# Patient Record
Sex: Female | Born: 1945
Health system: Southern US, Community
[De-identification: ages and names within clinical notes are randomized; demographics above are authoritative.]

## PROBLEM LIST (undated history)

## (undated) DIAGNOSIS — K219 Gastro-esophageal reflux disease without esophagitis: Secondary | ICD-10-CM

## (undated) DIAGNOSIS — E785 Hyperlipidemia, unspecified: Secondary | ICD-10-CM

## (undated) DIAGNOSIS — R0609 Other forms of dyspnea: Secondary | ICD-10-CM

## (undated) DIAGNOSIS — M810 Age-related osteoporosis without current pathological fracture: Secondary | ICD-10-CM

## (undated) DIAGNOSIS — K754 Autoimmune hepatitis: Secondary | ICD-10-CM

## (undated) DIAGNOSIS — K759 Inflammatory liver disease, unspecified: Secondary | ICD-10-CM

## (undated) DIAGNOSIS — I10 Essential (primary) hypertension: Secondary | ICD-10-CM

## (undated) DIAGNOSIS — R945 Abnormal results of liver function studies: Secondary | ICD-10-CM

## (undated) DIAGNOSIS — R7989 Other specified abnormal findings of blood chemistry: Secondary | ICD-10-CM

## (undated) DIAGNOSIS — R0600 Dyspnea, unspecified: Secondary | ICD-10-CM

## (undated) HISTORY — PX: LIVER BIOPSY: SHX301

## (undated) HISTORY — PX: ABDOMINAL HYSTERECTOMY: SHX81

---
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
## 2005-05-10 ENCOUNTER — Ambulatory Visit: Payer: Self-pay | Admitting: Obstetrics and Gynecology

## 2005-08-08 ENCOUNTER — Ambulatory Visit: Payer: Self-pay | Admitting: Unknown Physician Specialty

## 2006-06-22 ENCOUNTER — Ambulatory Visit: Payer: Self-pay | Admitting: Obstetrics and Gynecology

## 2007-07-03 ENCOUNTER — Ambulatory Visit: Payer: Self-pay | Admitting: Obstetrics and Gynecology

## 2008-09-23 ENCOUNTER — Ambulatory Visit: Payer: Self-pay | Admitting: Obstetrics and Gynecology

## 2009-06-22 ENCOUNTER — Ambulatory Visit: Payer: Self-pay | Admitting: Internal Medicine

## 2009-10-26 ENCOUNTER — Ambulatory Visit: Payer: Self-pay | Admitting: Obstetrics and Gynecology

## 2010-12-02 ENCOUNTER — Ambulatory Visit: Payer: Self-pay | Admitting: Obstetrics and Gynecology

## 2011-12-13 ENCOUNTER — Ambulatory Visit: Payer: Self-pay | Admitting: Obstetrics and Gynecology

## 2013-02-20 ENCOUNTER — Ambulatory Visit: Payer: Self-pay | Admitting: Obstetrics and Gynecology

## 2015-10-06 HISTORY — PX: COLONOSCOPY: SHX174

## 2016-07-18 DIAGNOSIS — K754 Autoimmune hepatitis: Secondary | ICD-10-CM

## 2016-07-18 HISTORY — DX: Autoimmune hepatitis: K75.4

## 2016-10-20 ENCOUNTER — Other Ambulatory Visit: Payer: Self-pay | Admitting: Internal Medicine

## 2016-10-20 DIAGNOSIS — Z1231 Encounter for screening mammogram for malignant neoplasm of breast: Secondary | ICD-10-CM

## 2016-11-15 ENCOUNTER — Encounter: Payer: Self-pay | Admitting: Radiology

## 2016-11-15 ENCOUNTER — Ambulatory Visit
Admission: RE | Admit: 2016-11-15 | Discharge: 2016-11-15 | Disposition: A | Discharge status: Home / Self Care | Payer: Medicare Other | Source: Ambulatory Visit | Attending: Internal Medicine | Admitting: Internal Medicine

## 2016-11-15 DIAGNOSIS — Z1231 Encounter for screening mammogram for malignant neoplasm of breast: Secondary | ICD-10-CM | POA: Diagnosis not present

## 2017-02-13 ENCOUNTER — Other Ambulatory Visit: Payer: Self-pay | Admitting: Nurse Practitioner

## 2017-02-13 DIAGNOSIS — K7689 Other specified diseases of liver: Secondary | ICD-10-CM

## 2017-02-13 DIAGNOSIS — R748 Abnormal levels of other serum enzymes: Secondary | ICD-10-CM

## 2017-02-13 DIAGNOSIS — M810 Age-related osteoporosis without current pathological fracture: Secondary | ICD-10-CM | POA: Insufficient documentation

## 2017-02-14 ENCOUNTER — Ambulatory Visit
Admission: RE | Admit: 2017-02-14 | Discharge: 2017-02-14 | Disposition: A | Discharge status: Home / Self Care | Payer: Medicare Other | Source: Ambulatory Visit | Attending: Nurse Practitioner | Admitting: Nurse Practitioner

## 2017-02-14 ENCOUNTER — Other Ambulatory Visit: Payer: Self-pay | Admitting: Nurse Practitioner

## 2017-02-14 DIAGNOSIS — R748 Abnormal levels of other serum enzymes: Secondary | ICD-10-CM

## 2017-02-14 DIAGNOSIS — R8290 Unspecified abnormal findings in urine: Secondary | ICD-10-CM | POA: Diagnosis present

## 2017-02-14 DIAGNOSIS — K7689 Other specified diseases of liver: Secondary | ICD-10-CM | POA: Insufficient documentation

## 2017-02-14 DIAGNOSIS — K824 Cholesterolosis of gallbladder: Secondary | ICD-10-CM | POA: Insufficient documentation

## 2017-02-14 DIAGNOSIS — N281 Cyst of kidney, acquired: Secondary | ICD-10-CM | POA: Diagnosis not present

## 2017-02-17 ENCOUNTER — Other Ambulatory Visit: Payer: Self-pay | Admitting: Nurse Practitioner

## 2017-02-17 ENCOUNTER — Ambulatory Visit
Admission: RE | Admit: 2017-02-17 | Discharge: 2017-02-17 | Disposition: A | Discharge status: Home / Self Care | Payer: Medicare Other | Source: Ambulatory Visit | Attending: Nurse Practitioner | Admitting: Nurse Practitioner

## 2017-02-17 DIAGNOSIS — K7689 Other specified diseases of liver: Secondary | ICD-10-CM

## 2017-02-17 DIAGNOSIS — R748 Abnormal levels of other serum enzymes: Secondary | ICD-10-CM | POA: Insufficient documentation

## 2017-02-17 DIAGNOSIS — K862 Cyst of pancreas: Secondary | ICD-10-CM | POA: Insufficient documentation

## 2017-02-17 DIAGNOSIS — K449 Diaphragmatic hernia without obstruction or gangrene: Secondary | ICD-10-CM

## 2017-02-17 MED ORDER — GADOBENATE DIMEGLUMINE 529 MG/ML IV SOLN
10.0000 mL | Freq: Once | INTRAVENOUS | Status: AC | PRN
  Administered 2017-02-17: 7 mL via INTRAVENOUS

## 2017-02-20 ENCOUNTER — Inpatient Hospital Stay
Admission: AD | Admit: 2017-02-20 | Discharge: 2017-02-22 | DRG: 442 | Disposition: A | Discharge status: Home / Self Care | Payer: Medicare Other | Source: Ambulatory Visit | Attending: Internal Medicine | Admitting: Internal Medicine

## 2017-02-20 ENCOUNTER — Ambulatory Visit: Payer: Medicare Other

## 2017-02-20 ENCOUNTER — Other Ambulatory Visit: Payer: Self-pay | Admitting: Nurse Practitioner

## 2017-02-20 DIAGNOSIS — R748 Abnormal levels of other serum enzymes: Secondary | ICD-10-CM

## 2017-02-20 DIAGNOSIS — K862 Cyst of pancreas: Secondary | ICD-10-CM | POA: Diagnosis present

## 2017-02-20 DIAGNOSIS — M810 Age-related osteoporosis without current pathological fracture: Secondary | ICD-10-CM | POA: Diagnosis present

## 2017-02-20 DIAGNOSIS — R319 Hematuria, unspecified: Secondary | ICD-10-CM | POA: Diagnosis present

## 2017-02-20 DIAGNOSIS — Z7982 Long term (current) use of aspirin: Secondary | ICD-10-CM

## 2017-02-20 DIAGNOSIS — R945 Abnormal results of liver function studies: Secondary | ICD-10-CM | POA: Diagnosis present

## 2017-02-20 DIAGNOSIS — Z87440 Personal history of urinary (tract) infections: Secondary | ICD-10-CM | POA: Diagnosis not present

## 2017-02-20 DIAGNOSIS — E876 Hypokalemia: Secondary | ICD-10-CM | POA: Diagnosis present

## 2017-02-20 DIAGNOSIS — D696 Thrombocytopenia, unspecified: Secondary | ICD-10-CM | POA: Diagnosis present

## 2017-02-20 DIAGNOSIS — K7689 Other specified diseases of liver: Secondary | ICD-10-CM | POA: Diagnosis present

## 2017-02-20 DIAGNOSIS — K754 Autoimmune hepatitis: Principal | ICD-10-CM | POA: Diagnosis present

## 2017-02-20 DIAGNOSIS — I10 Essential (primary) hypertension: Secondary | ICD-10-CM | POA: Diagnosis present

## 2017-02-20 DIAGNOSIS — Z882 Allergy status to sulfonamides status: Secondary | ICD-10-CM | POA: Diagnosis not present

## 2017-02-20 DIAGNOSIS — R7989 Other specified abnormal findings of blood chemistry: Secondary | ICD-10-CM | POA: Diagnosis present

## 2017-02-20 HISTORY — DX: Essential (primary) hypertension: I10

## 2017-02-20 LAB — COMPREHENSIVE METABOLIC PANEL
ALT: 365 U/L — AB (ref 14–54)
AST: 862 U/L — AB (ref 15–41)
Albumin: 3.1 g/dL — ABNORMAL LOW (ref 3.5–5.0)
Alkaline Phosphatase: 88 U/L (ref 38–126)
Anion gap: 8 (ref 5–15)
BILIRUBIN TOTAL: 7.9 mg/dL — AB (ref 0.3–1.2)
BUN: 22 mg/dL — AB (ref 6–20)
CHLORIDE: 108 mmol/L (ref 101–111)
CO2: 25 mmol/L (ref 22–32)
CREATININE: 1.13 mg/dL — AB (ref 0.44–1.00)
Calcium: 9.5 mg/dL (ref 8.9–10.3)
GFR, EST AFRICAN AMERICAN: 55 mL/min — AB (ref >60)
GFR, EST NON AFRICAN AMERICAN: 48 mL/min — AB (ref >60)
Glucose, Bld: 119 mg/dL — ABNORMAL HIGH (ref 65–99)
POTASSIUM: 3.5 mmol/L (ref 3.5–5.1)
Sodium: 141 mmol/L (ref 135–145)
TOTAL PROTEIN: 8 g/dL (ref 6.5–8.1)

## 2017-02-20 MED ORDER — ACETAMINOPHEN 650 MG RE SUPP: 650.0000 mg | Freq: Four times a day (QID) | RECTAL | Status: DC | PRN

## 2017-02-20 MED ORDER — ONDANSETRON HCL 4 MG PO TABS: 4.0000 mg | ORAL_TABLET | Freq: Four times a day (QID) | ORAL | Status: DC | PRN

## 2017-02-20 MED ORDER — TRAZODONE HCL 50 MG PO TABS
25.0000 mg | ORAL_TABLET | Freq: Every evening | ORAL | Status: DC | PRN
  Administered 2017-02-20: 25 mg via ORAL
  Filled 2017-02-20: qty 1

## 2017-02-20 MED ORDER — HYDROCODONE-ACETAMINOPHEN 5-325 MG PO TABS
1.0000 | ORAL_TABLET | ORAL | Status: DC | PRN
  Administered 2017-02-21: 1 via ORAL
  Filled 2017-02-20: qty 1

## 2017-02-20 MED ORDER — DOCUSATE SODIUM 100 MG PO CAPS
100.0000 mg | ORAL_CAPSULE | Freq: Two times a day (BID) | ORAL | Status: DC
  Administered 2017-02-20 – 2017-02-22 (×3): 100 mg via ORAL
  Filled 2017-02-20 (×3): qty 1

## 2017-02-20 MED ORDER — ACETAMINOPHEN 325 MG PO TABS: 650.0000 mg | ORAL_TABLET | Freq: Four times a day (QID) | ORAL | Status: DC | PRN

## 2017-02-20 MED ORDER — ONDANSETRON HCL 4 MG/2ML IJ SOLN
4.0000 mg | Freq: Four times a day (QID) | INTRAMUSCULAR | Status: DC | PRN
  Administered 2017-02-21: 4 mg via INTRAVENOUS
  Filled 2017-02-20: qty 2

## 2017-02-20 NOTE — Consult Note (Signed)
GASTROENTEROLOGY ESTABLISHED PATIENT VISIT   Patient Profile: [name] [name] is a 71 y.o. female who presents for a return visit to the GI clinic for follow up of elevated liver enzymes, abnormal serologic evaluation, and abnormal MRI of liver/pancreas.  PCP:  Margaretann Loveless, MD  Brief History   Outside records: - 04/11/16: AST 17, ALT 14, ALP 57, T bili 0.7, albumin 4.2. - 10/12/16:TSH 1.53  - 01/19/17: AST 571, ALT 475, ALP 100, T bili 1.5, albumin 3.9, BUN 22, Cr 0.99, NA 142, K3.8, Ca9.3; hepatitis a antibody IgM negative, hepatitis B surface antigen screen negative, hepatitis B core antibody IgM negative, hepatitis C virus AB neg, total cholesterol 124 was writes 124 HDL 31 LDL 68. - 01/26/17: AST 619, ALT 420, ALP 109, GGT 441, T bili 3.2, LDH 339. - 02/10/17: AST 524,ALT 268, ALP 104, GGT 490, T bili 5.2 (total 4.9, direct 2.98, indirect 1.92), LDH 302.  [facility]: - 02/13/17: CC: abnormal LFTs. Mild nausea w/o vomiting and red urine noted.  Last CSY in 07/2005 - unremarkable.              - Pertinent labs : AST 505, ALT 249, T bili 6.7, conjugated bili 4.2, albumin 3.4, INR 1.3, ASMA 78, AMA 126.9, total IgG 2320, serum IgA 694, total IgE 264, AFP 56.2, Cr 1.3, GFR 40; UA + protein (100), glucose (100), ketones (15), moderate blood, + nitrite, moderate leuk esterase, + WBCs (>50), + RBCs (4-10), + bacteria (moderate), culture + E. Coli.              - Normal labs: HBV panel. - 02/14/17: [place] abd complete w/ doppler - several hepatic cysts (some simple, at least 1 with solid element), multiple gallbladder polyps (largest 8mm, indeterminant), borderline splenomegaly, ectatitc abdominal aorta (max diameter 2.6 cm); recommend MRI and repeat US in 5 years for aorta. - 02/15/17: Pertinent labs: platelets 135, ammonia 132, CA 19-9 220; ANA              - Normal labs: BMP, ANA, ceruloplasmin, alpha-1-antitripysin phenotype, lipase. - 02/17/17: MRI abdomen w/ MRCP - numerous benign-appearing  hepatic cysts, 1.7cm simple cyst in posterior pancreatic head (ddx: indolent cystic neoplasm like IPMN/pseudocyst), moderate hiatal hernia, probably tiny gallstone; repeat MRI in 6 months recommended to f/up pancreatic cyst.  Interval History   [name] returns for follow-up of elevated LFTs.  STAT labs were obtained prior to today's visit:  - AST 719, ALT 344, T bili 7.4, conjugated bili 4.4, albumin 3.2. - K 3.3, GFR 55. - INR 1.3. - Platelets 145.  Patient denies a known personal or family history of liver disease.  FHx is notable for gastric ulcer in father, s/p gastrectomy, due to EtOH abuse.  No significant EtOH use or tobacco use.  Patient previously took acetaminophen as needed for headaches on a very rare basis.  She also discontinued vitamin B, C, and D supplementation when LFTs initially became elevated.  One BP medication (unsure of name) was discontinued after she lost 5 lbs intentionally.  Otherwise, no new medications, including antibiotics, or OTC supplements.  No recent illnesses within the last 2-3 months.   Also no history of IV drug use, intranasal cocaine use, tattoos, incarceration, or prior blood transfusions prior to 1992.  No liver-related symptoms including abdominal swelling, LE swelling, jaundice, itching, confusion, rectal bleeding, and dark stools.  Patient notes mild fatigue, "cloudiness", and unsteadiness on her feet.  She also denies additional GI concerns like  unexplained weight loss, appetite changes, dysphagia, heartburn/reflux, vomiting, or change in bowel habits.   Problem List:         Problem List  Date Reviewed: 02/14/2017         Noted   Abnormal results of liver function studies 02/13/2017   Allergic rhinitis due to allergen 02/13/2017   Senile osteoporosis 02/13/2017   Mixed hyperlipidemia 02/13/2017   Essential hypertension 02/13/2017   History of hypertension 02/13/2017   Benign neoplasm of skin of trunk 02/13/2017       Medications:  CurrentMedications        Current Outpatient Prescriptions  Medication Sig Dispense Refill  . aspirin 81 MG EC tablet Take 81 mg by mouth once daily.    . benazepril (LOTENSIN) 40 MG tablet Take 40 mg by mouth once daily.    . metoprolol succinate (TOPROL-XL) 50 MG XL tablet Take 50 mg by mouth once daily.     No current facility-administered medications for this visit.        Allergies:  Sulfa (sulfonamide antibiotics)  Past Medical History:      Past Medical History:  Diagnosis Date  . Abnormal results of liver function studies   . Allergic rhinitis due to allergen   . Benign neoplasm of skin of trunk   . Bronchitis   . Essential hypertension   . History of hypertension   . Mixed hyperlipidemia   . Senile osteoporosis     Social History:  Social History        Social History  . Marital status: Married    Spouse name: N/A  . Number of children: N/A  . Years of education: N/A       Social History Main Topics  . Smoking status: Never Smoker  . Smokeless tobacco: Never Used  . Alcohol use No  . Drug use: No  . Sexual activity: Not Asked       Other Topics Concern  . None      Social History Narrative  . None    Family History:       Family History  Problem Relation Age of Onset  . Lung cancer Mother   . No Known Problems Daughter      Review of Systems   General: negative for - fever, chills, weight gain, weight loss, fatigue, weakness, depression, anxiety Head: no injury, migraines, headaches Eyes: no jaundice, itching, dryness, tearing, redness, vision changes Nose: no injury, bleeding Mouth/Throat: no oral ulcers, swollen neck, dry mouth, sore throat, hoarseness Endocrine: no heat/cold intolerance Respiratory: no cough, wheezing, SOB Cardiovascular: no chest pain, palpitations GI: see HPI Musculoskeletal: no joint swelling, muscle/joint pain  Neurological: no seizures,  syncope, dizziness, numbness/tingling Skin: no rashes, itching Hematological and Lymphatic: no easy bruising, easy bleeding   Physical Examination      Vitals:   02/20/17 1550  BP: (!) 173/98  Pulse: 73  Temp: 36.8 C (98.3 F)   Body mass index is 29.22 kg/m. Weight: 67.9 kg (149 lb 9.6 oz)      Wt Readings from Last 3 Encounters:  02/20/17 67.9 kg (149 lb 9.6 oz)  02/13/17 68.3 kg (150 lb 9.6 oz)    General Appearance:    Friendly, WDWN female no distress, sitting comfortably on exam room bench; husband at bedside.  Head:     Atraumatic, normocephalic  Eyes:   Anciteric  Neck:   No lymphadenopathy noted, symmetrical, no JVD  Mouth:   Lips, mucosa, and tongue normal;  Lungs:     Clear to auscultation bilaterally, no wheezes/crackles/rales   Heart:    Regular rate and rhythm, S1 and S2 normal, no murmur, rub   or gallop  Abdomen:  Rectal:     Soft, non-tender, bowel sounds active all four quadrants,         no rebound/guarding, no masses, no organomegaly   Deferred  Extremities:   Extremities normal, atraumatic, no cyanosis or edema  Skin:   + mild jaundice, + palmar erythema on ulner aspect of hands bilaterally, mild angioma formation along chest No abdominal rashes or lesions noted   Neurologic:   Moves all extremities well. Alert and oriented x 3.   Review of Data   I have reviewed the following data today:  Provider notes: Dr. Welton Flakes (PCP) Fransico Setters, NP (GI)  Labs:      Lab Results  Component Value Date   WBC 5.4 02/20/2017   HGB 13.9 02/20/2017   HCT 42.2 02/20/2017   MCV 87.6 02/20/2017   PLT 145 (L) 02/20/2017   @LABRCNTIP (hgb:3)@      Lab Results  Component Value Date   NA 139 02/20/2017   K 3.3 (L) 02/20/2017   CL 107 02/20/2017   CO2 25.5 02/20/2017   BUN 18 02/20/2017   CREATININE 1.0 02/20/2017        Lab Results  Component Value Date   ALT 344 (H) 02/20/2017   AST 719 (H) 02/20/2017   ALKPHOS 94  02/20/2017   @LABRCNTIP (aptt,inr,ptt)@  Imaging: MRI abdomen + MRCP w/ and w/o contrast - 02/17/17: FINDINGS: Lower chest: Tiny right pleural effusion.Moderate hiatal hernia.  Hepatobiliary: Numerous benign-appearing cysts are seen throughout the right and left lobes. No solid liver mass identified. No evidence of biliary ductal dilatation, with common bile duct measuring 4 mm. No evidence of choledocholithiasis or biliary stricture. Tiny sub-cm filling defect in the gallbladder is suspicious for a tiny gallstone.  Pancreas: No evidence of pancreatic ductal dilatation or pancreas divisum. A simple appearing cyst measuring 1.7 cm is seen in the posterior pancreatic head on image 16/5. No definite communication with main pancreatic duct visualized.  Spleen:Within normal limits in size and appearance.  Adrenals/Urinary Tract: No masses identified. Tiny sub-cm renal cysts noted. No evidence of hydronephrosis.  Stomach/Bowel: Visualized portions within the abdomen are unremarkable.  Vascular/Lymphatic: No pathologically enlarged lymph nodes identified. No abdominal aortic aneurysm.  Other:None.  Musculoskeletal:No suspicious bone lesions identified.  IMPRESSION: Numerous benign-appearing hepatic cysts. No evidence of hepatic mass or biliary ductal dilatation.  1.7 cm simple appearing cyst in posterior pancreatic head, with differential diagnosis including indolent cystic neoplasm, such as side-branch IPMN, and pseudocyst. Recommend continued followup by MRI in 6 months. This recommendation follows ACR consensus guidelines: Management of Incidental Pancreatic Cysts: A White Paper of the ACR Incidental Findings Committee. J Am Coll Radiol 2017;14:911-923.  Moderate hiatal hernia.  Probable tiny gallstone. No radiographic evidence of cholecystitis or biliary dilatation.  Procedures: See brief history.  Also see HPI.  Assessment/Plan   Ms. Wanninger is a 71  y.o. female with a PMHx of hypertension was seen today for follow up of:  1. Elevated LFTs Evaluation thus far is notable for persistently elevated AST (517 > 719) and ALT (473 > 344) since 01/19/17.  Previous LFTs in 09/2016 were WNL.  Pertinent findings in her serologic evaluation include elevated ASMA (78), AMA (126.9), total IgG (2,320), serum IgA (694), total IgE (264), and AFP (56.2).  US abdomen w/ doppler on  02/14/17 demonstrated several hepatic cysts with normal bile ducts and flow.  Subsequent MRI w/ MRCP noted a 1.7cm pancreatic cyst (see below), several hepatic cysts, and unremarkable bile ducts.  At this time, DDx includes autoimmune hepatitis vs bilary etiology like cholangiocarcinoma.  US-guided liver biopsy was recommended to obtain tissue diagnosis.  However, the first available outpatient appointment is Wednesday 8/15.  Therefore, Dr. Marva Panda recommends direct admission for more immediate evaluation given patient continued rise in LFTs, bilirubin, and ammonia.  Page has been placed on the on-call Internal Medicine physician for direct admission.  Will closely follow as outpatient once biopsy obtained and patient stable for discharge.  2. Elevated bilirubin See evaluation as noted above.  Total bilirubin has risen from 1.5 > 7.4, with conjugated bilirubin 2.98 > 4.40).  Aside from posterior pancreatic head cyst without side branch invasion, biliary system appeared normal on [place] w/ doppler and MRI w/ MRCP.  DDx as above.  3. Thrombocytopenia, unspecified (CMS-HCC) Platelets 135-145.  No overt bleeding.  Will continue to monitor.  4. Pancreatic cyst MRI w/ MRCP noted a 1.7cm pancreatic cyst without definite communication with main pancreatic duct.  This is most consistent with indolent cystic neoplasm like IPMN or pseudocyst.  CA 19-9 also elevated at 220.  Will continue to evaluate as above, and monitor pancreatic cyst in 6 months per  ACR consensus guidelines: Management of Incidental  Pancreatic Cysts: A White Paper of the ACR Incidental Findings Committee. J Am Coll Radiol 2017;14:911-923.  5. Hepatic encephalopathy Ammonia 132 on 02/15/17.  "Cloudiness", unsteadiness, and fatigue noted.  No asterixis.  - Outpatient management pending admission/liver biopsy findings.  6. UTI with hematuria UA obtained on 7/30 consistent with UTI.  Culture + E coli.  Patient was treated with a course of ampicillin x 3 days, but continues to report hematuria first thing in the morning.  Will discontinue ampicillin and defer to inpatient management.

## 2017-02-20 NOTE — Consult Note (Addendum)
Patient is a 71 yo female admitted from outpatient clinic with increasing liver enzyme elevations. Please see full GI consult by Ms Laural BenesJohnson.     Patient initially seen in the o/p clinic one week ago referred for evaluation  of abnormal lfts.  Extensive evaluation including multiple labs and MRI/MRCP done in the interim.  No apparent drug related reaction.  No previous liver history of hepatitis, family history of liver disease or cirrhosis, no travel.  Labs indicate elevated ASMA and AMA and IgG is elevated, with IgM normal as is ANA.  Abnormal CA19-9 although MRI shows no mass but possible side branch IPMT.  No evidence of obstruction.    Concern for autoimmune hepatitis.  Unable to get liver biopsy within the next 10-14 days as outpatient, admission for further evaluation and consideration of percutaneous liver biopsy.     [facility].  Case discussed with Dr Amado CoeGouru, IM hospitalist.

## 2017-02-20 NOTE — H&P (Addendum)
[facility] - [name] [name] Guerra at [facility]   PATIENT NAME: [name] [name]    MR#:  [id number]  DATE OF BIRTH:  [birth year]  DATE OF ADMISSION:  02/20/2017  PRIMARY CARE PHYSICIAN: [name] Loveless, MD   REQUESTING/REFERRING PHYSICIAN: Dr. Marva [name]  CHIEF COMPLAINT:  No chief complaint on file. Abnormal LFTs  HISTORY OF PRESENT ILLNESS:  [name] [name]  is a 71 y.o. female with a known history of Hypertension hypertension sent in from Dr. Marva [name] s office for liver biopsy. Patient referred to GI office for abnormal liver function testing. And patient was evaluated by endocrinology including hepatitis workup, MRCP which are essentially unremarkabl. And patient may possibly have autoimmune hepatitis for that she needs liver biopsy. We're admitting her for need for liver biopsy, intervention radiology to do a liver biopsy tomorrow morning. And gastroenterology will follow the patient. Patient denies any complaints except slight itching.  PAST MEDICAL HISTORY:  No past medical history on file.  PAST SURGICAL HISTOIRY:  No past surgical history on file.  SOCIAL HISTORY:   Social History  Substance Use Topics  . Smoking status: Not on file  . Smokeless tobacco: Not on file  . Alcohol use Not on file    FAMILY HISTORY:  No family history on file.  DRUG ALLERGIES:  Allergies not on file  REVIEW OF SYSTEMS:  CONSTITUTIONAL: No fever, fatigue or weakness.  EYES: No blurred or double vision.  EARS, NOSE, AND THROAT: No tinnitus or ear pain.  RESPIRATORY: No cough, shortness of breath, wheezing or hemoptysis.  CARDIOVASCULAR: No chest pain, orthopnea, edema.  GASTROINTESTINAL: No nausea, vomiting, diarrhea or abdominal pain.  GENITOURINARY: No dysuria, hematuria.  ENDOCRINE: No polyuria, nocturia,  HEMATOLOGY: No anemia, easy bruising or bleeding SKIN: No rash or lesion. MUSCULOSKELETAL: No joint pain or arthritis.   NEUROLOGIC: No tingling, numbness, weakness.   PSYCHIATRY: No anxiety or depression.   MEDICATIONS AT HOME:   Prior to Admission medications   Not on File      VITAL SIGNS:  There were no vitals taken for this visit.  PHYSICAL EXAMINATION:  GENERAL:  71 y.o.-year-old patient lying in the bed with no acute distress.  EYES: Pupils equal, round, reactive to light and accommodation. No scleral icterus. Extraocular muscles intact.  HEENT: Head atraumatic, normocephalic. Oropharynx and nasopharynx clear.  NECK:  Supple, no jugular venous distention. No thyroid enlargement, no tenderness.  LUNGS: Normal breath sounds bilaterally, no wheezing, rales,rhonchi or crepitation. No use of accessory muscles of respiration.  CARDIOVASCULAR: S1, S2 normal. No murmurs, rubs, or gallops.  ABDOMEN: Soft, nontender, nondistended. Bowel sounds present. No organomegaly or mass.  EXTREMITIES: No pedal edema, cyanosis, or clubbing.  NEUROLOGIC: Cranial nerves II through XII are intact. Muscle strength 5/5 in all extremities. Sensation intact. Gait not checked.  PSYCHIATRIC: The patient is alert and oriented x 3.  SKIN: No obvious rash, lesion, or ulcer.   LABORATORY PANEL:   CBC No results for input(s): WBC, HGB, HCT, PLT in the last 168 hours. ------------------------------------------------------------------------------------------------------------------  Chemistries  No results for input(s): NA, K, CL, CO2, GLUCOSE, BUN, CREATININE, CALCIUM, MG, AST, ALT, ALKPHOS, BILITOT in the last 168 hours.  Invalid input(s): GFRCGP ------------------------------------------------------------------------------------------------------------------  Cardiac Enzymes No results for input(s): TROPONINI in the last 168 hours. ------------------------------------------------------------------------------------------------------------------  RADIOLOGY:  No results found.  EKG:  No orders found for this or any previous visit.  IMPRESSION AND PLAN:   #1.  Abnormal liver function tests with elevated AST, ALT progressive increase  levels since July 5. Patient's serological workup including a MBA, total IgE, serum IgA, AFP A as They are elevated. Ultrasound abdomen with Doppler showed hepatic cyst with normal bile ducts. MRCP showed pancreatic cyst but common bile ducts are normal. Differential diagnoses includes autoimmune hepatitis versus possible cholangiocarcinoma. Patient needs liver biopsy for tissue diagnosis and further treatment options. And patient could not get liver biopsy till August 15 as an outpatient. So Dr.Skulskie wanted to admit patient  for liver biopsy sooner  #2 elevated  Bilirubin;: Total bilirubin elevated from 1.5-7.4. #3. thrombocytopenia without any overt bleeding: Continue to monitor. UTI with hematuria UA obtained on 7/30 consistent with UTI. Culture + E coli. Patient was treated with a course of ampicillin x 3 days, but continues to report hematuria first thing in the morning. check UA again.  All the records are reviewed and case discussed with ED provider. Management plans discussed with the patient, family and they are in agreement.  CODE STATUS: full  TOTAL TIME TAKING CARE OF THIS PATIENT: 55 minutes.    Katha HammingKONIDENA,[name] M.D on 02/20/2017 at 6:34 PM  Between 7am to 6pm - Pager - [phone]  After 6pm go to [email or web address] - password [email or web address] [facility]  [facility]  Office  [phone]  CC: Primary care physician; [name] [name], [name] S, MD  Note: This dictation was prepared with Dragon dictation along with smaller phrase technology. Any transcriptional errors that result from this process are unintentional.

## 2017-02-21 ENCOUNTER — Inpatient Hospital Stay: Payer: Medicare Other

## 2017-02-21 LAB — BASIC METABOLIC PANEL
Anion gap: 4 — ABNORMAL LOW (ref 5–15)
BUN: 21 mg/dL — AB (ref 6–20)
CALCIUM: 8.8 mg/dL — AB (ref 8.9–10.3)
CO2: 24 mmol/L (ref 22–32)
CREATININE: 1.01 mg/dL — AB (ref 0.44–1.00)
Chloride: 110 mmol/L (ref 101–111)
GFR calc Af Amer: >60 mL/min (ref >60)
GFR, EST NON AFRICAN AMERICAN: 55 mL/min — AB (ref >60)
GLUCOSE: 107 mg/dL — AB (ref 65–99)
Potassium: 3.3 mmol/L — ABNORMAL LOW (ref 3.5–5.1)
SODIUM: 138 mmol/L (ref 135–145)

## 2017-02-21 LAB — URINALYSIS, ROUTINE W REFLEX MICROSCOPIC
BACTERIA UA: NONE SEEN
GLUCOSE, UA: NEGATIVE mg/dL
Hgb urine dipstick: NEGATIVE
Ketones, ur: NEGATIVE mg/dL
NITRITE: NEGATIVE
PROTEIN: NEGATIVE mg/dL
SPECIFIC GRAVITY, URINE: 1.024 (ref 1.005–1.030)
pH: 5 (ref 5.0–8.0)

## 2017-02-21 LAB — CBC
HEMATOCRIT: 35.2 % (ref 35.0–47.0)
Hemoglobin: 12 g/dL (ref 12.0–16.0)
MCH: 28.8 pg (ref 26.0–34.0)
MCHC: 34.2 g/dL (ref 32.0–36.0)
MCV: 84.2 fL (ref 80.0–100.0)
PLATELETS: 105 10*3/uL — AB (ref 150–440)
RBC: 4.18 MIL/uL (ref 3.80–5.20)
RDW: 22.6 % — AB (ref 11.5–14.5)
WBC: 4.9 10*3/uL (ref 3.6–11.0)

## 2017-02-21 LAB — GLUCOSE, CAPILLARY: Glucose-Capillary: 84 mg/dL (ref 65–99)

## 2017-02-21 LAB — PROTIME-INR
INR: 1.48
Prothrombin Time: 18.1 seconds — ABNORMAL HIGH (ref 11.4–15.2)

## 2017-02-21 LAB — APTT: APTT: 37 s — AB (ref 24–36)

## 2017-02-21 MED ORDER — POTASSIUM CHLORIDE CRYS ER 20 MEQ PO TBCR
30.0000 meq | EXTENDED_RELEASE_TABLET | Freq: Once | ORAL | Status: AC
  Administered 2017-02-21: 30 meq via ORAL
  Filled 2017-02-21: qty 1

## 2017-02-21 MED ORDER — METOPROLOL TARTRATE 25 MG PO TABS: 12.5000 mg | ORAL_TABLET | Freq: Two times a day (BID) | ORAL | Status: DC

## 2017-02-21 MED ORDER — FENTANYL CITRATE (PF) 100 MCG/2ML IJ SOLN
INTRAMUSCULAR | Status: AC
  Filled 2017-02-21: qty 2

## 2017-02-21 MED ORDER — SODIUM CHLORIDE 0.9 % IV SOLN
INTRAVENOUS | Status: AC | PRN
  Administered 2017-02-21: 10 mL/h via INTRAVENOUS

## 2017-02-21 MED ORDER — DEXTROSE 5 % IV SOLN
1.0000 g | INTRAVENOUS | Status: DC
  Administered 2017-02-21 – 2017-02-22 (×2): 1 g via INTRAVENOUS
  Filled 2017-02-21 (×2): qty 10

## 2017-02-21 MED ORDER — BENAZEPRIL HCL 20 MG PO TABS
40.0000 mg | ORAL_TABLET | Freq: Every day | ORAL | Status: DC
  Administered 2017-02-22: 40 mg via ORAL
  Filled 2017-02-21: qty 2
  Filled 2017-02-21: qty 1

## 2017-02-21 MED ORDER — METOPROLOL TARTRATE 25 MG PO TABS
25.0000 mg | ORAL_TABLET | Freq: Two times a day (BID) | ORAL | Status: DC
  Administered 2017-02-21 – 2017-02-22 (×2): 25 mg via ORAL
  Filled 2017-02-21 (×2): qty 1

## 2017-02-21 MED ORDER — MIDAZOLAM HCL 2 MG/2ML IJ SOLN
INTRAMUSCULAR | Status: AC
  Filled 2017-02-21: qty 4

## 2017-02-21 NOTE — Procedures (Signed)
US liver biopsy without difficulty  Complications:  None  Blood Loss: none  See dictation in canopy pacs  

## 2017-02-21 NOTE — Consult Note (Signed)
Subjective: Patient seen for abnormal liver testing. Patient was admitted yesterday to help arrange for a timely percutaneous liver biopsy. This was concluded this earlier this afternoon.. I have requested that this pathology be done stat basis. Patient tolerated the procedure quite well. She denies any problems with vomiting although there is some intermittent nausea. However her appetite is good. There is no abdominal pain. She's had no bowel movement since she is coming to the hospital but usually has a bowel movement every one to 2 days. There is no pain on deep inspiration.  Objective: Vital signs in last 24 hours: Temp:  [97.8 F (36.6 C)-98.6 F (37 C)] 98.3 F (36.8 C) (08/07 0448) Pulse Rate:  [70-75] 70 (08/07 1400) Resp:  [17-23] 23 (08/07 1400) BP: (130-157)/(58-91) 133/65 (08/07 1400) SpO2:  [91 %-99 %] 93 % (08/07 1400) Weight:  [66.7 kg (147 lb 0.8 oz)-70.3 kg (154 lb 15.7 oz)] 70.3 kg (154 lb 15.7 oz) (08/07 0448) Blood pressure 133/65, pulse 70, temperature 98.3 F (36.8 C), temperature source Oral, resp. rate (!) 23, height 5\' 3"  (1.6 m), weight 70.3 kg (154 lb 15.7 oz), SpO2 93 %.   Intake/Output from previous day: 08/06 0701 - 08/07 0700 In: 240 [P.O.:240] Out: 400 [Urine:400]  Intake/Output this shift: Total I/O In: -  Out: 200 [Urine:200]   General appearance:  A 71 year old female no acute distress, minimal jaundice/scleral icterus Resp:  Bilaterally clear to auscultation Cardio:  Regular rate and rhythm without rub or gallop GI:  Bilaterally clear to auscultation Extremities:  Soft nontender nondistended bowel sounds positive normoactive.   Lab Results: Results for orders placed or performed during the hospital encounter of 02/20/17 (from the past 24 hour(s))  Urinalysis, Routine w reflex microscopic     Status: Abnormal   Collection Time: 02/20/17  6:42 PM  Result Value Ref Range   Color, Urine AMBER (A) YELLOW   APPearance HAZY (A) CLEAR   Specific  Gravity, Urine 1.024 1.005 - 1.030   pH 5.0 5.0 - 8.0   Glucose, UA NEGATIVE NEGATIVE mg/dL   Hgb urine dipstick NEGATIVE NEGATIVE   Bilirubin Urine SMALL (A) NEGATIVE   Ketones, ur NEGATIVE NEGATIVE mg/dL   Protein, ur NEGATIVE NEGATIVE mg/dL   Nitrite NEGATIVE NEGATIVE   Leukocytes, UA MODERATE (A) NEGATIVE   RBC / HPF 6-30 0 - 5 RBC/hpf   WBC, UA TOO NUMEROUS TO COUNT 0 - 5 WBC/hpf   Bacteria, UA NONE SEEN NONE SEEN   Squamous Epithelial / LPF 0-5 (A) NONE SEEN   Mucous PRESENT    Hyaline Casts, UA PRESENT   Comprehensive metabolic panel     Status: Abnormal   Collection Time: 02/20/17  7:39 PM  Result Value Ref Range   Sodium 141 135 - 145 mmol/L   Potassium 3.5 3.5 - 5.1 mmol/L   Chloride 108 101 - 111 mmol/L   CO2 25 22 - 32 mmol/L   Glucose, Bld 119 (H) 65 - 99 mg/dL   BUN 22 (H) 6 - 20 mg/dL   Creatinine, Ser 1.61 (H) 0.44 - 1.00 mg/dL   Calcium 9.5 8.9 - 09.6 mg/dL   Total Protein 8.0 6.5 - 8.1 g/dL   Albumin 3.1 (L) 3.5 - 5.0 g/dL   AST 045 (H) 15 - 41 U/L   ALT 365 (H) 14 - 54 U/L   Alkaline Phosphatase 88 38 - 126 U/L   Total Bilirubin 7.9 (H) 0.3 - 1.2 mg/dL   GFR calc non Af  Amer 48 (L) >60 mL/min   GFR calc Af Amer 55 (L) >60 mL/min   Anion gap 8 5 - 15  Basic metabolic panel     Status: Abnormal   Collection Time: 02/21/17  5:10 AM  Result Value Ref Range   Sodium 138 135 - 145 mmol/L   Potassium 3.3 (L) 3.5 - 5.1 mmol/L   Chloride 110 101 - 111 mmol/L   CO2 24 22 - 32 mmol/L   Glucose, Bld 107 (H) 65 - 99 mg/dL   BUN 21 (H) 6 - 20 mg/dL   Creatinine, Ser 1.471.01 (H) 0.44 - 1.00 mg/dL   Calcium 8.8 (L) 8.9 - 10.3 mg/dL   GFR calc non Af Amer 55 (L) >60 mL/min   GFR calc Af Amer >60 >60 mL/min   Anion gap 4 (L) 5 - 15  CBC     Status: Abnormal   Collection Time: 02/21/17  5:10 AM  Result Value Ref Range   WBC 4.9 3.6 - 11.0 K/uL   RBC 4.18 3.80 - 5.20 MIL/uL   Hemoglobin 12.0 12.0 - 16.0 g/dL   HCT 82.935.2 56.235.0 - 13.047.0 %   MCV 84.2 80.0 - 100.0 fL    MCH 28.8 26.0 - 34.0 pg   MCHC 34.2 32.0 - 36.0 g/dL   RDW 86.522.6 (H) 78.411.5 - 69.614.5 %   Platelets 105 (L) 150 - 440 K/uL  Glucose, capillary     Status: None   Collection Time: 02/21/17  8:22 AM  Result Value Ref Range   Glucose-Capillary 84 65 - 99 mg/dL  Protime-INR     Status: Abnormal   Collection Time: 02/21/17  9:16 AM  Result Value Ref Range   Prothrombin Time 18.1 (H) 11.4 - 15.2 seconds   INR 1.48   APTT     Status: Abnormal   Collection Time: 02/21/17  9:16 AM  Result Value Ref Range   aPTT 37 (H) 24 - 36 seconds      Recent Labs  02/21/17 0510  WBC 4.9  HGB 12.0  HCT 35.2  PLT 105*   BMET  Recent Labs  02/20/17 1939 02/21/17 0510  NA 141 138  K 3.5 3.3*  CL 108 110  CO2 25 24  GLUCOSE 119* 107*  BUN 22* 21*  CREATININE 1.13* 1.01*  CALCIUM 9.5 8.8*   LFT  Recent Labs  02/20/17 1939  PROT 8.0  ALBUMIN 3.1*  AST 862*  ALT 365*  ALKPHOS 88  BILITOT 7.9*   PT/INR  Recent Labs  02/21/17 0916  LABPROT 18.1*  INR 1.48   Hepatitis Panel No results for input(s): HEPBSAG, HCVAB, HEPAIGM, HEPBIGM in the last 72 hours. C-Diff No results for input(s): CDIFFTOX in the last 72 hours. No results for input(s): CDIFFPCR in the last 72 hours.   Studies/Results: [place] Biopsy  Result Date: 02/21/2017 INDICATION: Elevated LFTs EXAM: ULTRASOUND GUIDED LIVER BIOPSY MEDICATIONS: None. ANESTHESIA/SEDATION: None FLUOROSCOPY TIME:  Not applicable COMPLICATIONS: None immediate. PROCEDURE: Informed written consent was obtained from the patient after a thorough discussion of the procedural risks, benefits and alternatives. All questions were addressed. Maximal Sterile Barrier Technique was utilized including caps, mask, sterile gowns, sterile gloves, sterile drape, hand hygiene and skin antiseptic. A timeout was performed prior to the initiation of the procedure. Utilizing 1% xylocaine as local anesthetic and real-time ultrasound guidance a 17 gauge guiding needle was  placed percutaneously into the left lobe of the liver. Hard copy imaging was obtained to document  needle placement. Subsequently multiple 18 gauge core biopsies were obtained without difficulty under real-time ultrasound guidance. Imaging of the needle passes was obtained as well. The guiding needle was then removed and Gel-Foam slurry placed to aid in hemostasis. The patient tolerated the procedure well was returned her room in satisfactory condition. IMPRESSION: Successful ultrasound guided liver biopsy as described. Electronically Signed   By: Alcide Clever M.D.   On: 02/21/2017 13:27    Scheduled Inpatient Medications:   . benazepril  40 mg Oral Daily  . docusate sodium  100 mg Oral BID  . metoprolol tartrate  25 mg Oral BID    Continuous Inpatient Infusions:   . cefTRIAXone (ROCEPHIN)  IV Stopped (02/21/17 0919)    PRN Inpatient Medications:  acetaminophen **OR** acetaminophen, HYDROcodone-acetaminophen, ondansetron **OR** ondansetron (ZOFRAN) IV, traZODone  Miscellaneous:   Assessment:  1. Abnormal liver associated enzymes. Her liver enzymes/hepatocellular continue June 2 elevate and her pro time was a little bit higher today than it was yesterday. Her bilirubin is slightly higher today than yesterday as well. There is no abdominal pain or other symptoms. She does have a elevated IgG as well as elevated anti-smooth muscle antibody please see original GI consult. There is also evidence of a possible IPMT in the pancreas and there is a mild elevation of CA-19-9 without evidence of tumor otherwise. There was no evidence of obstruction or dilating bile ducts.  Plan:  1. Awaiting results of liver biopsy done earlier today. Hopefully this will be back tomorrow. If consistent with autoimmune hepatitis will need to start a steroid dose. Other recommendations to follow. I appreciate internal medicine assistance with admission. Will recheck LFTs pro-time and CBC tomorrow morning.  Christena Deem MD 02/21/2017, 4:06 PM

## 2017-02-21 NOTE — Progress Notes (Signed)
Nutrition Brief Note  Patient identified on the Malnutrition Screening Tool (MST) Report  Wt Readings from Last 15 Encounters:  02/21/17 154 lb 15.7 oz (70.3 kg)    Body mass index is 27.45 kg/m. Patient meets criteria for overweight based on current BMI.   Current diet order is 2 gram Na, patient is consuming approximately 100% of meals at this time. Labs and medications reviewed.   No nutrition interventions warranted at this time. If nutrition issues arise, please consult RD.   Dionne AnoWilliam M. Kinser Fellman, MS, RD LDN Inpatient Clinical Dietitian Pager [phone]

## 2017-02-21 NOTE — Progress Notes (Signed)
[facility] - [place] [facility]   PATIENT NAME: [name] [name]    MR#:  [id number]  DATE OF BIRTH:  [birth year]  SUBJECTIVE:   No acute issues Overnight. Patient reports no abdominal pain.  REVIEW OF SYSTEMS:    Review of Systems  Constitutional: Negative for fever, chills weight loss HENT: Negative for ear pain, nosebleeds, congestion, facial swelling, rhinorrhea, neck pain, neck stiffness and ear discharge.   Respiratory: Negative for cough, shortness of breath, wheezing  Cardiovascular: Negative for chest pain, palpitations and leg swelling.  Gastrointestinal: Negative for heartburn, abdominal pain, vomiting, diarrhea or consitpation Genitourinary: Negative for dysuria, urgency, frequency, hematuria Musculoskeletal: Negative for back pain or joint pain Neurological: Negative for dizziness, seizures, syncope, focal weakness,  numbness and headaches.  Hematological: Does not bruise/bleed easily.  Psychiatric/Behavioral: Negative for hallucinations, confusion, dysphoric mood    Tolerating Diet: yes      DRUG ALLERGIES:   Allergies  Allergen Reactions  . Sulfa Antibiotics Nausea And Vomiting    Pt has not had a sulfa drug in years and is not certain of reaction but suspects nausea and vomiting.    VITALS:  Blood pressure (!) 144/86, pulse 74, temperature 98.3 F (36.8 C), temperature source Oral, resp. rate 20, height 5\' 3"  (1.6 m), weight 70.3 kg (154 lb 15.7 oz), SpO2 92 %.  PHYSICAL EXAMINATION:  Constitutional: Appears well-developed and well-nourished. No distress. HENT: Normocephalic. Marland Kitchen. Oropharynx is clear and moist.  Eyes: Conjunctivae and EOM are normal. PERRLA, no scleral icterus.  Neck: Normal ROM. Neck supple. No JVD. No tracheal deviation. CVS: RRR, S1/S2 +, no murmurs, no gallops, no carotid bruit.  Pulmonary: Effort and breath sounds normal, no stridor, rhonchi, wheezes, rales.  Abdominal: Soft. BS +,  no distension, tenderness,  rebound or guarding.  Musculoskeletal: Normal range of motion. No edema and no tenderness.  Neuro: Alert. CN 2-12 grossly intact. No focal deficits. Skin: Skin is warm and dry. No rash noted. Psychiatric: Normal mood and affect.      LABORATORY PANEL:   CBC  Recent Labs Lab 02/21/17 0510  WBC 4.9  HGB 12.0  HCT 35.2  PLT 105*   ------------------------------------------------------------------------------------------------------------------  Chemistries   Recent Labs Lab 02/20/17 1939 02/21/17 0510  NA 141 138  K 3.5 3.3*  CL 108 110  CO2 25 24  GLUCOSE 119* 107*  BUN 22* 21*  CREATININE 1.13* 1.01*  CALCIUM 9.5 8.8*  AST 862*  --   ALT 365*  --   ALKPHOS 88  --   BILITOT 7.9*  --    ------------------------------------------------------------------------------------------------------------------  Cardiac Enzymes No results for input(s): TROPONINI in the last 168 hours. ------------------------------------------------------------------------------------------------------------------  RADIOLOGY:  No results found.   ASSESSMENT AND PLAN:   71 year old female who was admitted from outpatient clinic due to increasing enzyme elevations with plan for liver biopsy.  1. Increasing LFTs/bilirubin: Patient cannot have outpatient liver biopsy until 10-14 days and therefore required admission. No etiology for elevated LFTs. Patient has had outpatient workup including elevated ASMA and AMA as well as IgG S per GI note. Concern is for autoimmune hepatitis Plan for liver biopsy today  2. Thrombocytopenia: Continue to monitor  3. Pancreatic cyst seen on previous MRCP This is most consistent with indolent cystic neoplasm like IPMN or pseudocyst. CA 19-9 also elevated at 220. Will continue to evaluate as above, and monitor pancreatic cyst in 6 months per ACR consensus guidelines: Management of Incidental Pancreatic Cysts: A White Paper of the ACR  Incidental Findings  Committee. J Am Coll Radiol 2017;14:911-923.  4. UTI: Recently treated on ampicillin for Escherichia coli. Start Rocephin and follow up urine culture 5. Hypokalemia: Replete   Management plans discussed with the patient and she is in agreement.  CODE STATUS: full  TOTAL TIME TAKING CARE OF THIS PATIENT: 30 minutes.     POSSIBLE D/C 1-2 days, DEPENDING ON CLINICAL CONDITION.   Lacharles Altschuler M.D on 02/21/2017 at 8:16 AM  Between 7am to 6pm - Pager - [phone] After 6pm go to [email or web address] - password [email or web address]  [facility]  Office  [phone]  CC: Primary care physician; Margaretann Loveless, MD  Note: This dictation was prepared with Dragon dictation along with smaller phrase technology. Any transcriptional errors that result from this process are unintentional.

## 2017-02-21 NOTE — Progress Notes (Signed)
Pharmacy Antibiotic Note  [name] [name] is a 71 y.o. female admitted on 02/20/2017 with Abnormal LFTs and hematuria. Patient recently treated outpatient wil Ampicillin x 5 days starting on 7/30 for UTI. Outpatient urine culture from 7/30 grew pansensitive E.Coli.     Patient still reporting hematuria. Pharmacy has been consulted for ceftriaxone dosing.    Plan: Will start patient on ceftriaxone 1g IV every 24 hours.  Recommend sending urine culture.   Height: 5\' 3"  (160 cm) Weight: 154 lb 15.7 oz (70.3 kg) IBW/kg (Calculated) : 52.4  Temp (24hrs), Avg:98.2 F (36.8 C), Min:97.8 F (36.6 C), Max:98.6 F (37 C)   Recent Labs Lab 02/20/17 1939 02/21/17 0510  WBC  --  4.9  CREATININE 1.13* 1.01*    Estimated Creatinine Clearance: 48.1 mL/min (A) (by C-G formula based on SCr of 1.01 mg/dL (H)).    Allergies  Allergen Reactions  . Sulfa Antibiotics     Antimicrobials this admission: 7/30 Ampicillin >> 8/5 8/7 ceftriaxone >>   Dose adjustments this admission:   Microbiology results: 7/30 Ucx: Pansensitive E.Coli   Thank you for allowing pharmacy to be a part of this patient's care.  Gardner CandleSheema M Carlon Davidson, PharmD, BCPS Clinical Pharmacist 02/21/2017 7:36 AM

## 2017-02-22 LAB — BILIRUBIN, DIRECT: BILIRUBIN DIRECT: 3.8 mg/dL — AB (ref 0.1–0.5)

## 2017-02-22 LAB — URINE CULTURE: CULTURE: NO GROWTH

## 2017-02-22 LAB — CBC WITH DIFFERENTIAL/PLATELET
BASOS PCT: 1 %
Basophils Absolute: 0 10*3/uL (ref 0–0.1)
EOS ABS: 0.1 10*3/uL (ref 0–0.7)
EOS PCT: 1 %
HCT: 38.5 % (ref 35.0–47.0)
HEMOGLOBIN: 13.1 g/dL (ref 12.0–16.0)
Lymphocytes Relative: 11 %
Lymphs Abs: 0.7 10*3/uL — ABNORMAL LOW (ref 1.0–3.6)
MCH: 29.3 pg (ref 26.0–34.0)
MCHC: 34.1 g/dL (ref 32.0–36.0)
MCV: 85.8 fL (ref 80.0–100.0)
MONO ABS: 0.7 10*3/uL (ref 0.2–0.9)
MONOS PCT: 10 %
NEUTROS PCT: 77 %
Neutro Abs: 5.3 10*3/uL (ref 1.4–6.5)
PLATELETS: 100 10*3/uL — AB (ref 150–440)
RBC: 4.49 MIL/uL (ref 3.80–5.20)
RDW: 22.3 % — AB (ref 11.5–14.5)
WBC: 6.7 10*3/uL (ref 3.6–11.0)

## 2017-02-22 LAB — COMPREHENSIVE METABOLIC PANEL
ALT: 343 U/L — ABNORMAL HIGH (ref 14–54)
ANION GAP: 7 (ref 5–15)
AST: 729 U/L — ABNORMAL HIGH (ref 15–41)
Albumin: 2.6 g/dL — ABNORMAL LOW (ref 3.5–5.0)
Alkaline Phosphatase: 77 U/L (ref 38–126)
BUN: 21 mg/dL — AB (ref 6–20)
CALCIUM: 9 mg/dL (ref 8.9–10.3)
CHLORIDE: 108 mmol/L (ref 101–111)
CO2: 24 mmol/L (ref 22–32)
CREATININE: 0.88 mg/dL (ref 0.44–1.00)
Glucose, Bld: 120 mg/dL — ABNORMAL HIGH (ref 65–99)
POTASSIUM: 4 mmol/L (ref 3.5–5.1)
Sodium: 139 mmol/L (ref 135–145)
Total Bilirubin: 7.9 mg/dL — ABNORMAL HIGH (ref 0.3–1.2)
Total Protein: 7.2 g/dL (ref 6.5–8.1)

## 2017-02-22 LAB — PROTIME-INR
INR: 1.4
Prothrombin Time: 17.3 seconds — ABNORMAL HIGH (ref 11.4–15.2)

## 2017-02-22 LAB — SURGICAL PATHOLOGY

## 2017-02-22 LAB — GLUCOSE, CAPILLARY: GLUCOSE-CAPILLARY: 105 mg/dL — AB (ref 65–99)

## 2017-02-22 NOTE — Consult Note (Signed)
Subjective: Patient seen for abnormal liver enzymes. Patient admitted to the hospital for inpatient liver biopsy. She tolerated this well yesterday. She is doing well overall. She had some nausea with some emesis last night however that has not recurred today. She has no abdominal pain. Has had a bowel movement since yesterday. This was normal for her. She is tolerating by mouth.  Objective: Vital signs in last 24 hours: Temp:  [97.5 F (36.4 C)-98.8 F (37.1 C)] 97.5 F (36.4 C) (08/08 1251) Pulse Rate:  [74-83] 76 (08/08 1251) Resp:  [20] 20 (08/08 1251) BP: (138-161)/(68-96) 138/68 (08/08 1251) SpO2:  [94 %-95 %] 95 % (08/08 1251) Weight:  [67.1 kg (147 lb 14.9 oz)] 67.1 kg (147 lb 14.9 oz) (08/08 0502) Blood pressure 138/68, pulse 76, temperature (!) 97.5 F (36.4 C), temperature source Oral, resp. rate 20, height 5\' 3"  (1.6 m), weight 67.1 kg (147 lb 14.9 oz), SpO2 95 %.   Intake/Output from previous day: 08/07 0701 - 08/08 0700 In: 50 [IV Piggyback:50] Out: 750 [Urine:750]  Intake/Output this shift: Total I/O In: 240 [P.O.:240] Out: 350 [Urine:350]   General appearance:  71 year old female no acute distress mild jaundice Resp:  Bilaterally clear to auscultation Cardio:  Regular rate and rhythm without rub or gallop GI:  Soft nontender nondistended bowel sounds positive normoactive Extremities:  No clubbing cyanosis or edema   Lab Results: Results for orders placed or performed during the hospital encounter of 02/20/17 (from the past 24 hour(s))  Comprehensive metabolic panel     Status: Abnormal   Collection Time: 02/22/17  4:24 AM  Result Value Ref Range   Sodium 139 135 - 145 mmol/L   Potassium 4.0 3.5 - 5.1 mmol/L   Chloride 108 101 - 111 mmol/L   CO2 24 22 - 32 mmol/L   Glucose, Bld 120 (H) 65 - 99 mg/dL   BUN 21 (H) 6 - 20 mg/dL   Creatinine, Ser 1.61 0.44 - 1.00 mg/dL   Calcium 9.0 8.9 - 09.6 mg/dL   Total Protein 7.2 6.5 - 8.1 g/dL   Albumin 2.6 (L) 3.5 -  5.0 g/dL   AST 045 (H) 15 - 41 U/L   ALT 343 (H) 14 - 54 U/L   Alkaline Phosphatase 77 38 - 126 U/L   Total Bilirubin 7.9 (H) 0.3 - 1.2 mg/dL   GFR calc non Af Amer >60 >60 mL/min   GFR calc Af Amer >60 >60 mL/min   Anion gap 7 5 - 15  CBC with Differential/Platelet     Status: Abnormal   Collection Time: 02/22/17  4:24 AM  Result Value Ref Range   WBC 6.7 3.6 - 11.0 K/uL   RBC 4.49 3.80 - 5.20 MIL/uL   Hemoglobin 13.1 12.0 - 16.0 g/dL   HCT 40.9 81.1 - 91.4 %   MCV 85.8 80.0 - 100.0 fL   MCH 29.3 26.0 - 34.0 pg   MCHC 34.1 32.0 - 36.0 g/dL   RDW 78.2 (H) 95.6 - 21.3 %   Platelets 100 (L) 150 - 440 K/uL   Neutrophils Relative % 77 %   Neutro Abs 5.3 1.4 - 6.5 K/uL   Lymphocytes Relative 11 %   Lymphs Abs 0.7 (L) 1.0 - 3.6 K/uL   Monocytes Relative 10 %   Monocytes Absolute 0.7 0.2 - 0.9 K/uL   Eosinophils Relative 1 %   Eosinophils Absolute 0.1 0 - 0.7 K/uL   Basophils Relative 1 %   Basophils Absolute 0.0  0 - 0.1 K/uL  Bilirubin, direct     Status: Abnormal   Collection Time: 02/22/17  4:24 AM  Result Value Ref Range   Bilirubin, Direct 3.8 (H) 0.1 - 0.5 mg/dL  Glucose, capillary     Status: Abnormal   Collection Time: 02/22/17  8:15 AM  Result Value Ref Range   Glucose-Capillary 105 (H) 65 - 99 mg/dL  Protime-INR     Status: Abnormal   Collection Time: 02/22/17  9:28 AM  Result Value Ref Range   Prothrombin Time 17.3 (H) 11.4 - 15.2 seconds   INR 1.40       Recent Labs  02/21/17 0510 02/22/17 0424  WBC 4.9 6.7  HGB 12.0 13.1  HCT 35.2 38.5  PLT 105* 100*   BMET  Recent Labs  02/20/17 1939 02/21/17 0510 02/22/17 0424  NA 141 138 139  K 3.5 3.3* 4.0  CL 108 110 108  CO2 25 24 24   GLUCOSE 119* 107* 120*  BUN 22* 21* 21*  CREATININE 1.13* 1.01* 0.88  CALCIUM 9.5 8.8* 9.0   LFT  Recent Labs  02/22/17 0424  PROT 7.2  ALBUMIN 2.6*  AST 729*  ALT 343*  ALKPHOS 77  BILITOT 7.9*  BILIDIR 3.8*   PT/INR  Recent Labs  02/21/17 0916  02/22/17 0928  LABPROT 18.1* 17.3*  INR 1.48 1.40   Hepatitis Panel No results for input(s): HEPBSAG, HCVAB, HEPAIGM, HEPBIGM in the last 72 hours. C-Diff No results for input(s): CDIFFTOX in the last 72 hours. No results for input(s): CDIFFPCR in the last 72 hours.   Studies/Results: US Biopsy  Result Date: 02/21/2017 INDICATION: Elevated LFTs EXAM: ULTRASOUND GUIDED LIVER BIOPSY MEDICATIONS: None. ANESTHESIA/SEDATION: None FLUOROSCOPY TIME:  Not applicable COMPLICATIONS: None immediate. PROCEDURE: Informed written consent was obtained from the patient after a thorough discussion of the procedural risks, benefits and alternatives. All questions were addressed. Maximal Sterile Barrier Technique was utilized including caps, mask, sterile gowns, sterile gloves, sterile drape, hand hygiene and skin antiseptic. A timeout was performed prior to the initiation of the procedure. Utilizing 1% xylocaine as local anesthetic and real-time ultrasound guidance a 17 gauge guiding needle was placed percutaneously into the left lobe of the liver. Hard copy imaging was obtained to document needle placement. Subsequently multiple 18 gauge core biopsies were obtained without difficulty under real-time ultrasound guidance. Imaging of the needle passes was obtained as well. The guiding needle was then removed and Gel-Foam slurry placed to aid in hemostasis. The patient tolerated the procedure well was returned her room in satisfactory condition. IMPRESSION: Successful ultrasound guided liver biopsy as described. Electronically Signed   By: Alcide Clever M.D.   On: 02/21/2017 13:27    Scheduled Inpatient Medications:   . benazepril  40 mg Oral Daily  . docusate sodium  100 mg Oral BID  . metoprolol tartrate  25 mg Oral BID    Continuous Inpatient Infusions:    PRN Inpatient Medications:  acetaminophen **OR** acetaminophen, HYDROcodone-acetaminophen, ondansetron **OR** ondansetron (ZOFRAN) IV,  traZODone  Miscellaneous:   Assessment:  1. Abnormal liver associated enzymes stable at present. Liver biopsy consistent with serologic evidence of autoimmune hepatitis.  Plan:  1. Will begin patient on steroids by mouth as an outpatient. She may be discharged this afternoon. We will call in the prescriptions to her pharmacy and will arrange for outpatient follow-up and labs. She is to be seen in the outpatient clinic this week on Friday. And then again next week for further consideration.  I appreciate internal medicine assistance on this admission.  Christena DeemMartin U Malajah Oceguera MD 02/22/2017, 2:31 PM

## 2017-02-22 NOTE — Discharge Summary (Signed)
[employer] [facility] - [place] at [facility]   PATIENT NAME: Jeanette Villasenor    MR#:  [id number]  DATE OF BIRTH:  1945/10/02  DATE OF ADMISSION:  02/20/2017 ADMITTING PHYSICIAN: Ramonita Lab, MD  DATE OF DISCHARGE: 02/22/2017  4:10 PM  PRIMARY CARE PHYSICIAN: Margaretann Loveless, MD    ADMISSION DIAGNOSIS:  abnormal liver tests  DISCHARGE DIAGNOSIS:  Active Problems:   Abnormal LFTs   SECONDARY DIAGNOSIS:   Past Medical History:  Diagnosis Date  . Hypertension     HOSPITAL COURSE:  71 year old female who was admitted from outpatient clinic due to increasing enzyme elevations with plan for liver biopsy.  1. Increasing LFTs/bilirubin: Patient underwent liver biopsy. Results of the Liver biopsy are consistent with serologic evidence of autoimmune hepatitis. She will begin PO steroids as an outpatient once she follows up with Dr Preston Fleeting as per his recommendations.  2. Thrombocytopenia: This was stable.  3. Pancreatic cyst seen on previous MRCP This is most consistent with indolent cystic neoplasm like IPMN or pseudocyst. CA 19-9 also elevated at 220. Will continue to evaluate as above, and monitor pancreatic cyst in 6 months per ACR consensus guidelines: Management of Incidental Pancreatic Cysts: A White Paper of the ACR Incidental Findings Committee. J Am Coll Radiol 2017;14:911-923.  4. UTI: Recently treated on ampicillin for Escherichia coli. Her urine culture was negative so antibiotics were discontinued.  5. Hypokalemia: Repleted    DISCHARGE CONDITIONS AND DIET:  stable Regular diet  CONSULTS OBTAINED:  Treatment Team:  Wyline Mood, MD  DRUG ALLERGIES:   Allergies  Allergen Reactions  . Sulfa Antibiotics Nausea And Vomiting    Pt has not had a sulfa drug in years and is not certain of reaction but suspects nausea and vomiting.    DISCHARGE MEDICATIONS:   Discharge Medication List as of 02/22/2017  6:27 PM    CONTINUE these medications which have  NOT CHANGED   Details  ampicillin (PRINCIPEN) 500 MG capsule Take 500 mg by mouth 4 (four) times daily., Historical Med    aspirin EC 81 MG tablet Take by mouth., Historical Med    benazepril (LOTENSIN) 40 MG tablet Take by mouth., Historical Med    metoprolol tartrate (LOPRESSOR) 50 MG tablet Take 50 mg by mouth daily., Historical Med          Today   CHIEF COMPLAINT:  Has some nausea yesterday because she ate late fine this am   VITAL SIGNS:  Blood pressure 138/68, pulse 76, temperature (!) 97.5 F (36.4 C), temperature source Oral, resp. rate 20, height 5\' 3"  (1.6 m), weight 67.1 kg (147 lb 14.9 oz), SpO2 95 %.   REVIEW OF SYSTEMS:  Review of Systems  Constitutional: Negative.  Negative for chills, fever and malaise/fatigue.  HENT: Negative.  Negative for ear discharge, ear pain, hearing loss, nosebleeds and sore throat.   Eyes: Negative.  Negative for blurred vision and pain.  Respiratory: Negative.  Negative for cough, hemoptysis, shortness of breath and wheezing.   Cardiovascular: Negative.  Negative for chest pain, palpitations and leg swelling.  Gastrointestinal: Negative.  Negative for abdominal pain, blood in stool, diarrhea, nausea and vomiting.  Genitourinary: Negative.  Negative for dysuria.  Musculoskeletal: Negative.  Negative for back pain.  Skin: Negative.   Neurological: Negative for dizziness, tremors, speech change, focal weakness, seizures and headaches.  Endo/Heme/Allergies: Negative.  Does not bruise/bleed easily.  Psychiatric/Behavioral: Negative.  Negative for depression, hallucinations and suicidal ideas.     PHYSICAL EXAMINATION:  GENERAL:  71 y.o.-year-old patient lying in the bed with no acute distress.  NECK:  Supple, no jugular venous distention. No thyroid enlargement, no tenderness.  LUNGS: Normal breath sounds bilaterally, no wheezing, rales,rhonchi  No use of accessory muscles of respiration.  CARDIOVASCULAR: S1, S2 normal. No  murmurs, rubs, or gallops.  ABDOMEN: Soft, non-tender, non-distended. Bowel sounds present. No organomegaly or mass.  EXTREMITIES: No pedal edema, cyanosis, or clubbing.  PSYCHIATRIC: The patient is alert and oriented x 3.  SKIN: No obvious rash, lesion, or ulcer.   DATA REVIEW:   CBC  Recent Labs Lab 02/22/17 0424  WBC 6.7  HGB 13.1  HCT 38.5  PLT 100*    Chemistries   Recent Labs Lab 02/22/17 0424  NA 139  K 4.0  CL 108  CO2 24  GLUCOSE 120*  BUN 21*  CREATININE 0.88  CALCIUM 9.0  AST 729*  ALT 343*  ALKPHOS 77  BILITOT 7.9*    Cardiac Enzymes No results for input(s): TROPONINI in the last 168 hours.  Microbiology Results  @MICRORSLT48 @  RADIOLOGY:  US Biopsy  Result Date: 02/21/2017 INDICATION: Elevated LFTs EXAM: ULTRASOUND GUIDED LIVER BIOPSY MEDICATIONS: None. ANESTHESIA/SEDATION: None FLUOROSCOPY TIME:  Not applicable COMPLICATIONS: None immediate. PROCEDURE: Informed written consent was obtained from the patient after a thorough discussion of the procedural risks, benefits and alternatives. All questions were addressed. Maximal Sterile Barrier Technique was utilized including caps, mask, sterile gowns, sterile gloves, sterile drape, hand hygiene and skin antiseptic. A timeout was performed prior to the initiation of the procedure. Utilizing 1% xylocaine as local anesthetic and real-time ultrasound guidance a 17 gauge guiding needle was placed percutaneously into the left lobe of the liver. Hard copy imaging was obtained to document needle placement. Subsequently multiple 18 gauge core biopsies were obtained without difficulty under real-time ultrasound guidance. Imaging of the needle passes was obtained as well. The guiding needle was then removed and Gel-Foam slurry placed to aid in hemostasis. The patient tolerated the procedure well was returned her room in satisfactory condition. IMPRESSION: Successful ultrasound guided liver biopsy as described.  Electronically Signed   By: Alcide Clever M.D.   On: 02/21/2017 13:27      Discharge Medication List as of 02/22/2017  6:27 PM    CONTINUE these medications which have NOT CHANGED   Details  ampicillin (PRINCIPEN) 500 MG capsule Take 500 mg by mouth 4 (four) times daily., Historical Med    aspirin EC 81 MG tablet Take by mouth., Historical Med    benazepril (LOTENSIN) 40 MG tablet Take by mouth., Historical Med    metoprolol tartrate (LOPRESSOR) 50 MG tablet Take 50 mg by mouth daily., Historical Med           Management plans discussed with the patient and she is in agreement. Stable for discharge home  Patient should follow up with  Dr Marva Panda  CODE STATUS:     Code Status Orders        Start     Ordered   02/20/17 1830  Full code  Continuous     02/20/17 1831    Code Status History    Date Active Date Inactive Code Status Order ID Comments User Context   This patient has a current code status but no historical code status.    Advance Directive Documentation     Most Recent Value  Type of Advance Directive  Living will  Pre-existing out of facility DNR order (yellow form or pink  MOST form)  -  "MOST" Form in Place?  -      TOTAL TIME TAKING CARE OF THIS PATIENT: 37 minutes.    Note: This dictation was prepared with Dragon dictation along with smaller phrase technology. Any transcriptional errors that result from this process are unintentional.  Shawntae Lowy M.D on 02/22/2017 at 7:59 PM  Between 7am to 6pm - Pager - [phone] After 6pm go to [email or web address] - password [email or web address] [email or web address]  [facility] [place] [facility]  Office  [phone]  CC: Primary care physician; Margaretann LovelessKhan, Neelam S, MD

## 2017-02-22 NOTE — Progress Notes (Signed)
Received verbal order from Dr. Juliene PinaMody to discharge pt. Pt to follow-up with Dr. Marva PandaSkulskie on Friday. IV removed per order. Pt discharged using downtown forms. Signed copy placed in chart. Questions answered to pt satisfaction.

## 2017-02-22 NOTE — Progress Notes (Signed)
[employer] - [place] at [facility]   PATIENT NAME: [name] [name]    MR#:  [id number]  DATE OF BIRTH:  [birth year]  SUBJECTIVE:   No acute issues  REVIEW OF SYSTEMS:    Review of Systems  Constitutional: Negative for fever, chills weight loss HENT: Negative for ear pain, nosebleeds, congestion, facial swelling, rhinorrhea, neck pain, neck stiffness and ear discharge.   Respiratory: Negative for cough, shortness of breath, wheezing  Cardiovascular: Negative for chest pain, palpitations and leg swelling.  Gastrointestinal: Negative for heartburn, abdominal pain, vomiting, diarrhea or consitpation Genitourinary: Negative for dysuria, urgency, frequency, hematuria Musculoskeletal: Negative for back pain or joint pain Neurological: Negative for dizziness, seizures, syncope, focal weakness,  numbness and headaches.  Hematological: Does not bruise/bleed easily.  Psychiatric/Behavioral: Negative for hallucinations, confusion, dysphoric mood    Tolerating Diet: yes      DRUG ALLERGIES:   Allergies  Allergen Reactions  . Sulfa Antibiotics Nausea And Vomiting    Pt has not had a sulfa drug in years and is not certain of reaction but suspects nausea and vomiting.    VITALS:  Blood pressure (!) 161/93, pulse 74, temperature 98.6 F (37 C), temperature source Oral, resp. rate 20, height 5\' 3"  (1.6 m), weight 67.1 kg (147 lb 14.9 oz), SpO2 94 %.  PHYSICAL EXAMINATION:  Constitutional: Appears well-developed and well-nourished. No distress. HENT: Normocephalic. Marland Kitchen Oropharynx is clear and moist.  Eyes: Conjunctivae and EOM are normal. PERRLA, no scleral icterus.  Neck: Normal ROM. Neck supple. No JVD. No tracheal deviation. CVS: RRR, S1/S2 +, no murmurs, no gallops, no carotid bruit.  Pulmonary: Effort and breath sounds normal, no stridor, rhonchi, wheezes, rales.  Abdominal: Soft. BS +,  no distension, tenderness, rebound or guarding.  Musculoskeletal: Normal range  of motion. No edema and no tenderness.  Neuro: Alert. CN 2-12 grossly intact. No focal deficits. Skin: Skin is warm and dry. No rash noted. Psychiatric: Normal mood and affect.      LABORATORY PANEL:   CBC  Recent Labs Lab 02/22/17 0424  WBC 6.7  HGB 13.1  HCT 38.5  PLT 100*   ------------------------------------------------------------------------------------------------------------------  Chemistries   Recent Labs Lab 02/22/17 0424  NA 139  K 4.0  CL 108  CO2 24  GLUCOSE 120*  BUN 21*  CREATININE 0.88  CALCIUM 9.0  AST 729*  ALT 343*  ALKPHOS 77  BILITOT 7.9*   ------------------------------------------------------------------------------------------------------------------  Cardiac Enzymes No results for input(s): TROPONINI in the last 168 hours. ------------------------------------------------------------------------------------------------------------------  RADIOLOGY:  US Biopsy  Result Date: 02/21/2017 INDICATION: Elevated LFTs EXAM: ULTRASOUND GUIDED LIVER BIOPSY MEDICATIONS: None. ANESTHESIA/SEDATION: None FLUOROSCOPY TIME:  Not applicable COMPLICATIONS: None immediate. PROCEDURE: Informed written consent was obtained from the patient after a thorough discussion of the procedural risks, benefits and alternatives. All questions were addressed. Maximal Sterile Barrier Technique was utilized including caps, mask, sterile gowns, sterile gloves, sterile drape, hand hygiene and skin antiseptic. A timeout was performed prior to the initiation of the procedure. Utilizing 1% xylocaine as local anesthetic and real-time ultrasound guidance a 17 gauge guiding needle was placed percutaneously into the left lobe of the liver. Hard copy imaging was obtained to document needle placement. Subsequently multiple 18 gauge core biopsies were obtained without difficulty under real-time ultrasound guidance. Imaging of the needle passes was obtained as well. The guiding needle was  then removed and Gel-Foam slurry placed to aid in hemostasis. The patient tolerated the procedure well was returned her room in satisfactory condition.  IMPRESSION: Successful ultrasound guided liver biopsy as described. Electronically Signed   By: Alcide CleverMark  Lukens M.D.   On: 02/21/2017 13:27     ASSESSMENT AND PLAN:   71 year old female who was admitted from outpatient clinic due to increasing enzyme elevations with plan for liver biopsy.  1. Increasing LFTs/bilirubin: Patient underwent liver biopsy yesterday. Results are pending. No clear etiology for elevated LFTs. Patient has had outpatient workup including elevated ASMA and AMA as well as IgG S per GI note. Follow-up pathology results as this will dictate discharge planning. LFTs today very mild improvement Order PT/INR  2. Thrombocytopenia: Continue to monitor  3. Pancreatic cyst seen on previous MRCP This is most consistent with indolent cystic neoplasm like IPMN or pseudocyst. CA 19-9 also elevated at 220. Will continue to evaluate as above, and monitor pancreatic cyst in 6 months per ACR consensus guidelines: Management of Incidental Pancreatic Cysts: A White Paper of the ACR Incidental Findings Committee. J Am Coll Radiol 2017;14:911-923.  4. UTI: Recently treated on ampicillin for Escherichia coli. Continue Rocephin and follow up urine culture 5. Hypokalemia: Repleted   Management plans discussed with the patient and she is in agreement.  CODE STATUS: full  TOTAL TIME TAKING CARE OF THIS PATIENT: 21 minutes.   D/w dr Marva Pandaskulskie  POSSIBLE D/C ?? DEPENDING ON results of bx. Dorris CarnesN.   Hamid Brookens M.D on 71/02/2017 at 9:00 AM  Between 7am to 6pm - Pager - [phone] After 6pm go to [email or web address] - password [email or web address]  [facility] [place] [facility]  Office  [phone]  CC: Primary care physician; Margaretann LovelessKhan, Neelam S, MD  Note: This dictation was prepared with Dragon dictation along with smaller phrase technology. Any  transcriptional errors that result from this process are unintentional.

## 2017-03-01 ENCOUNTER — Ambulatory Visit: Payer: Medicare Other

## 2017-03-02 ENCOUNTER — Ambulatory Visit: Payer: Medicare Other

## 2017-03-23 ENCOUNTER — Other Ambulatory Visit
Admission: RE | Admit: 2017-03-23 | Discharge: 2017-03-23 | Disposition: A | Discharge status: Home / Self Care | Payer: Medicare Other | Source: Ambulatory Visit | Attending: Nurse Practitioner | Admitting: Nurse Practitioner

## 2017-03-23 DIAGNOSIS — R7989 Other specified abnormal findings of blood chemistry: Secondary | ICD-10-CM | POA: Diagnosis present

## 2017-04-05 LAB — MISCELLANEOUS TEST

## 2017-10-05 ENCOUNTER — Encounter: Payer: Self-pay | Admitting: Emergency Medicine

## 2017-10-05 ENCOUNTER — Emergency Department: Payer: Medicare Other

## 2017-10-05 ENCOUNTER — Emergency Department
Admission: EM | Admit: 2017-10-05 | Discharge: 2017-10-05 | Disposition: A | Discharge status: Home / Self Care | Payer: Medicare Other | Attending: Emergency Medicine | Admitting: Emergency Medicine

## 2017-10-05 DIAGNOSIS — Y998 Other external cause status: Secondary | ICD-10-CM | POA: Diagnosis not present

## 2017-10-05 DIAGNOSIS — Z7982 Long term (current) use of aspirin: Secondary | ICD-10-CM | POA: Insufficient documentation

## 2017-10-05 DIAGNOSIS — W01190A Fall on same level from slipping, tripping and stumbling with subsequent striking against furniture, initial encounter: Secondary | ICD-10-CM | POA: Insufficient documentation

## 2017-10-05 DIAGNOSIS — S7001XA Contusion of right hip, initial encounter: Secondary | ICD-10-CM | POA: Diagnosis not present

## 2017-10-05 DIAGNOSIS — S20211A Contusion of right front wall of thorax, initial encounter: Secondary | ICD-10-CM | POA: Diagnosis not present

## 2017-10-05 DIAGNOSIS — Z79899 Other long term (current) drug therapy: Secondary | ICD-10-CM | POA: Diagnosis not present

## 2017-10-05 DIAGNOSIS — I10 Essential (primary) hypertension: Secondary | ICD-10-CM | POA: Diagnosis not present

## 2017-10-05 DIAGNOSIS — Y9389 Activity, other specified: Secondary | ICD-10-CM | POA: Insufficient documentation

## 2017-10-05 DIAGNOSIS — R42 Dizziness and giddiness: Secondary | ICD-10-CM | POA: Insufficient documentation

## 2017-10-05 DIAGNOSIS — S299XXA Unspecified injury of thorax, initial encounter: Secondary | ICD-10-CM | POA: Diagnosis present

## 2017-10-05 DIAGNOSIS — Y92009 Unspecified place in unspecified non-institutional (private) residence as the place of occurrence of the external cause: Secondary | ICD-10-CM | POA: Diagnosis not present

## 2017-10-05 HISTORY — DX: Inflammatory liver disease, unspecified: K75.9

## 2017-10-05 LAB — CBC
HEMATOCRIT: 39.8 % (ref 35.0–47.0)
Hemoglobin: 13.2 g/dL (ref 12.0–16.0)
MCH: 27.3 pg (ref 26.0–34.0)
MCHC: 33.3 g/dL (ref 32.0–36.0)
MCV: 82.1 fL (ref 80.0–100.0)
Platelets: 177 10*3/uL (ref 150–440)
RBC: 4.85 MIL/uL (ref 3.80–5.20)
RDW: 19.1 % — ABNORMAL HIGH (ref 11.5–14.5)
WBC: 7.8 10*3/uL (ref 3.6–11.0)

## 2017-10-05 LAB — BASIC METABOLIC PANEL
Anion gap: 11 (ref 5–15)
BUN: 56 mg/dL — AB (ref 6–20)
CALCIUM: 9.3 mg/dL (ref 8.9–10.3)
CO2: 19 mmol/L — ABNORMAL LOW (ref 22–32)
Chloride: 112 mmol/L — ABNORMAL HIGH (ref 101–111)
Creatinine, Ser: 1.49 mg/dL — ABNORMAL HIGH (ref 0.44–1.00)
GFR calc Af Amer: 40 mL/min — ABNORMAL LOW (ref >60)
GFR, EST NON AFRICAN AMERICAN: 34 mL/min — AB (ref >60)
GLUCOSE: 110 mg/dL — AB (ref 65–99)
Potassium: 4 mmol/L (ref 3.5–5.1)
Sodium: 142 mmol/L (ref 135–145)

## 2017-10-05 MED ORDER — TRAMADOL HCL 50 MG PO TABS: 50.0000 mg | ORAL_TABLET | Freq: Four times a day (QID) | ORAL | 0 refills | Status: AC | PRN

## 2017-10-05 MED ORDER — WALKER MISC: 1.0000 | 0 refills | Status: DC

## 2017-10-05 NOTE — Discharge Instructions (Signed)
Return to the ER for new, worsening, persistent severe pain, weakness or numbness, inability to walk or bear weight, worsening shortness of breath, or any other new or worsening symptoms that concern you.

## 2017-10-05 NOTE — ED Notes (Signed)
Pt presents today post fall from Tuesday. Pt has pain in the lower back that has been there since fall. Pt states EMS came to house and helped off the floor and took BP. BP was normal at that time. Pt is A/O with family at bedside.

## 2017-10-05 NOTE — ED Notes (Signed)
Pt had a fall on Tuesday night. Pt states that she became dizzy and fell. Denies LOC. Family at bedside. Pt states that she hit her back.

## 2017-10-05 NOTE — ED Provider Notes (Addendum)
[facility] Emergency Department Provider Note ____________________________________________   None    (approximate)  I have reviewed the triage vital signs and the nursing notes.   HISTORY  Chief Complaint Dizziness and Fall    HPI [name] [name] is a 72 y.o. female with past medical history as noted below who presents with right-sided back pain, acute onset when the patient fell 2 days ago, persistent course, and associated with some pain in the right leg and difficulty ambulating.  The patient states that 2 days ago she got up during the night to use the bathroom, and states that since she has been on medication for her autoimmune hepatitis she has occasional weakness and falls.  She states she had a fall similar to prior falls with no dizziness or prodrome.  She hit her right mid back on a nightstand.  She denies LOC, severe headache, vomiting, or confusion.  She states she came today because her family members made her.  Past Medical History:  Diagnosis Date  . Hepatitis   . Hypertension     Patient Active Problem List   Diagnosis Date Noted  . Abnormal LFTs 02/20/2017    Past Surgical History:  Procedure Laterality Date  . ABDOMINAL HYSTERECTOMY      Prior to Admission medications   Medication Sig Start Date End Date Taking? Authorizing Provider  amLODipine (NORVASC) 5 MG tablet Take 5 mg by mouth daily. 10/03/17  Yes [provider]  aspirin EC 81 MG tablet Take 81 mg by mouth daily.    Yes [provider]  azaTHIOprine (IMURAN) 50 MG tablet Take 50 mg by mouth daily. 09/22/17  Yes [provider]  benazepril (LOTENSIN) 40 MG tablet Take 40 mg by mouth daily.    Yes [provider]  metoprolol tartrate (LOPRESSOR) 50 MG tablet Take 50 mg by mouth daily.   Yes [provider]  predniSONE (DELTASONE) 5 MG tablet Take 7.5 mg by mouth daily. 09/17/17  Yes [provider]  Misc. Devices  (WALKER) MISC 1 Device by Does not apply route as directed. 10/05/17   Dionne Bucy, MD  traMADol (ULTRAM) 50 MG tablet Take 1 tablet (50 mg total) by mouth every 6 (six) hours as needed for up to 5 days for moderate pain. 10/05/17 10/10/17  Dionne Bucy, MD    Allergies Sulfa antibiotics  No family history on file.  Social History Social History   Tobacco Use  . Smoking status: Never Smoker  . Smokeless tobacco: Never Used  Substance Use Topics  . Alcohol use: No  . Drug use: No    Review of Systems  Constitutional: No fever. Eyes: No visual changes. ENT: No neck pain. Cardiovascular: Denies chest pain. Respiratory: Denies shortness of breath. Gastrointestinal: No abdominal pain.  Genitourinary: Negative for flank pain.  Musculoskeletal: Positive for back pain. Skin: Positive for abrasion. Neurological: Negative for headaches, focal weakness or numbness.   ____________________________________________   PHYSICAL EXAM:  VITAL SIGNS: ED Triage Vitals [10/05/17 1244]  Enc Vitals Group     BP 118/90     Pulse Rate (!) 101     Resp 18     Temp 98.6 F (37 C)     Temp Source Oral     SpO2 94 %     Weight 155 lb (70.3 kg)     Height 5\' 4"  (1.626 m)     Head Circumference      Peak Flow  Pain Score 8     Pain Loc      Pain Edu?      Excl. in GC?     Constitutional: Alert and oriented. Well appearing and in no acute distress. Eyes: Conjunctivae are normal.  EOMI. Head: Atraumatic. Nose: No congestion/rhinnorhea. Mouth/Throat: Mucous membranes are moist.   Neck: Normal range of motion.  Cardiovascular: Good peripheral circulation. Respiratory: Normal respiratory effort.  No retractions. Lungs CTAB. Gastrointestinal: Soft and nontender. No distention.  Genitourinary: No CVA tenderness. Musculoskeletal: No lower extremity edema.  No midline spinal tenderness.  Approximately 10 cm superficial abrasion to right mid back over inferior ribs with  associated tenderness but no step-off or crepitus.  Mild pain on range of motion of right hip with no deformity or bony tenderness. Neurologic:  Normal speech and language.  Motor and sensory intact in all extremities.  Normal coordination.  No gross focal neurologic deficits are appreciated.  Skin:  Skin is warm and dry. No rash noted. Psychiatric: Mood and affect are normal. Speech and behavior are normal.  ____________________________________________   LABS (all labs ordered are listed, but only abnormal results are displayed)  Labs Reviewed  BASIC METABOLIC PANEL - Abnormal; Notable for the following components:      Result Value   Chloride 112 (*)    CO2 19 (*)    Glucose, Bld 110 (*)    BUN 56 (*)    Creatinine, Ser 1.49 (*)    GFR calc non Af Amer 34 (*)    GFR calc Af Amer 40 (*)    All other components within normal limits  CBC - Abnormal; Notable for the following components:   RDW 19.1 (*)    All other components within normal limits  URINALYSIS, COMPLETE (UACMP) WITH MICROSCOPIC   ____________________________________________  EKG  ED ECG REPORT I, Dionne BucySebastian Abbee Cremeens, the attending physician, personally viewed and interpreted this ECG.  Date: 10/05/2017 EKG Time: 1247 Rate: 88 Rhythm: Atrial fibrillation QRS Axis: Left axis deviation Intervals: normal ST/T Wave abnormalities: LVH Narrative Interpretation: no evidence of acute ischemia  ____________________________________________  RADIOLOGY  R rib series/CXR: No acute fracture R hip/pelvis: No acute fracture  ____________________________________________   PROCEDURES  Procedure(s) performed: No  Procedures  Critical Care performed: No ____________________________________________   INITIAL IMPRESSION / ASSESSMENT AND PLAN / ED COURSE  Pertinent labs & imaging results that were available during my care of the patient were reviewed by me and considered in my medical decision making (see chart for  details).  72 year old female with past medical history as noted above presents with right-sided back pain and right hip and leg pain for the last 2 days after an apparent mechanical fall from standing height.  Patient denies head injury, LOC, or any shortness of breath.  She is able to ambulate although it is painful.  Past medical records reviewed in epic and are noncontributory.  On exam, the patient has pain to the right posterior rib area as noted above and some pain on range of motion of right hip with no deformity.  Neuro/vascular intact.    Overall suspect most likely contusions, however I will obtain a right rib series and chest x-ray, as well as right hip and pelvis x-rays and reassess.     ----------------------------------------- 8:52 PM on 10/05/2017 -----------------------------------------   X-rays showed no acute fractures.  The patient feels comfortable with going home.  She is able to ambulate.  The family requested a prescription for a walker.  Return  precautions given, and she expresses understanding.  ____________________________________________   FINAL CLINICAL IMPRESSION(S) / ED DIAGNOSES  Final diagnoses:  Contusion of rib on right side, initial encounter  Contusion of right hip, initial encounter      NEW MEDICATIONS STARTED DURING THIS VISIT:  Discharge Medication List as of 10/05/2017  7:29 PM    START taking these medications   Details  traMADol (ULTRAM) 50 MG tablet Take 1 tablet (50 mg total) by mouth every 6 (six) hours as needed for up to 5 days for moderate pain., Starting Thu 10/05/2017, Until Tue 10/10/2017, Print         Note:  This document was prepared using Dragon voice recognition software and may include unintentional dictation errors.    Dionne Bucy, MD 10/05/17 1610    Dionne Bucy, MD 10/23/17 2248

## 2017-10-05 NOTE — ED Triage Notes (Signed)
Pt comes into the ED via POV c/o dizziness and fall that occurred on Tuesday.  Patient is still having right side pain from the back down through the legs. Patients ambulation has been unsteady since the fall but no shortning or rotation is noted to the leg.  Patient had an abrasion on her back from the fall as well. Patient denies any LOC and states that she feels better today then she has.

## 2017-11-01 ENCOUNTER — Encounter: Payer: Self-pay | Admitting: Emergency Medicine

## 2017-11-01 ENCOUNTER — Other Ambulatory Visit: Payer: Self-pay

## 2017-11-01 ENCOUNTER — Emergency Department
Admission: EM | Admit: 2017-11-01 | Discharge: 2017-11-01 | Disposition: A | Discharge status: Home / Self Care | Payer: Medicare Other | Attending: Emergency Medicine | Admitting: Emergency Medicine

## 2017-11-01 DIAGNOSIS — R040 Epistaxis: Secondary | ICD-10-CM | POA: Diagnosis not present

## 2017-11-01 DIAGNOSIS — Z7982 Long term (current) use of aspirin: Secondary | ICD-10-CM | POA: Diagnosis not present

## 2017-11-01 DIAGNOSIS — I10 Essential (primary) hypertension: Secondary | ICD-10-CM | POA: Diagnosis not present

## 2017-11-01 DIAGNOSIS — Z79899 Other long term (current) drug therapy: Secondary | ICD-10-CM | POA: Diagnosis not present

## 2017-11-01 LAB — PROTIME-INR
INR: 1.06
PROTHROMBIN TIME: 13.7 s (ref 11.4–15.2)

## 2017-11-01 LAB — CBC WITH DIFFERENTIAL/PLATELET
Basophils Absolute: 0.1 10*3/uL (ref 0–0.1)
Basophils Relative: 1 %
EOS PCT: 1 %
Eosinophils Absolute: 0.1 10*3/uL (ref 0–0.7)
HCT: 38.7 % (ref 35.0–47.0)
HEMOGLOBIN: 13.2 g/dL (ref 12.0–16.0)
LYMPHS ABS: 1.2 10*3/uL (ref 1.0–3.6)
Lymphocytes Relative: 13 %
MCH: 29.5 pg (ref 26.0–34.0)
MCHC: 34 g/dL (ref 32.0–36.0)
MCV: 86.7 fL (ref 80.0–100.0)
Monocytes Absolute: 0.7 10*3/uL (ref 0.2–0.9)
Monocytes Relative: 8 %
NEUTROS PCT: 77 %
Neutro Abs: 7.1 10*3/uL — ABNORMAL HIGH (ref 1.4–6.5)
Platelets: 242 10*3/uL (ref 150–440)
RBC: 4.47 MIL/uL (ref 3.80–5.20)
RDW: 23.2 % — ABNORMAL HIGH (ref 11.5–14.5)
WBC: 9.2 10*3/uL (ref 3.6–11.0)

## 2017-11-01 LAB — COMPREHENSIVE METABOLIC PANEL
ALK PHOS: 91 U/L (ref 38–126)
ALT: 34 U/L (ref 14–54)
AST: 46 U/L — ABNORMAL HIGH (ref 15–41)
Albumin: 4 g/dL (ref 3.5–5.0)
Anion gap: 9 (ref 5–15)
BUN: 29 mg/dL — ABNORMAL HIGH (ref 6–20)
CALCIUM: 9.6 mg/dL (ref 8.9–10.3)
CO2: 25 mmol/L (ref 22–32)
CREATININE: 1.43 mg/dL — AB (ref 0.44–1.00)
Chloride: 107 mmol/L (ref 101–111)
GFR, EST AFRICAN AMERICAN: 41 mL/min — AB (ref >60)
GFR, EST NON AFRICAN AMERICAN: 36 mL/min — AB (ref >60)
Glucose, Bld: 105 mg/dL — ABNORMAL HIGH (ref 65–99)
Potassium: 3.2 mmol/L — ABNORMAL LOW (ref 3.5–5.1)
Sodium: 141 mmol/L (ref 135–145)
Total Bilirubin: 1.4 mg/dL — ABNORMAL HIGH (ref 0.3–1.2)
Total Protein: 7.5 g/dL (ref 6.5–8.1)

## 2017-11-01 MED ORDER — OXYMETAZOLINE HCL 0.05 % NA SOLN
1.0000 | Freq: Once | NASAL | Status: AC
  Administered 2017-11-01: 1 via NASAL
  Filled 2017-11-01: qty 15

## 2017-11-01 NOTE — ED Triage Notes (Signed)
Pt here with c/o nosebleed that began this morning and has not stopped per pt. She went to see her dr who put packing in her nose, but sent her here to be seen by ED.  Pt denies pain, NAD.

## 2017-11-01 NOTE — ED Triage Notes (Signed)
Denies blood thinners, is on asa, packing in nose intact, no active bleed noted at this time.

## 2017-11-01 NOTE — ED Triage Notes (Signed)
Pt sent by Dr. Everlene Ballsejan-si's office for nose bleed times 3 hours.

## 2017-11-01 NOTE — ED Provider Notes (Signed)
[facility] Emergency Department Provider Note  Time seen: 3:39 PM  I have reviewed the triage vital signs and the nursing notes.   HISTORY  Chief Complaint Epistaxis    HPI [name] [name] is a 72 y.o. female with a past medical history of hepatitis, hypertension, presents to the emergency department for nosebleed.  According to the patient she had a nosebleed lasting proximal me 3 or 4 hours this morning.  Went to her doctor who placed a nostril sponge in the right nostril and sent her to the emergency department.  Unfortunately patient waited proximal me 3 hours in the emergency department before being seen.  By the time I saw the patient the nosebleed had stopped.  Patient denies any dizziness or lightheadedness, still has packing in place in the right nostril.  States she has never had a nosebleed before takes a baby aspirin as her only anticoagulation.  Largely negative review of systems otherwise.  Does state seasonal allergies but denies any increased sneezing or congestion.    Past Medical History:  Diagnosis Date  . Hepatitis   . Hypertension     Patient Active Problem List   Diagnosis Date Noted  . Abnormal LFTs 02/20/2017    Past Surgical History:  Procedure Laterality Date  . ABDOMINAL HYSTERECTOMY      Prior to Admission medications   Medication Sig Start Date End Date Taking? Authorizing Provider  amLODipine (NORVASC) 5 MG tablet Take 5 mg by mouth daily. 10/03/17   [provider]  aspirin EC 81 MG tablet Take 81 mg by mouth daily.     [provider]  azaTHIOprine (IMURAN) 50 MG tablet Take 50 mg by mouth daily. 09/22/17   [provider]  benazepril (LOTENSIN) 40 MG tablet Take 40 mg by mouth daily.     [provider]  metoprolol tartrate (LOPRESSOR) 50 MG tablet Take 50 mg by mouth daily.    [provider]  Misc. Devices (WALKER) MISC 1 Device by Does not apply route as directed. 10/05/17    Dionne BucySiadecki, Sebastian, MD  predniSONE (DELTASONE) 5 MG tablet Take 7.5 mg by mouth daily. 09/17/17   [provider]    Allergies  Allergen Reactions  . Sulfa Antibiotics Nausea And Vomiting    Pt has not had a sulfa drug in years and is not certain of reaction but suspects nausea and vomiting.    No family history on file.  Social History Social History   Tobacco Use  . Smoking status: Never Smoker  . Smokeless tobacco: Never Used  Substance Use Topics  . Alcohol use: No  . Drug use: No    Review of Systems Constitutional: Negative for fever. Eyes: Negative for visual complaints ENT: Negative for recent illness/congestion positive for nosebleed today. Cardiovascular: Negative for chest pain. Respiratory: Negative for shortness of breath. Gastrointestinal: Negative for abdominal pain.  Negative for vomiting. Musculoskeletal: States back pain from a fall 2 weeks ago. All other ROS negative  ____________________________________________   PHYSICAL EXAM:  VITAL SIGNS: ED Triage Vitals  Enc Vitals Group     BP 11/01/17 1255 97/82     Pulse Rate 11/01/17 1255 (!) 114     Resp 11/01/17 1255 16     Temp 11/01/17 1255 97.9 F (36.6 C)     Temp Source 11/01/17 1255 Oral     SpO2 11/01/17 1255 95 %     Weight 11/01/17 1256 120 lb (54.4 kg)  Height 11/01/17 1256 5\' 3"  (1.6 m)     Head Circumference --      Peak Flow --      Pain Score 11/01/17 1256 0     Pain Loc --      Pain Edu? --      Excl. in GC? --     Constitutional: Alert and oriented. Well appearing and in no distress. Eyes: Normal exam ENT   Head: Normocephalic and atraumatic.   Nose: Sponge packed in right nostril.  No blood in the left nostril.   Mouth/Throat: Mucous membranes are moist. Cardiovascular: Normal rate, regular rhythm.  Respiratory: Normal respiratory effort without tachypnea nor retractions. Breath sounds are clear Gastrointestinal: Soft and nontender. No distention.   Musculoskeletal: Nontender with normal range of motion in all extremities. Neurologic:  Normal speech and language. No gross focal neurologic deficits Skin:  Skin is warm, dry and intact.  Psychiatric: Mood and affect are normal.     INITIAL IMPRESSION / ASSESSMENT AND PLAN / ED COURSE  Pertinent labs & imaging results that were available during my care of the patient were reviewed by me and considered in my medical decision making (see chart for details).  Patient presents to the emergency department for nosebleed lasting approximately 3 hours this morning.  Patient presents with a sponge in her right nostril.  I irrigated the sponge and removed it from her right nostril.  Patient is nose remained hemostatic.  Had the patient blow out her nose to remove any clot, still remains hemostatic with no signs of bleeding.  I discussed with the patient had use Afrin and a nasal clamp if bleeding recurs and to follow-up with the ENT if bleeding recurs.  Patient was supposed to be a [place] clinic today for a lab draw for a CBC, BMP and INR.  Patient is requesting that we draw this in the emergency department.  I believe this is reasonable as the patient has been waiting greater than 3 hours and missed her appointment.  We will check lab work for the patient.  She does not want to wait for the results.  Patient states her doctor will follow up with her on Monday. ____________________________________________   FINAL CLINICAL IMPRESSION(S) / ED DIAGNOSES  Epistaxis, resolved    Minna Antis, MD 11/01/17 1542

## 2017-11-01 NOTE — ED Notes (Signed)
Pt taken to her POV in wheelchair with husband. VSS. NAD. Discharge instructions, RX and follow up discussed. All questions answered.

## 2017-12-13 ENCOUNTER — Other Ambulatory Visit: Payer: Self-pay | Admitting: Nephrology

## 2017-12-13 DIAGNOSIS — N179 Acute kidney failure, unspecified: Secondary | ICD-10-CM

## 2017-12-13 DIAGNOSIS — N183 Chronic kidney disease, stage 3 unspecified: Secondary | ICD-10-CM

## 2017-12-13 DIAGNOSIS — R809 Proteinuria, unspecified: Secondary | ICD-10-CM

## 2017-12-19 ENCOUNTER — Other Ambulatory Visit: Payer: Self-pay | Admitting: Internal Medicine

## 2017-12-19 ENCOUNTER — Ambulatory Visit
Admission: RE | Admit: 2017-12-19 | Discharge: 2017-12-19 | Disposition: A | Discharge status: Home / Self Care | Payer: Medicare Other | Source: Ambulatory Visit | Attending: Nephrology | Admitting: Nephrology

## 2017-12-19 DIAGNOSIS — R809 Proteinuria, unspecified: Secondary | ICD-10-CM

## 2017-12-19 DIAGNOSIS — N183 Chronic kidney disease, stage 3 unspecified: Secondary | ICD-10-CM

## 2017-12-19 DIAGNOSIS — N179 Acute kidney failure, unspecified: Secondary | ICD-10-CM | POA: Diagnosis not present

## 2017-12-19 DIAGNOSIS — Z1231 Encounter for screening mammogram for malignant neoplasm of breast: Secondary | ICD-10-CM

## 2018-01-05 ENCOUNTER — Ambulatory Visit
Admission: RE | Admit: 2018-01-05 | Discharge: 2018-01-05 | Disposition: A | Discharge status: Home / Self Care | Payer: Medicare Other | Source: Ambulatory Visit | Attending: Internal Medicine | Admitting: Internal Medicine

## 2018-01-05 DIAGNOSIS — Z1231 Encounter for screening mammogram for malignant neoplasm of breast: Secondary | ICD-10-CM

## 2018-03-05 ENCOUNTER — Other Ambulatory Visit: Payer: Self-pay | Admitting: Nurse Practitioner

## 2018-03-05 DIAGNOSIS — K862 Cyst of pancreas: Secondary | ICD-10-CM

## 2018-03-05 DIAGNOSIS — K754 Autoimmune hepatitis: Secondary | ICD-10-CM

## 2018-03-12 ENCOUNTER — Other Ambulatory Visit
Admission: RE | Admit: 2018-03-12 | Discharge: 2018-03-12 | Disposition: A | Discharge status: Home / Self Care | Payer: Medicare Other | Source: Ambulatory Visit | Attending: Nurse Practitioner | Admitting: Nurse Practitioner

## 2018-03-12 DIAGNOSIS — Z79899 Other long term (current) drug therapy: Secondary | ICD-10-CM | POA: Diagnosis not present

## 2018-03-12 DIAGNOSIS — K754 Autoimmune hepatitis: Secondary | ICD-10-CM | POA: Insufficient documentation

## 2018-03-12 LAB — CORTISOL: Cortisol, Plasma: 8.1 ug/dL

## 2018-03-13 LAB — ACTH: C206 ACTH: 3 pg/mL — ABNORMAL LOW (ref 7.2–63.3)

## 2018-03-21 ENCOUNTER — Other Ambulatory Visit
Admission: RE | Admit: 2018-03-21 | Discharge: 2018-03-21 | Disposition: A | Discharge status: Home / Self Care | Payer: Medicare Other | Source: Ambulatory Visit | Attending: Nurse Practitioner | Admitting: Nurse Practitioner

## 2018-03-21 DIAGNOSIS — K754 Autoimmune hepatitis: Secondary | ICD-10-CM | POA: Insufficient documentation

## 2018-04-02 LAB — MISCELLANEOUS TEST

## 2018-04-04 LAB — MISCELLANEOUS TEST: Miscellaneous Test: 3200

## 2018-04-08 ENCOUNTER — Ambulatory Visit
Admission: RE | Admit: 2018-04-08 | Discharge: 2018-04-08 | Disposition: A | Discharge status: Home / Self Care | Payer: Medicare Other | Source: Ambulatory Visit | Attending: Nurse Practitioner | Admitting: Nurse Practitioner

## 2018-04-08 ENCOUNTER — Other Ambulatory Visit: Payer: Self-pay | Admitting: Nurse Practitioner

## 2018-04-08 DIAGNOSIS — K862 Cyst of pancreas: Secondary | ICD-10-CM | POA: Diagnosis not present

## 2018-04-08 DIAGNOSIS — K449 Diaphragmatic hernia without obstruction or gangrene: Secondary | ICD-10-CM | POA: Insufficient documentation

## 2018-04-08 DIAGNOSIS — K7689 Other specified diseases of liver: Secondary | ICD-10-CM | POA: Insufficient documentation

## 2018-04-08 DIAGNOSIS — K754 Autoimmune hepatitis: Secondary | ICD-10-CM | POA: Diagnosis not present

## 2018-04-08 MED ORDER — GADOBENATE DIMEGLUMINE 529 MG/ML IV SOLN
14.0000 mL | Freq: Once | INTRAVENOUS | Status: AC | PRN
  Administered 2018-04-08: 14 mL via INTRAVENOUS

## 2018-05-02 ENCOUNTER — Other Ambulatory Visit
Admission: RE | Admit: 2018-05-02 | Discharge: 2018-05-02 | Disposition: A | Discharge status: Home / Self Care | Payer: Medicare Other | Source: Ambulatory Visit | Attending: Nurse Practitioner | Admitting: Nurse Practitioner

## 2018-05-02 DIAGNOSIS — Z7952 Long term (current) use of systemic steroids: Secondary | ICD-10-CM | POA: Insufficient documentation

## 2018-05-02 DIAGNOSIS — K754 Autoimmune hepatitis: Secondary | ICD-10-CM | POA: Insufficient documentation

## 2018-05-02 LAB — CBC WITH DIFFERENTIAL/PLATELET
Abs Immature Granulocytes: 0.07 10*3/uL (ref 0.00–0.07)
Basophils Absolute: 0 10*3/uL (ref 0.0–0.1)
Basophils Relative: 0 %
Eosinophils Absolute: 0.1 10*3/uL (ref 0.0–0.5)
Eosinophils Relative: 1 %
HCT: 38.3 % (ref 36.0–46.0)
Hemoglobin: 11.4 g/dL — ABNORMAL LOW (ref 12.0–15.0)
Immature Granulocytes: 1 %
Lymphocytes Relative: 9 %
Lymphs Abs: 0.8 10*3/uL (ref 0.7–4.0)
MCH: 26 pg (ref 26.0–34.0)
MCHC: 29.8 g/dL — ABNORMAL LOW (ref 30.0–36.0)
MCV: 87.2 fL (ref 80.0–100.0)
Monocytes Absolute: 0.8 10*3/uL (ref 0.1–1.0)
Monocytes Relative: 9 %
Neutro Abs: 7.1 10*3/uL (ref 1.7–7.7)
Neutrophils Relative %: 80 %
Platelets: 182 10*3/uL (ref 150–400)
RBC: 4.39 MIL/uL (ref 3.87–5.11)
RDW: 16.5 % — ABNORMAL HIGH (ref 11.5–15.5)
WBC: 8.9 10*3/uL (ref 4.0–10.5)
nRBC: 0 % (ref 0.0–0.2)

## 2018-05-02 LAB — BASIC METABOLIC PANEL
Anion gap: 9 (ref 5–15)
BUN: 22 mg/dL (ref 8–23)
CO2: 26 mmol/L (ref 22–32)
Calcium: 9.2 mg/dL (ref 8.9–10.3)
Chloride: 106 mmol/L (ref 98–111)
Creatinine, Ser: 1.11 mg/dL — ABNORMAL HIGH (ref 0.44–1.00)
GFR calc Af Amer: 56 mL/min — ABNORMAL LOW (ref >60)
GFR calc non Af Amer: 48 mL/min — ABNORMAL LOW (ref >60)
Glucose, Bld: 96 mg/dL (ref 70–99)
Potassium: 3.3 mmol/L — ABNORMAL LOW (ref 3.5–5.1)
Sodium: 141 mmol/L (ref 135–145)

## 2018-05-02 LAB — HEPATIC FUNCTION PANEL
ALT: 13 U/L (ref 0–44)
AST: 22 U/L (ref 15–41)
Albumin: 3.8 g/dL (ref 3.5–5.0)
Alkaline Phosphatase: 48 U/L (ref 38–126)
Bilirubin, Direct: <0.1 mg/dL (ref 0.0–0.2)
Total Bilirubin: 0.8 mg/dL (ref 0.3–1.2)
Total Protein: 7 g/dL (ref 6.5–8.1)

## 2018-05-02 LAB — PROTIME-INR
INR: 1.03
Prothrombin Time: 13.4 seconds (ref 11.4–15.2)

## 2018-05-03 LAB — MITOCHONDRIAL ANTIBODIES: Mitochondrial M2 Ab, IgG: 145.2 Units — ABNORMAL HIGH (ref 0.0–20.0)

## 2018-05-03 LAB — ANTI-SMOOTH MUSCLE ANTIBODY, IGG: F-Actin IgG: 21 Units — ABNORMAL HIGH (ref 0–19)

## 2018-05-08 LAB — MISCELLANEOUS TEST

## 2018-08-24 ENCOUNTER — Encounter: Payer: Self-pay | Admitting: *Deleted

## 2018-08-27 ENCOUNTER — Ambulatory Visit: Payer: Medicare Other | Admitting: Anesthesiology

## 2018-08-27 ENCOUNTER — Encounter
Admission: RE | Disposition: A | Discharge status: Home / Self Care | Payer: Self-pay | Source: Home / Self Care | Attending: Unknown Physician Specialty

## 2018-08-27 ENCOUNTER — Encounter: Payer: Self-pay | Admitting: *Deleted

## 2018-08-27 ENCOUNTER — Ambulatory Visit
Admission: RE | Admit: 2018-08-27 | Discharge: 2018-08-27 | Disposition: A | Discharge status: Home / Self Care | Payer: Medicare Other | Attending: Unknown Physician Specialty | Admitting: Unknown Physician Specialty

## 2018-08-27 DIAGNOSIS — K449 Diaphragmatic hernia without obstruction or gangrene: Secondary | ICD-10-CM | POA: Diagnosis not present

## 2018-08-27 DIAGNOSIS — Z79899 Other long term (current) drug therapy: Secondary | ICD-10-CM | POA: Insufficient documentation

## 2018-08-27 DIAGNOSIS — K5730 Diverticulosis of large intestine without perforation or abscess without bleeding: Secondary | ICD-10-CM | POA: Insufficient documentation

## 2018-08-27 DIAGNOSIS — B9689 Other specified bacterial agents as the cause of diseases classified elsewhere: Secondary | ICD-10-CM | POA: Insufficient documentation

## 2018-08-27 DIAGNOSIS — K648 Other hemorrhoids: Secondary | ICD-10-CM | POA: Diagnosis not present

## 2018-08-27 DIAGNOSIS — Z7982 Long term (current) use of aspirin: Secondary | ICD-10-CM | POA: Insufficient documentation

## 2018-08-27 DIAGNOSIS — I10 Essential (primary) hypertension: Secondary | ICD-10-CM | POA: Insufficient documentation

## 2018-08-27 DIAGNOSIS — K2950 Unspecified chronic gastritis without bleeding: Secondary | ICD-10-CM | POA: Diagnosis not present

## 2018-08-27 DIAGNOSIS — E785 Hyperlipidemia, unspecified: Secondary | ICD-10-CM | POA: Insufficient documentation

## 2018-08-27 DIAGNOSIS — Z882 Allergy status to sulfonamides status: Secondary | ICD-10-CM | POA: Diagnosis not present

## 2018-08-27 DIAGNOSIS — D509 Iron deficiency anemia, unspecified: Secondary | ICD-10-CM | POA: Diagnosis not present

## 2018-08-27 DIAGNOSIS — Z7952 Long term (current) use of systemic steroids: Secondary | ICD-10-CM | POA: Insufficient documentation

## 2018-08-27 DIAGNOSIS — Z881 Allergy status to other antibiotic agents status: Secondary | ICD-10-CM | POA: Diagnosis not present

## 2018-08-27 HISTORY — DX: Age-related osteoporosis without current pathological fracture: M81.0

## 2018-08-27 HISTORY — DX: Other specified abnormal findings of blood chemistry: R79.89

## 2018-08-27 HISTORY — DX: Hyperlipidemia, unspecified: E78.5

## 2018-08-27 HISTORY — DX: Dyspnea, unspecified: R06.00

## 2018-08-27 HISTORY — PX: COLONOSCOPY WITH PROPOFOL: SHX5780

## 2018-08-27 HISTORY — PX: ESOPHAGOGASTRODUODENOSCOPY (EGD) WITH PROPOFOL: SHX5813

## 2018-08-27 HISTORY — DX: Abnormal results of liver function studies: R94.5

## 2018-08-27 HISTORY — DX: Autoimmune hepatitis: K75.4

## 2018-08-27 HISTORY — DX: Other forms of dyspnea: R06.09

## 2018-08-27 SURGERY — COLONOSCOPY WITH PROPOFOL
Anesthesia: General

## 2018-08-27 MED ORDER — SODIUM CHLORIDE 0.9 % IV SOLN: INTRAVENOUS | Status: DC

## 2018-08-27 MED ORDER — LIDOCAINE HCL (PF) 2 % IJ SOLN
INTRAMUSCULAR | Status: AC
  Filled 2018-08-27: qty 10

## 2018-08-27 MED ORDER — FENTANYL CITRATE (PF) 100 MCG/2ML IJ SOLN
INTRAMUSCULAR | Status: AC
  Filled 2018-08-27: qty 2

## 2018-08-27 MED ORDER — LIDOCAINE HCL (PF) 2 % IJ SOLN
INTRAMUSCULAR | Status: DC | PRN
  Administered 2018-08-27: 60 mg

## 2018-08-27 MED ORDER — FENTANYL CITRATE (PF) 100 MCG/2ML IJ SOLN
INTRAMUSCULAR | Status: DC | PRN
  Administered 2018-08-27 (×3): 25 ug via INTRAVENOUS

## 2018-08-27 MED ORDER — PROPOFOL 500 MG/50ML IV EMUL
INTRAVENOUS | Status: DC | PRN
  Administered 2018-08-27: 50 ug/kg/min via INTRAVENOUS

## 2018-08-27 MED ORDER — PROPOFOL 10 MG/ML IV BOLUS
INTRAVENOUS | Status: DC | PRN
  Administered 2018-08-27 (×2): 20 mg via INTRAVENOUS

## 2018-08-27 MED ORDER — SODIUM CHLORIDE 0.9 % IV SOLN
INTRAVENOUS | Status: DC
  Administered 2018-08-27: 1000 mL via INTRAVENOUS

## 2018-08-27 MED ORDER — MIDAZOLAM HCL 5 MG/5ML IJ SOLN
INTRAMUSCULAR | Status: DC | PRN
  Administered 2018-08-27 (×2): 0.5 mg via INTRAVENOUS

## 2018-08-27 MED ORDER — PROPOFOL 500 MG/50ML IV EMUL
INTRAVENOUS | Status: AC
  Filled 2018-08-27: qty 50

## 2018-08-27 MED ORDER — MIDAZOLAM HCL 2 MG/2ML IJ SOLN
INTRAMUSCULAR | Status: AC
  Filled 2018-08-27: qty 2

## 2018-08-27 NOTE — Anesthesia Preprocedure Evaluation (Signed)
Anesthesia Evaluation  Patient identified by MRN, date of birth, ID band Patient awake    Reviewed: Allergy & Precautions, H&P , NPO status , Patient's Chart, lab work & pertinent test results, reviewed documented beta blocker date and time   History of Anesthesia Complications Negative for: history of anesthetic complications  Airway Mallampati: II  TM Distance: >3 FB Neck ROM: full    Dental  (+) Dental Advidsory Given, Teeth Intact   Pulmonary neg pulmonary ROS,           Cardiovascular Exercise Tolerance: Good hypertension, (-) angina(-) CAD and (-) Past MI (-) dysrhythmias (-) Valvular Problems/Murmurs     Neuro/Psych negative neurological ROS  negative psych ROS   GI/Hepatic negative GI ROS, (+) Hepatitis -, Autoimmune  Endo/Other  negative endocrine ROS  Renal/GU negative Renal ROS  negative genitourinary   Musculoskeletal   Abdominal   Peds  Hematology negative hematology ROS (+)   Anesthesia Other Findings Past Medical History: No date: Abnormal LFTs (liver function tests) No date: Autoimmune hepatitis treated with steroids (HCC) No date: DOE (dyspnea on exertion) No date: Hepatitis No date: Hyperlipidemia No date: Hypertension No date: Osteoporosis   Reproductive/Obstetrics negative OB ROS                             Anesthesia Physical Anesthesia Plan  ASA: II  Anesthesia Plan: General   Post-op Pain Management:    Induction: Intravenous  PONV Risk Score and Plan: 3 and Propofol infusion and TIVA  Airway Management Planned: Natural Airway and Nasal Cannula  Additional Equipment:   Intra-op Plan:   Post-operative Plan:   Informed Consent: I have reviewed the patients History and Physical, chart, labs and discussed the procedure including the risks, benefits and alternatives for the proposed anesthesia with the patient or authorized representative who has  indicated his/her understanding and acceptance.     Dental Advisory Given  Plan Discussed with: Anesthesiologist, CRNA and Surgeon  Anesthesia Plan Comments:         Anesthesia Quick Evaluation

## 2018-08-27 NOTE — H&P (Signed)
Primary Care Physician:  Margaretann Loveless, MD Primary Gastroenterologist:  Dr. Mechele Collin  Pre-Procedure History & Physical: HPI:  [name] [name] is a 73 y.o. female is here for an endoscopy and colonoscopy.   Past Medical History:  Diagnosis Date  . Abnormal LFTs (liver function tests)   . Autoimmune hepatitis treated with steroids (HCC)   . DOE (dyspnea on exertion)   . Hepatitis   . Hyperlipidemia   . Hypertension   . Osteoporosis     Past Surgical History:  Procedure Laterality Date  . ABDOMINAL HYSTERECTOMY    . COLONOSCOPY  10/06/2015  . LIVER BIOPSY      Prior to Admission medications   Medication Sig Start Date End Date Taking? Authorizing Provider  alendronate (FOSAMAX) 70 MG tablet Take 70 mg by mouth once a week. Take with a full glass of water on an empty stomach.   Yes [provider]  amLODipine (NORVASC) 5 MG tablet Take 5 mg by mouth daily. 10/03/17  Yes [provider]  aspirin EC 81 MG tablet Take 81 mg by mouth daily.    Yes [provider]  azaTHIOprine (IMURAN) 50 MG tablet Take 50 mg by mouth daily. 09/22/17  Yes [provider]  benazepril (LOTENSIN) 40 MG tablet Take 40 mg by mouth daily.    Yes [provider]  Calcium Carbonate Antacid 600 MG chewable tablet Chew 600 mg by mouth.   Yes [provider]  calcium citrate-vitamin D (CITRACAL+D) 315-200 MG-UNIT tablet Take 1 tablet by mouth 2 (two) times daily.   Yes [provider]  cyanocobalamin 1000 MCG tablet Take 1,000 mcg by mouth daily.   Yes [provider]  Ferrous Fumarate (HEMOCYTE - 106 MG FE) 324 (106 Fe) MG TABS tablet Take 1 tablet by mouth.   Yes [provider]  metoprolol tartrate (LOPRESSOR) 50 MG tablet Take 50 mg by mouth daily.   Yes [provider]  predniSONE (DELTASONE) 5 MG tablet Take 7.5 mg by mouth daily. 09/17/17  Yes [provider]  Misc. Devices (WALKER) MISC 1 Device by Does not  apply route as directed. 10/05/17   Dionne Bucy, MD    Allergies as of 07/02/2018 - Review Complete 11/01/2017  Allergen Reaction Noted  . Sulfa antibiotics Nausea And Vomiting 02/20/2017    History reviewed. No pertinent family history.  Social History   Socioeconomic History  . Marital status: Married    Spouse name: Not on file  . Number of children: Not on file  . Years of education: Not on file  . Highest education level: Not on file  Occupational History  . Not on file  Social Needs  . Financial resource strain: Not on file  . Food insecurity:    Worry: Not on file    Inability: Not on file  . Transportation needs:    Medical: Not on file    Non-medical: Not on file  Tobacco Use  . Smoking status: Never Smoker  . Smokeless tobacco: Never Used  Substance and Sexual Activity  . Alcohol use: No  . Drug use: Never  . Sexual activity: Not on file  Lifestyle  . Physical activity:    Days per week: Not on file    Minutes per session: Not on file  . Stress: Not on file  Relationships  . Social connections:    Talks on phone: Not on file    Gets together: Not on file  Attends religious service: Not on file    Active member of club or organization: Not on file    Attends meetings of clubs or organizations: Not on file    Relationship status: Not on file  . Intimate partner violence:    Fear of current or ex partner: Not on file    Emotionally abused: Not on file    Physically abused: Not on file    Forced sexual activity: Not on file  Other Topics Concern  . Not on file  Social History Narrative  . Not on file    Review of Systems: See HPI, otherwise negative ROS  Physical Exam: BP (!) 152/86   Pulse 63   Temp (!) 97.2 F (36.2 C) (Tympanic)   Resp 18   Ht 5\' 3"  (1.6 m)   Wt 72.6 kg   SpO2 97%   BMI 28.34 kg/m  General:   Alert,  pleasant and cooperative in NAD Head:  Normocephalic and atraumatic. Neck:  Supple; no masses or  thyromegaly. Lungs:  Clear throughout to auscultation.    Heart:  Regular rate and rhythm. Abdomen:  Soft, nontender and nondistended. Normal bowel sounds, without guarding, and without rebound.   Neurologic:  Alert and  oriented x4;  grossly normal neurologically.  Impression/Plan: [name] [name] is here for an endoscopy and colonoscopy to be performed for Iron def anemia.  Patient with previous colonoscopy showing diverticulosis.  Risks, benefits, limitations, and alternatives regarding  colonoscopy have been reviewed with the patient.  Questions have been answered.  All parties agreeable.   Lynnae Prude, MD  08/27/2018, 8:50 AM

## 2018-08-27 NOTE — Op Note (Signed)
[facility] Gastroenterology Patient Name: [name] [name] Procedure Date: 08/27/2018 8:43 AM MRN: [id number] Account #: [id number] Date of Birth: [birth year] Admit Type: Outpatient Age: 73 Room: [facility] ENDO ROOM 1 Gender: Female Note Status: Finalized Procedure:            Colonoscopy Indications:          Unexplained iron deficiency anemia Providers:            Scot Jun, MD Referring MD:         Margaretann Loveless, MD (Referring MD) Medicines:            Propofol per Anesthesia Complications:        No immediate complications. Procedure:            Pre-Anesthesia Assessment:                       - After reviewing the risks and benefits, the patient                        was deemed in satisfactory condition to undergo the                        procedure.                       After obtaining informed consent, the colonoscope was                        passed under direct vision. Throughout the procedure,                        the patient's blood pressure, pulse, and oxygen                        saturations were monitored continuously. The was                        introduced through the anus and advanced to the the                        cecum, identified by appendiceal orifice and ileocecal                        valve. The colonoscopy was performed without                        difficulty. The patient tolerated the procedure well.                        The quality of the bowel preparation was good. Findings:      Many small-mouthed diverticula were found in the sigmoid colon.      Internal hemorrhoids were found during endoscopy. The hemorrhoids were       small and Grade I (internal hemorrhoids that do not prolapse).      The exam was otherwise without abnormality. Impression:           - Diverticulosis in the sigmoid colon.                       - Internal hemorrhoids.                       -  The examination was otherwise normal.     - No specimens collected. Recommendation:       - The findings and recommendations were discussed with                        the patient's family. Take iron supplements. Scot Junobert T Elliott, MD 08/27/2018 9:26:49 AM This report has been signed electronically. Number of Addenda: 0 Note Initiated On: 08/27/2018 8:43 AM Scope Withdrawal Time: 0 hours 4 minutes 3 seconds  Total Procedure Duration: 0 hours 10 minutes 48 seconds       [facility]

## 2018-08-27 NOTE — Transfer of Care (Signed)
Immediate Anesthesia Transfer of Care Note  Patient: [name]  Procedure(s) Performed: COLONOSCOPY WITH PROPOFOL (N/A ) ESOPHAGOGASTRODUODENOSCOPY (EGD) WITH PROPOFOL (N/A )  Patient Location: PACU  Anesthesia Type:General  Level of Consciousness: sedated  Airway & Oxygen Therapy: Patient Spontanous Breathing and Patient connected to nasal cannula oxygen  Post-op Assessment: Report given to RN and Post -op Vital signs reviewed and stable  Post vital signs: Reviewed and stable  Last Vitals:  Vitals Value Taken Time  BP 105/66 08/27/2018  9:27 AM  Temp 36.1 C 08/27/2018  9:26 AM  Pulse 66 08/27/2018  9:28 AM  Resp 15 08/27/2018  9:28 AM  SpO2 92 % 08/27/2018  9:28 AM  Vitals shown include unvalidated device data.  Last Pain:  Vitals:   08/27/18 0926  TempSrc: Tympanic  PainSc: 0-No pain         Complications: No apparent anesthesia complications

## 2018-08-27 NOTE — Anesthesia Postprocedure Evaluation (Signed)
Anesthesia Post Note  Patient: [name] [name]  Procedure(s) Performed: COLONOSCOPY WITH PROPOFOL (N/A ) ESOPHAGOGASTRODUODENOSCOPY (EGD) WITH PROPOFOL (N/A )  Patient location during evaluation: Endoscopy Anesthesia Type: General Level of consciousness: awake and alert Pain management: pain level controlled Vital Signs Assessment: post-procedure vital signs reviewed and stable Respiratory status: spontaneous breathing, nonlabored ventilation, respiratory function stable and patient connected to nasal cannula oxygen Cardiovascular status: blood pressure returned to baseline and stable Postop Assessment: no apparent nausea or vomiting Anesthetic complications: no     Last Vitals:  Vitals:   08/27/18 0946 08/27/18 0956  BP: 128/79 134/69  Pulse: 67 67  Resp: 17 13  Temp:    SpO2: 92% 94%    Last Pain:  Vitals:   08/27/18 0956  TempSrc:   PainSc: 0-No pain                 Lenard Simmer

## 2018-08-27 NOTE — Op Note (Signed)
[facility] Gastroenterology Patient Name: [name] [name] Procedure Date: 08/27/2018 8:44 AM MRN: [id number] Account #: [id number] Date of Birth: 1946-05-22 Admit Type: Outpatient Age: 73 Room: [facility] ENDO ROOM 1 Gender: Female Note Status: Finalized Procedure:            Upper GI endoscopy Indications:          Unexplained iron deficiency anemia Providers:            Scot Jun, MD Referring MD:         Margaretann Loveless, MD (Referring MD) Medicines:            Propofol per Anesthesia Complications:        No immediate complications. Procedure:            Pre-Anesthesia Assessment:                       - After reviewing the risks and benefits, the patient                        was deemed in satisfactory condition to undergo the                        procedure.                       After obtaining informed consent, the endoscope was                        passed under direct vision. Throughout the procedure,                        the patient's blood pressure, pulse, and oxygen                        saturations were monitored continuously. The Endoscope                        was introduced through the mouth, and advanced to the                        second part of duodenum. The upper GI endoscopy was                        accomplished without difficulty. The patient tolerated                        the procedure well. Findings:      The examined esophagus was normal. GEJ at 32cm.      Patchy moderate inflammation characterized by erosions, erythema and       granularity was found in the gastric body and in the gastric antrum.       Biopsies were taken with a cold forceps for histology. Biopsies were       taken with a cold forceps for Helicobacter pylori testing.      The examined duodenum was normal. Impression:           - Normal esophagus.                       - Gastritis. Biopsied.                       -  Normal examined duodenum. Recommendation:        - Await pathology results.                       - Perform a colonoscopy as previously scheduled. Scot Junobert T Elliott, MD 08/27/2018 9:09:28 AM This report has been signed electronically. Number of Addenda: 0 Note Initiated On: 08/27/2018 8:44 AM      [facility]

## 2018-08-27 NOTE — Anesthesia Post-op Follow-up Note (Signed)
Anesthesia QCDR form completed.        

## 2018-08-29 ENCOUNTER — Encounter: Payer: Self-pay | Admitting: Unknown Physician Specialty

## 2018-08-29 LAB — SURGICAL PATHOLOGY

## 2018-09-27 ENCOUNTER — Other Ambulatory Visit: Payer: Self-pay | Admitting: Nurse Practitioner

## 2018-09-27 DIAGNOSIS — K219 Gastro-esophageal reflux disease without esophagitis: Secondary | ICD-10-CM

## 2018-10-04 ENCOUNTER — Ambulatory Visit: Payer: Medicare Other

## 2018-11-05 ENCOUNTER — Ambulatory Visit: Payer: Medicare Other

## 2018-11-28 ENCOUNTER — Other Ambulatory Visit: Payer: Self-pay | Admitting: Nurse Practitioner

## 2018-11-28 DIAGNOSIS — K219 Gastro-esophageal reflux disease without esophagitis: Secondary | ICD-10-CM

## 2018-11-28 DIAGNOSIS — Z01818 Encounter for other preprocedural examination: Secondary | ICD-10-CM

## 2018-12-04 ENCOUNTER — Other Ambulatory Visit: Payer: Self-pay

## 2018-12-04 ENCOUNTER — Ambulatory Visit
Admission: RE | Admit: 2018-12-04 | Discharge: 2018-12-04 | Disposition: A | Discharge status: Home / Self Care | Payer: Medicare Other | Source: Ambulatory Visit | Attending: Nurse Practitioner | Admitting: Nurse Practitioner

## 2018-12-04 DIAGNOSIS — K219 Gastro-esophageal reflux disease without esophagitis: Secondary | ICD-10-CM | POA: Diagnosis not present

## 2018-12-04 DIAGNOSIS — Z01818 Encounter for other preprocedural examination: Secondary | ICD-10-CM

## 2018-12-25 ENCOUNTER — Other Ambulatory Visit (HOSPITAL_COMMUNITY): Payer: Self-pay | Admitting: Nurse Practitioner

## 2018-12-25 ENCOUNTER — Other Ambulatory Visit: Payer: Self-pay | Admitting: Nurse Practitioner

## 2018-12-25 DIAGNOSIS — K754 Autoimmune hepatitis: Secondary | ICD-10-CM

## 2018-12-25 DIAGNOSIS — K2960 Other gastritis without bleeding: Secondary | ICD-10-CM | POA: Insufficient documentation

## 2019-01-03 ENCOUNTER — Other Ambulatory Visit: Payer: Self-pay | Admitting: Family

## 2019-01-03 DIAGNOSIS — Z1231 Encounter for screening mammogram for malignant neoplasm of breast: Secondary | ICD-10-CM

## 2019-01-04 ENCOUNTER — Ambulatory Visit
Admission: RE | Admit: 2019-01-04 | Discharge: 2019-01-04 | Disposition: A | Discharge status: Home / Self Care | Payer: Medicare Other | Source: Ambulatory Visit | Attending: Nurse Practitioner | Admitting: Nurse Practitioner

## 2019-01-04 ENCOUNTER — Other Ambulatory Visit: Payer: Self-pay

## 2019-01-04 ENCOUNTER — Other Ambulatory Visit: Payer: Medicare Other

## 2019-01-04 DIAGNOSIS — K754 Autoimmune hepatitis: Secondary | ICD-10-CM | POA: Diagnosis not present

## 2019-01-04 MED ORDER — GADOBUTROL 1 MMOL/ML IV SOLN
7.0000 mL | Freq: Once | INTRAVENOUS | Status: AC | PRN
  Administered 2019-01-04: 7 mL via INTRAVENOUS

## 2019-02-13 ENCOUNTER — Ambulatory Visit
Admission: RE | Admit: 2019-02-13 | Discharge: 2019-02-13 | Disposition: A | Discharge status: Home / Self Care | Payer: Medicare Other | Source: Ambulatory Visit | Attending: Family | Admitting: Family

## 2019-02-13 DIAGNOSIS — Z1231 Encounter for screening mammogram for malignant neoplasm of breast: Secondary | ICD-10-CM | POA: Diagnosis not present

## 2019-02-27 ENCOUNTER — Ambulatory Visit (INDEPENDENT_AMBULATORY_CARE_PROVIDER_SITE_OTHER): Payer: Medicare Other | Admitting: Urology

## 2019-02-27 ENCOUNTER — Encounter: Payer: Self-pay | Admitting: Urology

## 2019-02-27 ENCOUNTER — Other Ambulatory Visit: Payer: Self-pay

## 2019-02-27 VITALS — BP 154/91 | HR 82 | Ht 63.0 in | Wt 158.0 lb

## 2019-02-27 DIAGNOSIS — R8281 Pyuria: Secondary | ICD-10-CM | POA: Diagnosis not present

## 2019-02-27 NOTE — Progress Notes (Signed)
02/27/2019 11:32 AM   [name] [name] 10/04/1945 [id number]  Referring provider: Perrin Maltese, MD [address] [place],  [name] [address]  Chief Complaint  Patient presents with  . Pyuria    New Patient    HPI: 73 year old female referred for further evaluation of asymptomatic pyuria and start her on urinalysis.  Notably, she recently saw Dr. Leafy Ro last week and noted to have a friable vaginal lesion.  She was started on topical estrogen cream.  She is scheduled to follow-up with her to assess for resolution of this lesion and consideration of biopsy if she fails to improve.  In terms of urination, she has absolutely no urinary problems whatsoever.  She has no history of UTIs.  No history of kidney stones or flank pain.  No urgency or frequency.  No incontinence.  She feels like her bladder is doing fine.  She did seem some blood per vagina but reports it is not mixed in with her urine.  Review of multiple urinalysis review white blood cells only without the presence of blood.  Urine cultures are either negative or with mixed flora.  She did have a history of renal insufficiency last year with a bump in her creatinine to 1.5.  As per this evaluation, she had a renal ultrasound which showed bilateral renal parenchymal thinning but no hydronephrosis stones, or masses.  Her creatinine has since returned to baseline.  She is asymptomatic today.   PMH: Past Medical History:  Diagnosis Date  . Abnormal LFTs (liver function tests)   . Autoimmune hepatitis ([facility]) 2018  . Autoimmune hepatitis ([place])   . Autoimmune hepatitis treated with steroids ([place])   . DOE (dyspnea on exertion)   . Hepatitis   . Hyperlipidemia   . Hypertension   . Osteoporosis     Surgical History: Past Surgical History:  Procedure Laterality Date  . ABDOMINAL HYSTERECTOMY    . COLONOSCOPY  10/06/2015  . COLONOSCOPY WITH PROPOFOL N/A 08/27/2018   Procedure: COLONOSCOPY WITH PROPOFOL;  Surgeon: Manya Silvas, MD;  Location: [facility] ENDOSCOPY;  Service: Endoscopy;  Laterality: N/A;  . ESOPHAGOGASTRODUODENOSCOPY (EGD) WITH PROPOFOL N/A 08/27/2018   Procedure: ESOPHAGOGASTRODUODENOSCOPY (EGD) WITH PROPOFOL;  Surgeon: Manya Silvas, MD;  Location: [facility] ENDOSCOPY;  Service: Endoscopy;  Laterality: N/A;  . LIVER BIOPSY      Home Medications:  Allergies as of 02/27/2019      Reactions   Sulfa Antibiotics Nausea And Vomiting   Pt has not had a sulfa drug in years and is not certain of reaction but suspects nausea and vomiting.   Sulfasalazine Nausea And Vomiting      Medication List       Accurate as of February 27, 2019 11:32 AM. If you have any questions, ask your nurse or doctor.        alendronate 70 MG tablet Commonly known as: FOSAMAX Take 70 mg by mouth once a week. Take with a full glass of water on an empty stomach.   amLODipine 5 MG tablet Commonly known as: NORVASC Take 5 mg by mouth daily.   aspirin EC 81 MG tablet Take 81 mg by mouth daily.   azaTHIOprine 50 MG tablet Commonly known as: IMURAN Take 50 mg by mouth daily.   benazepril 40 MG tablet Commonly known as: LOTENSIN Take 40 mg by mouth daily.   Calcium Carbonate Antacid 600 MG chewable tablet Chew 600 mg by mouth.   calcium citrate-vitamin D 315-200 MG-UNIT tablet Commonly  known as: CITRACAL+D Take 1 tablet by mouth 2 (two) times daily.   cyanocobalamin 1000 MCG tablet Take 1,000 mcg by mouth daily.   estradiol 0.1 MG/GM vaginal cream Commonly known as: ESTRACE Insert pea size amount daily x 2 weeks then every other day x 2 weeks then 2-3 times weekly for maintenance   Ferrous Fumarate 324 (106 Fe) MG Tabs tablet Commonly known as: HEMOCYTE - 106 mg FE Take 1 tablet by mouth.   metoprolol tartrate 50 MG tablet Commonly known as: LOPRESSOR Take 50 mg by mouth daily.   predniSONE 5 MG tablet Commonly known as: DELTASONE Take 7.5 mg by mouth daily.   Walker Misc 1 Device by Does not apply  route as directed.       Allergies:  Allergies  Allergen Reactions  . Sulfa Antibiotics Nausea And Vomiting    Pt has not had a sulfa drug in years and is not certain of reaction but suspects nausea and vomiting.  . Sulfasalazine Nausea And Vomiting    Family History: No family history on file.  Social History:  reports that she has never smoked. She has never used smokeless tobacco. She reports that she does not drink alcohol or use drugs.  ROS: UROLOGY Frequent Urination?: No Hard to postpone urination?: No Burning/pain with urination?: No Get up at night to urinate?: Yes Leakage of urine?: No Urine stream starts and stops?: No Trouble starting stream?: No Do you have to strain to urinate?: No Blood in urine?: No Urinary tract infection?: No Sexually transmitted disease?: No Injury to kidneys or bladder?: No Painful intercourse?: No Weak stream?: No Currently pregnant?: No Vaginal bleeding?: No Last menstrual period?: n  Gastrointestinal Nausea?: No Vomiting?: No Indigestion/heartburn?: No Diarrhea?: No Constipation?: No  Constitutional Fever: No Night sweats?: No Weight loss?: No Fatigue?: No  Skin Skin rash/lesions?: No Itching?: No  Eyes Blurred vision?: No Double vision?: No  Ears/Nose/Throat Sore throat?: No Sinus problems?: No  Hematologic/Lymphatic Swollen glands?: No Easy bruising?: No  Cardiovascular Leg swelling?: No Chest pain?: No  Respiratory Cough?: No Shortness of breath?: No  Endocrine Excessive thirst?: No  Musculoskeletal Back pain?: No Joint pain?: No  Neurological Headaches?: No Dizziness?: No  Psychologic Depression?: No Anxiety?: No  Physical Exam: BP (!) 154/91   Pulse 82   Ht 5\' 3"  (1.6 m)   Wt 158 lb (71.7 kg)   BMI 27.99 kg/m   Constitutional:  Alert and oriented, No acute distress. HEENT: [place] AT, moist mucus membranes.  Trachea midline, no masses. Cardiovascular: No clubbing, cyanosis, or  edema. Respiratory: Normal respiratory effort, no increased work of breathing. GI: Abdomen is soft, nontender, nondistended, no abdominal masses. Skin: No rashes, bruises or suspicious lesions. Neurologic: Grossly intact, no focal deficits, moving all 4 extremities. Psychiatric: Normal mood and affect.  Laboratory Data: Lab Results  Component Value Date   WBC 8.9 05/02/2018   HGB 11.4 (L) 05/02/2018   HCT 38.3 05/02/2018   MCV 87.2 05/02/2018   PLT 182 05/02/2018    Lab Results  Component Value Date   CREATININE 1.11 (H) 05/02/2018      Urinalysis UA today reviewed personally, there is presence of white blood cells in the absence of red blood cells.  UA is otherwise unremarkable.  Pertinent Imaging: Results for orders placed during the hospital encounter of 12/19/17  US RENAL   Narrative CLINICAL DATA:  73 year old female with acute renal insufficiency. Initial encounter.  EXAM: RENAL / URINARY TRACT ULTRASOUND COMPLETE  COMPARISON:  Abdominal MR 02/17/2017.  FINDINGS: Right Kidney:  Length: 10.9 cm. Renal parenchymal thinning. No hydronephrosis. Upper pole 8 mm cyst.  Left Kidney:  Length: 9.7 cm. Evaluation limited by habitus/bowel gas. Renal parenchymal thinning. No obvious mass or hydronephrosis.  Bladder:  Appears normal for degree of bladder distention.  IMPRESSION: Bilateral renal parenchymal thinning.  No hydronephrosis.   Electronically Signed   By: Lacy DuverneySteven  Olson M.D.   On: 12/19/2017 14:05     Assessment & Plan:    1. Pyuria Asymptomatic pyuria This does not require any further urologic evaluation Reassuringly, her creatinine is normal with a fairly unremarkable renal ultrasound last year No further evaluation needed unless she becomes symptomatic We will hold off on urine culture today given the absence of UTI symptoms - Urinalysis, Complete   F/u prn  Vanna ScotlandAshley Azora Bonzo, MD  [facility] [address],  [address] [place], [place] [address] [phone]

## 2019-02-28 LAB — MICROSCOPIC EXAMINATION
RBC, Urine: NONE SEEN /hpf (ref 0–2)
WBC, UA: >30 /hpf — AB (ref 0–5)

## 2019-02-28 LAB — URINALYSIS, COMPLETE
Bilirubin, UA: NEGATIVE
Glucose, UA: NEGATIVE
Nitrite, UA: NEGATIVE
Specific Gravity, UA: 1.02 (ref 1.005–1.030)
Urobilinogen, Ur: 0.2 mg/dL (ref 0.2–1.0)
pH, UA: 6 (ref 5.0–7.5)

## 2019-07-08 DIAGNOSIS — N2581 Secondary hyperparathyroidism of renal origin: Secondary | ICD-10-CM | POA: Insufficient documentation

## 2019-09-15 ENCOUNTER — Ambulatory Visit: Payer: Medicare HMO | Attending: Internal Medicine

## 2019-09-15 DIAGNOSIS — Z23 Encounter for immunization: Secondary | ICD-10-CM | POA: Insufficient documentation

## 2019-09-15 NOTE — Progress Notes (Signed)
   Covid-19 Vaccination Clinic  Name:  FAYDRA KORMAN    MRN: [id number] DOB: 06/13/1946  09/15/2019  Ms. Delia was observed post Covid-19 immunization for 15 minutes without incidence. She was provided with Vaccine Information Sheet and instruction to access the V-Safe system.   Ms. Notaro was instructed to call 911 with any severe reactions post vaccine: Marland Kitchen Difficulty breathing  . Swelling of your face and throat  . A fast heartbeat  . A bad rash all over your body  . Dizziness and weakness    Immunizations Administered    Name Date Dose VIS Date Route   Pfizer COVID-19 Vaccine 09/15/2019  9:12 AM 0.3 mL 06/28/2019 Intramuscular   Manufacturer: [employer], [employer]   Lot: [id number]   NDC: [address]-1

## 2019-10-07 ENCOUNTER — Ambulatory Visit: Payer: Medicare HMO | Attending: Internal Medicine

## 2019-10-07 DIAGNOSIS — Z23 Encounter for immunization: Secondary | ICD-10-CM

## 2019-10-07 NOTE — Progress Notes (Signed)
   Covid-19 Vaccination Clinic  Name:  [name] [name]    MRN: [id number] DOB: 11-05-1945  10/07/2019  [name] was observed post Covid-19 immunization for 15 minutes without incident. She was provided with Vaccine Information Sheet and instruction to access the V-Safe system.   [name] was instructed to call 911 with any severe reactions post vaccine: Marland Kitchen Difficulty breathing  . Swelling of face and throat  . A fast heartbeat  . A bad rash all over body  . Dizziness and weakness   Immunizations Administered    Name Date Dose VIS Date Route   Pfizer COVID-19 Vaccine 10/07/2019  9:57 AM 0.3 mL 06/28/2019 Intramuscular   Manufacturer: [employer], [employer]   Lot: [id number]   NDC: [address]-8

## 2019-11-08 ENCOUNTER — Other Ambulatory Visit: Payer: Self-pay | Admitting: Student

## 2019-11-08 DIAGNOSIS — K754 Autoimmune hepatitis: Secondary | ICD-10-CM

## 2019-11-08 DIAGNOSIS — K862 Cyst of pancreas: Secondary | ICD-10-CM

## 2019-11-26 ENCOUNTER — Other Ambulatory Visit: Payer: Self-pay | Admitting: Student

## 2019-11-26 ENCOUNTER — Ambulatory Visit
Admission: RE | Admit: 2019-11-26 | Discharge: 2019-11-26 | Disposition: A | Discharge status: Home / Self Care | Payer: Medicare HMO | Source: Ambulatory Visit | Attending: Student | Admitting: Student

## 2019-11-26 ENCOUNTER — Other Ambulatory Visit: Payer: Self-pay

## 2019-11-26 DIAGNOSIS — K754 Autoimmune hepatitis: Secondary | ICD-10-CM | POA: Insufficient documentation

## 2019-11-26 DIAGNOSIS — K862 Cyst of pancreas: Secondary | ICD-10-CM | POA: Insufficient documentation

## 2019-11-26 MED ORDER — GADOBUTROL 1 MMOL/ML IV SOLN
7.0000 mL | Freq: Once | INTRAVENOUS | Status: AC | PRN
  Administered 2019-11-26: 7 mL via INTRAVENOUS

## 2020-01-10 ENCOUNTER — Other Ambulatory Visit: Payer: Self-pay | Admitting: Internal Medicine

## 2020-01-10 DIAGNOSIS — Z1231 Encounter for screening mammogram for malignant neoplasm of breast: Secondary | ICD-10-CM

## 2020-02-14 ENCOUNTER — Ambulatory Visit
Admission: RE | Admit: 2020-02-14 | Discharge: 2020-02-14 | Disposition: A | Discharge status: Home / Self Care | Payer: Medicare HMO | Source: Ambulatory Visit | Attending: Internal Medicine | Admitting: Internal Medicine

## 2020-02-14 DIAGNOSIS — Z1231 Encounter for screening mammogram for malignant neoplasm of breast: Secondary | ICD-10-CM | POA: Diagnosis present

## 2020-05-22 IMAGING — MR MR ABDOMEN WO/W CM MRCP
18 of 21 series · 43 of 48 positions shown · IV contrast (gadavist)
Comparison: 04/08/2018

CLINICAL DATA: Follow-up pancreatic lesion, cystic lesion. History
of autoimmune hepatitis.

EXAM:
MRI ABDOMEN WITHOUT AND WITH CONTRAST (INCLUDING MRCP)
TECHNIQUE: Multiplanar multisequence MR imaging of the abdomen was performed
both before and after the administration of intravenous contrast.
Heavily T2-weighted images of the biliary and pancreatic ducts were
obtained, and three-dimensional MRCP images were rendered by post
processing.
CONTRAST:  7mL GADAVIST GADOBUTROL 1 MMOL/ML IV SOLN

[Series 3: T2 · coronal · 6.5mm · 1.19mm/px · 1 of 30 slices shown (1 of 2)]
[im 1/30]
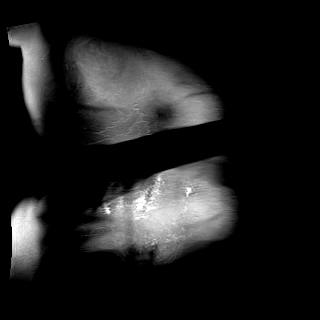

[Series 4: T2 · axial · 6.5mm · 1.19mm/px · 1 of 32 slices shown (2 of 2)]
[im 1/32]
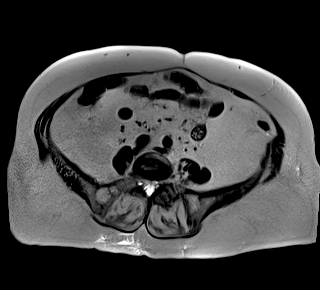

[Series 5: T1 · axial · 6.5mm · 0.74mm/px · z∈[-75,+167]mm · 3 of 64 slices shown]
[im 1/64]
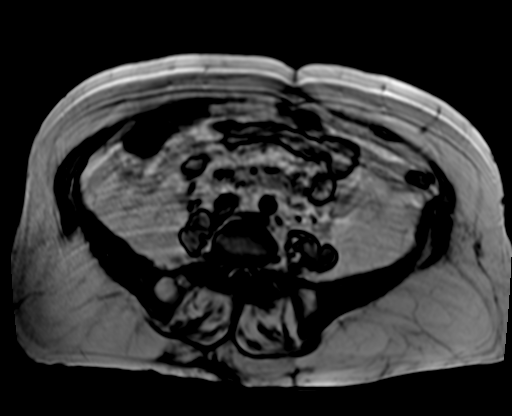
[im 32/64]
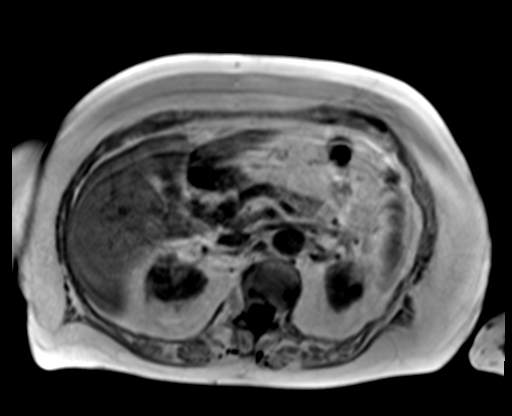
[im 64/64]
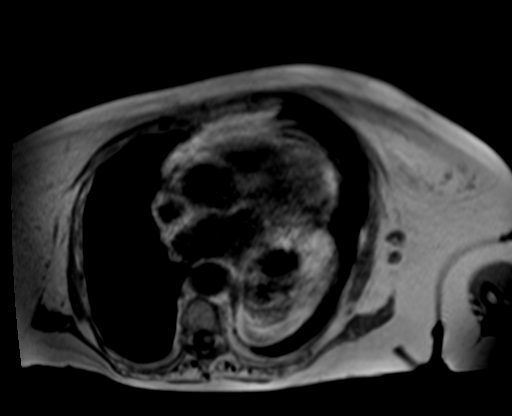

[Series 8: T2 fat-sat · axial · 6.5mm · 1.19mm/px · 1 of 32 slices shown]
[im 1/32]
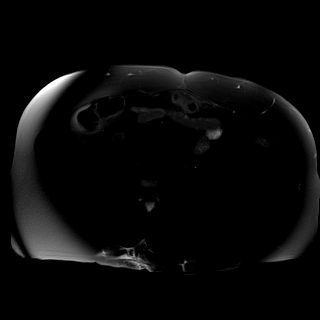

[Series 9: ax dwi_tracew · axial · 6.5mm · 1.42mm/px · z∈[-75,+167]mm · 4 of 96 slices shown]
[im 1/96]
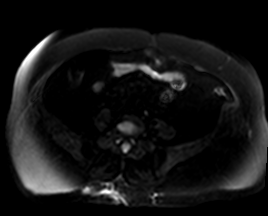
[im 32/96]
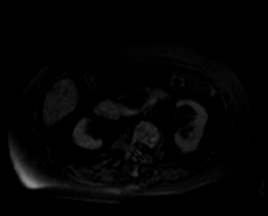
[im 64/96]
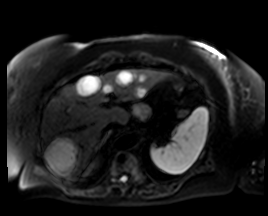
[im 96/96]
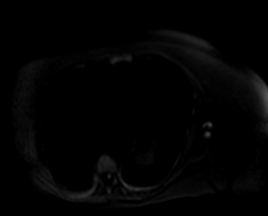

[Series 10: ax dwi_adc · axial · 6.5mm · 1.42mm/px · 1 of 32 slices shown]
[im 1/32]
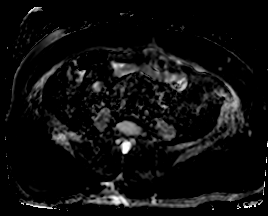

[Series 14: MRCP · coronal · 3.5mm · 1.12mm/px · 1 of 17 slices shown]
[im 1/17]
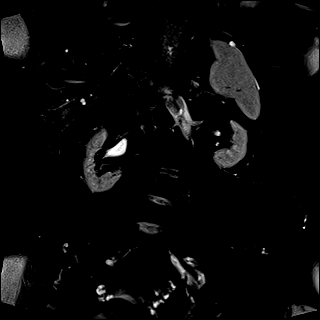

[Series 15: radials · oblique · 50.0mm · 0.78mm/px · 1 of 5 slices shown]
[im 1/5]
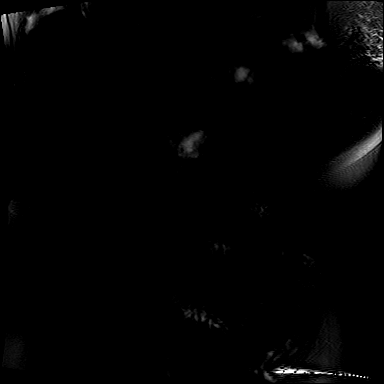

[Series 16: T1 dynamic fat-sat · axial · non-contrast · 3.5mm · 1.19mm/px · z∈[-78,+170]mm · 3 of 72 slices shown (1 of 5)]
[im 1/72]
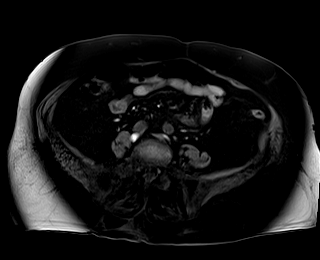
[im 36/72]
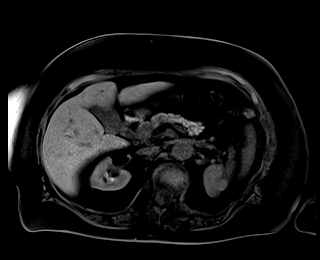
[im 72/72]
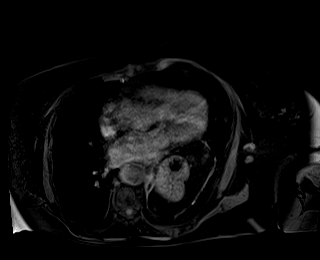

[Series 17: T1 dynamic fat-sat post-contrast · axial · 3.5mm · 1.19mm/px · z∈[-78,+170]mm · 3 of 72 slices shown (1 of 4)]
[im 1/72]
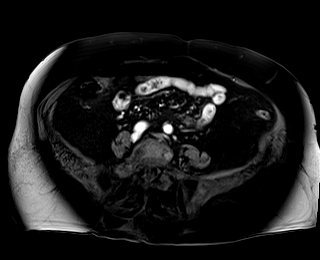
[im 36/72]
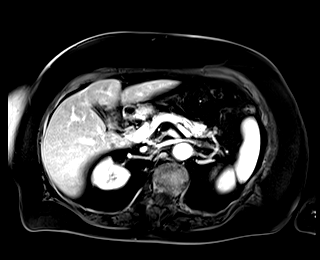
[im 72/72]
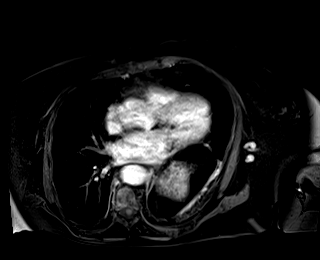

[Series 18: T1 dynamic fat-sat · axial · 3.5mm · 1.19mm/px · z∈[-78,+170]mm · 3 of 72 slices shown (2 of 5)]
[im 1/72]
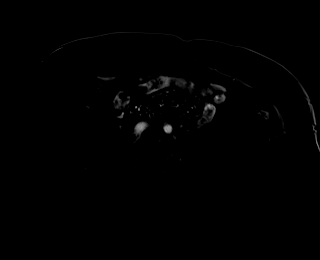
[im 36/72]
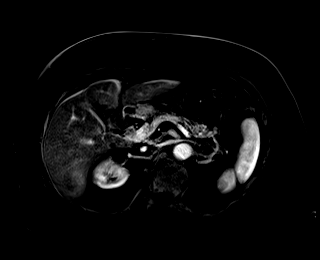
[im 72/72]
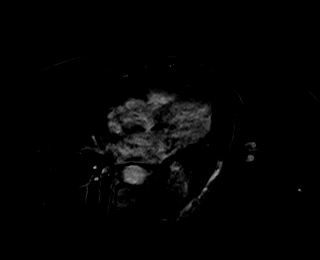

[Series 19: T1 dynamic fat-sat post-contrast · axial · 3.5mm · 1.19mm/px · z∈[-78,+170]mm · 3 of 72 slices shown (2 of 4)]
[im 1/72]
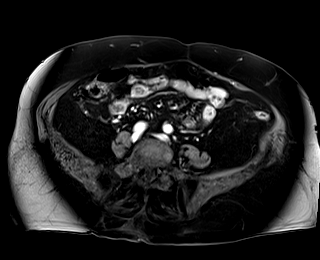
[im 36/72]
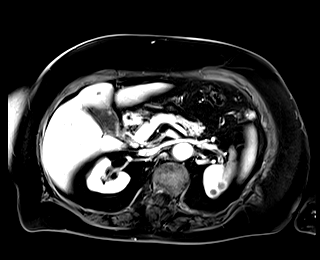
[im 72/72]
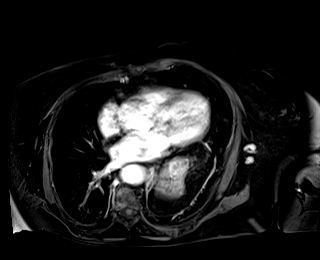

[Series 20: T1 dynamic fat-sat · axial · 3.5mm · 1.19mm/px · z∈[-78,+170]mm · 3 of 72 slices shown (3 of 5)]
[im 1/72]
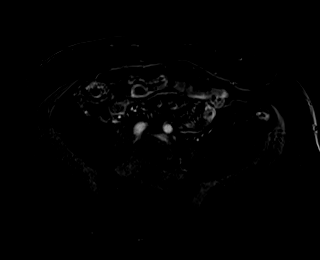
[im 36/72]
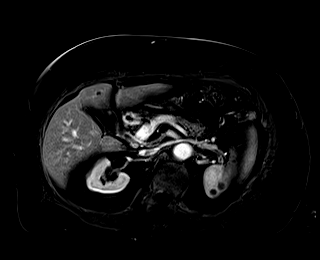
[im 72/72]
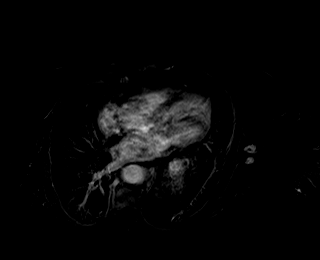

[Series 21: T1 dynamic fat-sat post-contrast · axial · 3.5mm · 1.19mm/px · z∈[-78,+170]mm · 3 of 72 slices shown (3 of 4)]
[im 1/72]
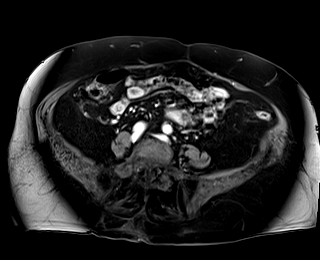
[im 36/72]
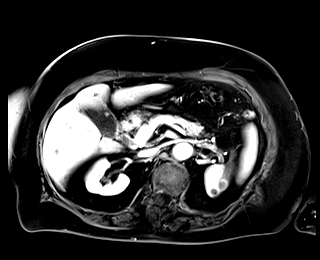
[im 72/72]
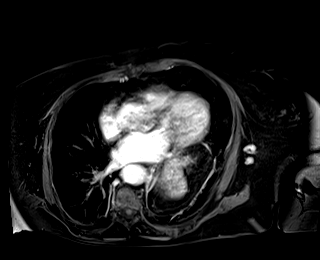

[Series 22: T1 dynamic fat-sat · axial · 3.5mm · 1.19mm/px · z∈[-78,+170]mm · 3 of 72 slices shown (4 of 5)]
[im 1/72]
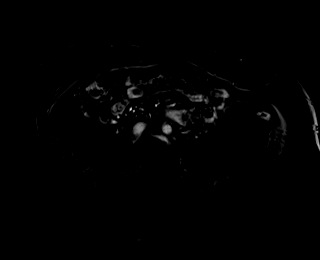
[im 36/72]
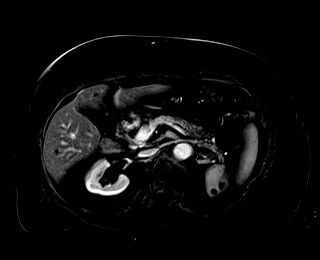
[im 72/72]
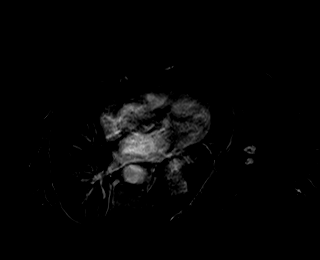

[Series 23: T1 dynamic post-contrast · coronal · 3.2mm · 1.31mm/px · 3 of 70 slices shown]
[im 1/70]
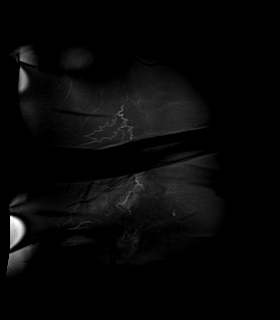
[im 35/70]
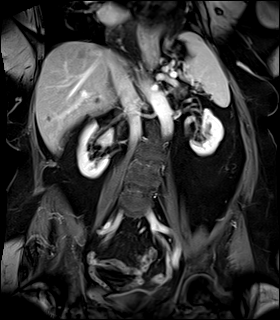
[im 70/70]
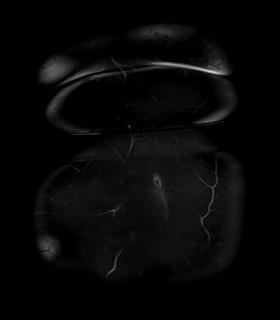

[Series 24: T1 dynamic fat-sat post-contrast · axial · 3.5mm · 1.19mm/px · z∈[-78,+170]mm · 3 of 72 slices shown (4 of 4)]
[im 1/72]
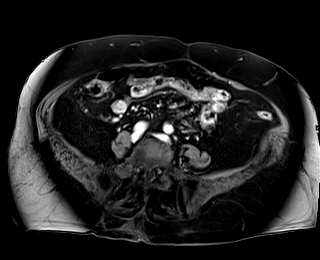
[im 36/72]
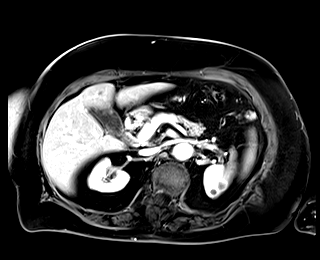
[im 72/72]
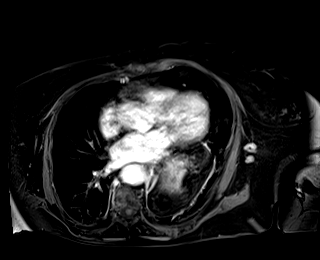

[Series 25: T1 dynamic fat-sat · axial · 3.5mm · 1.19mm/px · z∈[-78,+170]mm · 3 of 72 slices shown (5 of 5)]
[im 1/72]
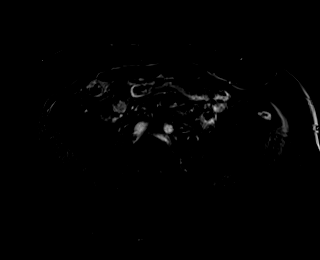
[im 36/72]
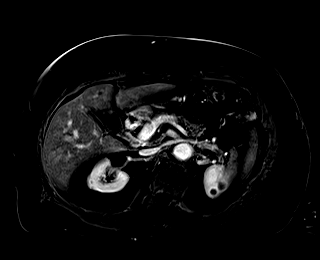
[im 72/72]
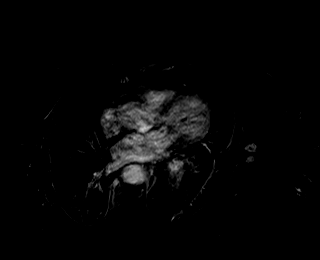

[43 of 48 positions shown; findings below may reference images not displayed]

FINDINGS: Lower chest: Incidental imaging of the lung bases is remarkable for
a moderately large hiatal hernia with herniation of much of the
stomach into the chest. No effusion or consolidation. Limited
assessment on MRI.

Hepatobiliary: Multiple hepatic cysts similar to prior examinations.
No biliary ductal dilation

Pancreas: 2.1 cm cystic pancreatic lesion posterior to the
pancreatic head and superior to the uncinate process. No significant
main duct dilation.

Cystic lesion in the tail of the pancreas is diminished in size
measuring approximately 8 mm as compared to 10 mm on the prior
study.

No suspicious enhancing features in these areas.

Spleen: Spleen is unremarkable, no focal lesion. Elevated LEFT
hemidiaphragm.

Adrenals/Urinary Tract: Adrenal glands are normal. Bilateral renal
cysts are unchanged. No hydronephrosis.

Stomach/Bowel: Moderately large hiatal hernia approximately [DATE] the
stomach extends into the chest similar to prior imaging.

Vascular/Lymphatic: Patent abdominal vasculature without aneurysmal
dilation of the abdominal aorta.

Other:  No ascites.

Musculoskeletal: Scoliotic curvature of the spine with dextroconvex
component in the lumbar spine with similar appearance to the prior
study. No significant, suspicious bone abnormality to the extent
visualized.
IMPRESSION: 1. Stable dominant cystic area in the retropancreatic region with no
further dilation of the main pancreatic duct within 1-2 mm of its
size in 8068 and with decrease in size of the smaller lesion in the
pancreatic tail consider reimaging in 12 months to assess for
stability given the greater than 2 years of stability that has
currently been demonstrated.
2. Stable moderately large hiatal hernia with herniation of much of
the stomach into the chest.
3. Stable hepatic and bilateral renal cysts.

## 2020-08-13 ENCOUNTER — Other Ambulatory Visit: Payer: Self-pay | Admitting: Gastroenterology

## 2020-08-13 DIAGNOSIS — K754 Autoimmune hepatitis: Secondary | ICD-10-CM

## 2020-08-13 DIAGNOSIS — K862 Cyst of pancreas: Secondary | ICD-10-CM

## 2020-12-02 ENCOUNTER — Other Ambulatory Visit: Payer: Self-pay

## 2020-12-02 ENCOUNTER — Ambulatory Visit
Admission: RE | Admit: 2020-12-02 | Discharge: 2020-12-02 | Disposition: A | Discharge status: Home / Self Care | Payer: Medicare HMO | Source: Ambulatory Visit | Attending: Gastroenterology | Admitting: Gastroenterology

## 2020-12-02 ENCOUNTER — Other Ambulatory Visit: Payer: Self-pay | Admitting: Gastroenterology

## 2020-12-02 DIAGNOSIS — K862 Cyst of pancreas: Secondary | ICD-10-CM | POA: Diagnosis present

## 2020-12-02 DIAGNOSIS — K754 Autoimmune hepatitis: Secondary | ICD-10-CM | POA: Diagnosis not present

## 2020-12-02 MED ORDER — GADOBUTROL 1 MMOL/ML IV SOLN
7.0000 mL | Freq: Once | INTRAVENOUS | Status: AC | PRN
  Administered 2020-12-02: 7 mL via INTRAVENOUS

## 2021-04-29 DIAGNOSIS — M810 Age-related osteoporosis without current pathological fracture: Secondary | ICD-10-CM | POA: Diagnosis not present

## 2021-05-05 DIAGNOSIS — M810 Age-related osteoporosis without current pathological fracture: Secondary | ICD-10-CM | POA: Diagnosis not present

## 2021-05-05 DIAGNOSIS — E213 Hyperparathyroidism, unspecified: Secondary | ICD-10-CM | POA: Diagnosis not present

## 2021-05-12 DIAGNOSIS — M810 Age-related osteoporosis without current pathological fracture: Secondary | ICD-10-CM | POA: Diagnosis not present

## 2021-05-12 DIAGNOSIS — N1831 Chronic kidney disease, stage 3a: Secondary | ICD-10-CM | POA: Diagnosis not present

## 2021-05-12 DIAGNOSIS — E213 Hyperparathyroidism, unspecified: Secondary | ICD-10-CM | POA: Diagnosis not present

## 2021-05-18 ENCOUNTER — Other Ambulatory Visit: Payer: Self-pay | Admitting: Gastroenterology

## 2021-05-18 DIAGNOSIS — D649 Anemia, unspecified: Secondary | ICD-10-CM | POA: Diagnosis not present

## 2021-05-18 DIAGNOSIS — K449 Diaphragmatic hernia without obstruction or gangrene: Secondary | ICD-10-CM

## 2021-05-18 DIAGNOSIS — K754 Autoimmune hepatitis: Secondary | ICD-10-CM | POA: Diagnosis not present

## 2021-05-18 DIAGNOSIS — K219 Gastro-esophageal reflux disease without esophagitis: Secondary | ICD-10-CM

## 2021-05-18 DIAGNOSIS — K862 Cyst of pancreas: Secondary | ICD-10-CM | POA: Diagnosis not present

## 2021-05-20 ENCOUNTER — Encounter: Payer: Self-pay | Admitting: *Deleted

## 2021-05-21 ENCOUNTER — Encounter
Admission: RE | Disposition: A | Discharge status: Home / Self Care | Payer: Self-pay | Source: Home / Self Care | Attending: Gastroenterology

## 2021-05-21 ENCOUNTER — Ambulatory Visit
Admission: RE | Admit: 2021-05-21 | Discharge: 2021-05-21 | Disposition: A | Discharge status: Home / Self Care | Payer: Medicare HMO | Attending: Gastroenterology | Admitting: Gastroenterology

## 2021-05-21 ENCOUNTER — Encounter: Payer: Self-pay | Admitting: *Deleted

## 2021-05-21 ENCOUNTER — Ambulatory Visit: Payer: Medicare HMO | Admitting: Anesthesiology

## 2021-05-21 DIAGNOSIS — K2951 Unspecified chronic gastritis with bleeding: Secondary | ICD-10-CM | POA: Diagnosis not present

## 2021-05-21 DIAGNOSIS — K219 Gastro-esophageal reflux disease without esophagitis: Secondary | ICD-10-CM | POA: Diagnosis not present

## 2021-05-21 DIAGNOSIS — K2950 Unspecified chronic gastritis without bleeding: Secondary | ICD-10-CM | POA: Diagnosis not present

## 2021-05-21 DIAGNOSIS — D509 Iron deficiency anemia, unspecified: Secondary | ICD-10-CM | POA: Insufficient documentation

## 2021-05-21 DIAGNOSIS — Q399 Congenital malformation of esophagus, unspecified: Secondary | ICD-10-CM | POA: Insufficient documentation

## 2021-05-21 DIAGNOSIS — K754 Autoimmune hepatitis: Secondary | ICD-10-CM | POA: Insufficient documentation

## 2021-05-21 DIAGNOSIS — D649 Anemia, unspecified: Secondary | ICD-10-CM | POA: Diagnosis not present

## 2021-05-21 DIAGNOSIS — K2970 Gastritis, unspecified, without bleeding: Secondary | ICD-10-CM | POA: Diagnosis not present

## 2021-05-21 DIAGNOSIS — K449 Diaphragmatic hernia without obstruction or gangrene: Secondary | ICD-10-CM | POA: Diagnosis not present

## 2021-05-21 HISTORY — DX: Gastro-esophageal reflux disease without esophagitis: K21.9

## 2021-05-21 HISTORY — PX: ESOPHAGOGASTRODUODENOSCOPY (EGD) WITH PROPOFOL: SHX5813

## 2021-05-21 SURGERY — ESOPHAGOGASTRODUODENOSCOPY (EGD) WITH PROPOFOL
Anesthesia: General

## 2021-05-21 MED ORDER — PROPOFOL 10 MG/ML IV BOLUS
INTRAVENOUS | Status: DC | PRN
  Administered 2021-05-21: 80 mg via INTRAVENOUS

## 2021-05-21 MED ORDER — SODIUM CHLORIDE 0.9 % IV SOLN: INTRAVENOUS | Status: DC

## 2021-05-21 MED ORDER — PROPOFOL 500 MG/50ML IV EMUL
INTRAVENOUS | Status: AC
  Filled 2021-05-21: qty 50

## 2021-05-21 MED ORDER — LIDOCAINE 2% (20 MG/ML) 5 ML SYRINGE
INTRAMUSCULAR | Status: DC | PRN
  Administered 2021-05-21: 100 mg via INTRAVENOUS

## 2021-05-21 MED ORDER — PROPOFOL 500 MG/50ML IV EMUL
INTRAVENOUS | Status: DC | PRN
  Administered 2021-05-21: 150 ug/kg/min via INTRAVENOUS

## 2021-05-21 MED ORDER — GLYCOPYRROLATE 0.2 MG/ML IJ SOLN
INTRAMUSCULAR | Status: DC | PRN
  Administered 2021-05-21: .2 mg via INTRAVENOUS

## 2021-05-21 MED ORDER — GLYCOPYRROLATE 0.2 MG/ML IJ SOLN
INTRAMUSCULAR | Status: AC
  Filled 2021-05-21: qty 1

## 2021-05-21 MED ORDER — LIDOCAINE HCL (PF) 2 % IJ SOLN
INTRAMUSCULAR | Status: AC
  Filled 2021-05-21: qty 5

## 2021-05-21 NOTE — H&P (Signed)
Outpatient short stay form Pre-procedure 05/21/2021  Regis Bill, MD  Primary Physician: Margaretann Loveless, MD  Reason for visit:  IDA  History of present illness:   75 y/o lady with AIH without cirrhosis here with IDA without overt GI bleeding. Has known large hiatal hernia. Colonoscopy done 2 years ago was normal. No family history of GI malignancies. No blood thinners.    Current Facility-Administered Medications:    0.9 %  sodium chloride infusion, , Intravenous, Continuous, Ester Hilley, Rossie Muskrat, MD, Last Rate: 20 mL/hr at 05/21/21 1103, Continued from Pre-op at 05/21/21 1103  Medications Prior to Admission  Medication Sig Dispense Refill Last Dose   amLODipine (NORVASC) 5 MG tablet Take 5 mg by mouth daily.  6 05/21/2021   aspirin EC 81 MG tablet Take 81 mg by mouth daily.    Past Month   azaTHIOprine (IMURAN) 50 MG tablet Take 50 mg by mouth daily.  0 05/20/2021   Calcium Carbonate Antacid 600 MG chewable tablet Chew 600 mg by mouth.   05/20/2021   calcium citrate-vitamin D (CITRACAL+D) 315-200 MG-UNIT tablet Take 1 tablet by mouth 2 (two) times daily.   05/20/2021   cyanocobalamin 1000 MCG tablet Take 1,000 mcg by mouth daily.   05/20/2021   estradiol (ESTRACE) 0.1 MG/GM vaginal cream Insert pea size amount daily x 2 weeks then every other day x 2 weeks then 2-3 times weekly for maintenance   05/20/2021   Ferrous Fumarate (HEMOCYTE - 106 MG FE) 324 (106 Fe) MG TABS tablet Take 1 tablet by mouth.   05/20/2021   metoprolol tartrate (LOPRESSOR) 50 MG tablet Take 50 mg by mouth daily.   05/21/2021   predniSONE (DELTASONE) 5 MG tablet Take 7.5 mg by mouth daily.  3 05/21/2021   alendronate (FOSAMAX) 70 MG tablet Take 70 mg by mouth once a week. Take with a full glass of water on an empty stomach. (Patient not taking: Reported on 05/21/2021)   Not Taking   benazepril (LOTENSIN) 40 MG tablet Take 40 mg by mouth daily.  (Patient not taking: Reported on 05/21/2021)   Not Taking   Misc. Devices  (WALKER) MISC 1 Device by Does not apply route as directed. 1 each 0      Allergies  Allergen Reactions   Sulfa Antibiotics Nausea And Vomiting    Pt has not had a sulfa drug in years and is not certain of reaction but suspects nausea and vomiting.   Sulfasalazine Nausea And Vomiting     Past Medical History:  Diagnosis Date   Abnormal LFTs (liver function tests)    Autoimmune hepatitis ([facility]) 2018   Autoimmune hepatitis ([facility])    Autoimmune hepatitis treated with steroids ([facility])    DOE (dyspnea on exertion)    GERD (gastroesophageal reflux disease)    Hepatitis    Hyperlipidemia    Hypertension    Osteoporosis     Review of systems:  Otherwise negative.    Physical Exam  Gen: Alert, oriented. Appears stated age.  HEENT: PERRLA. Lungs: No respiratory distress CV: RRR Abd: soft, benign, no masses Ext: No edema    Planned procedures: Proceed with EGD. The patient understands the nature of the planned procedure, indications, risks, alternatives and potential complications including but not limited to bleeding, infection, perforation, damage to internal organs and possible oversedation/side effects from anesthesia. The patient agrees and gives consent to proceed.  Please refer to procedure notes for findings, recommendations and patient disposition/instructions.  Lesly Rubenstein, MD [facility]

## 2021-05-21 NOTE — Anesthesia Postprocedure Evaluation (Signed)
Anesthesia Post Note  Patient: [name] [name]  Procedure(s) Performed: ESOPHAGOGASTRODUODENOSCOPY (EGD) WITH PROPOFOL  Patient location during evaluation: PACU Anesthesia Type: General Level of consciousness: awake and alert Pain management: pain level controlled Vital Signs Assessment: post-procedure vital signs reviewed and stable Respiratory status: spontaneous breathing, nonlabored ventilation, respiratory function stable and patient connected to nasal cannula oxygen Cardiovascular status: blood pressure returned to baseline and stable Postop Assessment: no apparent nausea or vomiting Anesthetic complications: no   No notable events documented.   Last Vitals:  Vitals:   05/21/21 1141 05/21/21 1151  BP: (!) 115/57 128/62  Pulse: 77 70  Resp: (!) 21 20  Temp:    SpO2: 97% 96%    Last Pain:  Vitals:   05/21/21 1151  TempSrc:   PainSc: 0-No pain                 Aurelio Brash Mayson Sterbenz

## 2021-05-21 NOTE — Transfer of Care (Signed)
Immediate Anesthesia Transfer of Care Note  Patient: [name] [name]  Procedure(s) Performed: ESOPHAGOGASTRODUODENOSCOPY (EGD) WITH PROPOFOL  Patient Location: Endoscopy Unit  Anesthesia Type:General  Level of Consciousness: sedated  Airway & Oxygen Therapy: Patient Spontanous Breathing  Post-op Assessment: Report given to RN and Post -op Vital signs reviewed and stable  Post vital signs: Reviewed and stable  Last Vitals:  Vitals Value Taken Time  BP 94/48 05/21/21 1131  Temp 36.4 C 05/21/21 1131  Pulse 68 05/21/21 1131  Resp 26 05/21/21 1131  SpO2 100 % 05/21/21 1131    Last Pain:  Vitals:   05/21/21 1131  TempSrc: Temporal  PainSc: Asleep         Complications: No notable events documented.

## 2021-05-21 NOTE — Interval H&P Note (Signed)
History and Physical Interval Note:  05/21/2021 11:08 AM  [name] [name]  has presented today for surgery, with the diagnosis of Gastritis, presence of bleeding unspecified, unspecified chronicity, unspecified gastritis type Anemia, unspecified type.  The various methods of treatment have been discussed with the patient and family. After consideration of risks, benefits and other options for treatment, the patient has consented to  Procedure(s): ESOPHAGOGASTRODUODENOSCOPY (EGD) WITH PROPOFOL (N/A) as a surgical intervention.  The patient's history has been reviewed, patient examined, no change in status, stable for surgery.  I have reviewed the patient's chart and labs.  Questions were answered to the patient's satisfaction.     Regis Bill  Ok to proceed with EGD

## 2021-05-21 NOTE — Op Note (Signed)
[facility]ology Patient Name: [name] [name] Procedure Date: 05/21/2021 11:02 AM MRN: [id number] Account #: [id number] Date of Birth: August 02, 1945 Admit Type: Outpatient Age: 75 Room: [facility] ENDO ROOM 3 Gender: Female Note Status: Finalized Instrument Name: Altamese Cabal Endoscope [id number] Procedure:             Upper GI endoscopy Indications:           Iron deficiency anemia Providers:             Andrey Farmer MD, MD Medicines:             Monitored Anesthesia Care Complications:         No immediate complications. Estimated blood loss:                         Minimal. Procedure:             Pre-Anesthesia Assessment:                        - Prior to the procedure, a History and Physical was                         performed, and patient medications and allergies were                         reviewed. The patient is competent. The risks and                         benefits of the procedure and the sedation options and                         risks were discussed with the patient. All questions                         were answered and informed consent was obtained.                         Patient identification and proposed procedure were                         verified by the physician, the nurse, the anesthetist                         and the technician in the endoscopy suite. Mental                         Status Examination: alert and oriented. Airway                         Examination: normal oropharyngeal airway and neck                         mobility. Respiratory Examination: clear to                         auscultation. CV Examination: normal. Prophylactic                         Antibiotics: The patient does not require prophylactic  antibiotics. Prior Anticoagulants: The patient has                         taken no previous anticoagulant or antiplatelet                         agents. ASA Grade Assessment: III - A  patient with                         severe systemic disease. After reviewing the risks and                         benefits, the patient was deemed in satisfactory                         condition to undergo the procedure. The anesthesia                         plan was to use monitored anesthesia care (MAC).                         Immediately prior to administration of medications,                         the patient was re-assessed for adequacy to receive                         sedatives. The heart rate, respiratory rate, oxygen                         saturations, blood pressure, adequacy of pulmonary                         ventilation, and response to care were monitored                         throughout the procedure. The physical status of the                         patient was re-assessed after the procedure.                        After obtaining informed consent, the endoscope was                         passed under direct vision. Throughout the procedure,                         the patient's blood pressure, pulse, and oxygen                         saturations were monitored continuously. The Endoscope                         was introduced through the mouth, and advanced to the                         second part of duodenum. The upper GI endoscopy was  accomplished without difficulty. The patient tolerated                         the procedure well. Findings:      The examined esophagus was mildly tortuous.      A 10 cm hiatal hernia was present. No Clee Pandit's erosions noted. Slight       twisting of the stomach due to size of hiatal hernia.      Patchy mild inflammation characterized by erythema was found in the       gastric antrum. Biopsies were taken with a cold forceps for Helicobacter       pylori testing. Estimated blood loss was minimal.      The examined duodenum was normal. Impression:            - Tortuous esophagus.                         - 10 cm hiatal hernia. No Tami Blass's erosions noted.                         Slight twisting of the stomach due to size of hiatal                         hernia.                        - Gastritis. Biopsied.                        - Normal examined duodenum. Recommendation:        - Discharge patient to home.                        - Resume previous diet.                        - Continue present medications.                        - Recommend an iron supplement and then recheck                         hemoglobin after 2-3 months. If IDA persistent will                         need to consider repeat colonoscopy and then possible                         VCE.                        - Return to referring physician as previously                         scheduled. Procedure Code(s):     --- Professional ---                        [id number], Esophagogastroduodenoscopy, flexible,                         transoral; with biopsy, single or multiple  Diagnosis Code(s):     --- Professional ---                        Q39.9, Congenital malformation of esophagus,                         unspecified                        K44.9, Diaphragmatic hernia without obstruction or                         gangrene                        K29.70, Gastritis, unspecified, without bleeding                        D50.9, Iron deficiency anemia, unspecified CPT copyright 2019 American Medical Association. All rights reserved. The codes documented in this report are preliminary and upon coder review may  be revised to meet current compliance requirements. Andrey Farmer MD, MD 05/21/2021 11:34:47 AM Number of Addenda: 0 Note Initiated On: 05/21/2021 11:02 AM Estimated Blood Loss:  Estimated blood loss was minimal.      [facility]

## 2021-05-21 NOTE — Anesthesia Preprocedure Evaluation (Signed)
Anesthesia Evaluation  Patient identified by MRN, date of birth, ID band Patient awake    Reviewed: Allergy & Precautions, H&P , NPO status , Patient's Chart, lab work & pertinent test results  History of Anesthesia Complications Negative for: history of anesthetic complications  Airway Mallampati: II  TM Distance: >3 FB Neck ROM: full    Dental   Pulmonary neg pulmonary ROS, neg sleep apnea, neg COPD,    Pulmonary exam normal        Cardiovascular hypertension, (-) angina+ DOE  (-) Past MI and (-) Cardiac Stents Normal cardiovascular exam(-) dysrhythmias      Neuro/Psych negative neurological ROS  negative psych ROS   GI/Hepatic GERD  Controlled,(+) Hepatitis - (autoimmune)  Endo/Other  negative endocrine ROS  Renal/GU negative Renal ROS  negative genitourinary   Musculoskeletal   Abdominal   Peds  Hematology negative hematology ROS (+)   Anesthesia Other Findings Past Medical History: No date: Abnormal LFTs (liver function tests) 2018: Autoimmune hepatitis (HCC) No date: Autoimmune hepatitis (HCC) No date: Autoimmune hepatitis treated with steroids (HCC) No date: DOE (dyspnea on exertion) No date: GERD (gastroesophageal reflux disease) No date: Hepatitis No date: Hyperlipidemia No date: Hypertension No date: Osteoporosis  Past Surgical History: No date: ABDOMINAL HYSTERECTOMY 10/06/2015: COLONOSCOPY 08/27/2018: COLONOSCOPY WITH PROPOFOL; N/A     Comment:  Procedure: COLONOSCOPY WITH PROPOFOL;  Surgeon: Scot Jun, MD;  Location: [facility] ENDOSCOPY;  Service:               Endoscopy;  Laterality: N/A; 08/27/2018: ESOPHAGOGASTRODUODENOSCOPY (EGD) WITH PROPOFOL; N/A     Comment:  Procedure: ESOPHAGOGASTRODUODENOSCOPY (EGD) WITH               PROPOFOL;  Surgeon: Scot Jun, MD;  Location:               [facility] ENDOSCOPY;  Service: Endoscopy;  Laterality: N/A; No date: LIVER BIOPSY  BMI     Body Mass Index: 24.09 kg/m      Reproductive/Obstetrics negative OB ROS                             Anesthesia Physical Anesthesia Plan  ASA: 2  Anesthesia Plan: General   Post-op Pain Management:    Induction:   PONV Risk Score and Plan: Propofol infusion and TIVA  Airway Management Planned: Nasal Cannula  Additional Equipment:   Intra-op Plan:   Post-operative Plan:   Informed Consent: I have reviewed the patients History and Physical, chart, labs and discussed the procedure including the risks, benefits and alternatives for the proposed anesthesia with the patient or authorized representative who has indicated his/her understanding and acceptance.     Dental Advisory Given  Plan Discussed with: Anesthesiologist, CRNA and Surgeon  Anesthesia Plan Comments:         Anesthesia Quick Evaluation

## 2021-05-22 NOTE — Progress Notes (Signed)
Non-identifying Voicemail.  No message Left. 

## 2021-05-24 ENCOUNTER — Encounter: Payer: Self-pay | Admitting: Gastroenterology

## 2021-05-24 LAB — SURGICAL PATHOLOGY

## 2021-05-27 ENCOUNTER — Encounter: Payer: Self-pay | Admitting: Oncology

## 2021-05-27 ENCOUNTER — Inpatient Hospital Stay: Payer: Medicare HMO | Attending: Oncology | Admitting: Oncology

## 2021-05-27 ENCOUNTER — Inpatient Hospital Stay: Payer: Medicare HMO

## 2021-05-27 ENCOUNTER — Other Ambulatory Visit: Payer: Self-pay

## 2021-05-27 ENCOUNTER — Other Ambulatory Visit: Payer: Self-pay | Admitting: *Deleted

## 2021-05-27 ENCOUNTER — Encounter (INDEPENDENT_AMBULATORY_CARE_PROVIDER_SITE_OTHER): Payer: Self-pay

## 2021-05-27 VITALS — BP 116/65 | HR 79 | Temp 97.8°F | Resp 18 | Wt 134.0 lb

## 2021-05-27 DIAGNOSIS — Z7982 Long term (current) use of aspirin: Secondary | ICD-10-CM | POA: Diagnosis not present

## 2021-05-27 DIAGNOSIS — D500 Iron deficiency anemia secondary to blood loss (chronic): Secondary | ICD-10-CM

## 2021-05-27 DIAGNOSIS — D509 Iron deficiency anemia, unspecified: Secondary | ICD-10-CM | POA: Diagnosis not present

## 2021-05-27 DIAGNOSIS — N1831 Chronic kidney disease, stage 3a: Secondary | ICD-10-CM | POA: Insufficient documentation

## 2021-05-27 DIAGNOSIS — K754 Autoimmune hepatitis: Secondary | ICD-10-CM | POA: Insufficient documentation

## 2021-05-27 DIAGNOSIS — Z79899 Other long term (current) drug therapy: Secondary | ICD-10-CM | POA: Diagnosis not present

## 2021-05-27 HISTORY — DX: Iron deficiency anemia secondary to blood loss (chronic): D50.0

## 2021-05-27 NOTE — Addendum Note (Signed)
Addended by: Rickard Patience on: 05/27/2021 09:19 PM   Modules accepted: Orders

## 2021-05-27 NOTE — Progress Notes (Signed)
Hematology/Oncology Consult note [phone] [phone]      Patient Care Team: Margaretann Loveless, MD as PCP - General (Internal Medicine)  REFERRING PROVIDER: Freda Munro*  CHIEF COMPLAINTS/REASON FOR VISIT:  Evaluation of anemia  HISTORY OF PRESENTING ILLNESS:   [name] [name] is a  75 y.o.  female with PMH listed below was seen in consultation at the request of  Freda Munro*  for evaluation of anemia  Patient has chronic anemia.  I reviewed patient's previous medical records, anemia can be dated back to August 2019. Usually her baseline is 10-11. 05/18/2021, patient has had blood work done at [facility]. CBC showed a hemoglobin of 7.3, MCV 72.5, normal WBC and platelet count CMP showed creatinine 1.1 with a GFR of 48., AST and ALT is not elevated. Differential showed lymphocytopenia with an absolute lymphocyte of 0.57, normal neutrophil count at 5.85, increased neutrophil percentage 80.6% and decreased lymphocyte percentage to 7.9%. Iron panel showed ferritin of 6, iron saturation 7, Patient follows up with gastroenterology for autoimmune hepatitis.  Patient is on azathioprine 25 mg daily and prednisone 5 mg daily.  Patient has chronic mild pruritus.  12/02/2020 MRI abdomen MRCP with and without contrast pancreatic cysts  05/21/2021, upper endoscopy showed gastritis.  Pathology confirmed chronic gastritis, negative for active inflammation and H. pylori, negative for intestinal metaplasia, dysplasia and malignancy. 08/27/2018, colonoscopy showed diverticulosis in the sigmoid colon.  Internal hemorrhoids.  Patient takes oral iron supplementation once daily.  Tolerates well.  + Fatigue  Review of Systems  Constitutional:  Positive for fatigue. Negative for appetite change, chills and fever.  HENT:   Negative for hearing loss and voice change.   Eyes:  Negative for eye problems.  Respiratory:  Negative for chest tightness and  cough.   Cardiovascular:  Negative for chest pain.  Gastrointestinal:  Negative for abdominal distention, abdominal pain and blood in stool.  Endocrine: Negative for hot flashes.  Genitourinary:  Negative for difficulty urinating and frequency.   Musculoskeletal:  Negative for arthralgias.  Skin:  Positive for itching. Negative for rash.  Neurological:  Negative for extremity weakness.  Hematological:  Negative for adenopathy.  Psychiatric/Behavioral:  Negative for confusion.    MEDICAL HISTORY:  Past Medical History:  Diagnosis Date   Abnormal LFTs (liver function tests)    Autoimmune hepatitis (HCC) 2018   Autoimmune hepatitis (HCC)    Autoimmune hepatitis treated with steroids (HCC)    DOE (dyspnea on exertion)    GERD (gastroesophageal reflux disease)    Hepatitis    Hyperlipidemia    Hypertension    Osteoporosis     SURGICAL HISTORY: Past Surgical History:  Procedure Laterality Date   ABDOMINAL HYSTERECTOMY     COLONOSCOPY  10/06/2015   COLONOSCOPY WITH PROPOFOL N/A 08/27/2018   Procedure: COLONOSCOPY WITH PROPOFOL;  Surgeon: Scot Jun, MD;  Location: [facility] ENDOSCOPY;  Service: Endoscopy;  Laterality: N/A;   ESOPHAGOGASTRODUODENOSCOPY (EGD) WITH PROPOFOL N/A 08/27/2018   Procedure: ESOPHAGOGASTRODUODENOSCOPY (EGD) WITH PROPOFOL;  Surgeon: Scot Jun, MD;  Location: [facility] ENDOSCOPY;  Service: Endoscopy;  Laterality: N/A;   ESOPHAGOGASTRODUODENOSCOPY (EGD) WITH PROPOFOL N/A 05/21/2021   Procedure: ESOPHAGOGASTRODUODENOSCOPY (EGD) WITH PROPOFOL;  Surgeon: Regis Bill, MD;  Location: [facility] ENDOSCOPY;  Service: Endoscopy;  Laterality: N/A;   LIVER BIOPSY      SOCIAL HISTORY: Social History   Socioeconomic History   Marital status: Married    Spouse name: Not on file   Number of children:  Not on file   Years of education: Not on file   Highest education level: Not on file  Occupational History   Not on file  Tobacco Use   Smoking status: Never    Smokeless tobacco: Never  Vaping Use   Vaping Use: Never used  Substance and Sexual Activity   Alcohol use: No   Drug use: Never   Sexual activity: Not Currently    Birth control/protection: Post-menopausal  Other Topics Concern   Not on file  Social History Narrative   Not on file   Social Determinants of Health   Financial Resource Strain: Not on file  Food Insecurity: Not on file  Transportation Needs: Not on file  Physical Activity: Not on file  Stress: Not on file  Social Connections: Not on file  Intimate Partner Violence: Not on file    FAMILY HISTORY: Family History  Problem Relation Age of Onset   Cancer Mother     ALLERGIES:  is allergic to sulfa antibiotics and sulfasalazine.  MEDICATIONS:  Current Outpatient Medications  Medication Sig Dispense Refill   amLODipine (NORVASC) 5 MG tablet Take 5 mg by mouth daily.  6   aspirin EC 81 MG tablet Take 81 mg by mouth daily.      azaTHIOprine (IMURAN) 50 MG tablet Take 50 mg by mouth daily.  0   Calcium Carbonate Antacid 600 MG chewable tablet Chew 600 mg by mouth.     calcium citrate-vitamin D (CITRACAL+D) 315-200 MG-UNIT tablet Take 1 tablet by mouth 2 (two) times daily.     Cholecalciferol 1.25 MG (50000 UT) capsule once a week.     cyanocobalamin 100 MCG tablet Take by mouth.     cyanocobalamin 1000 MCG tablet Take 1,000 mcg by mouth daily.     [START ON 10/11/2021] denosumab (PROLIA) 60 MG/ML SOSY injection Inject into the skin.     estradiol (ESTRACE) 0.1 MG/GM vaginal cream Insert pea size amount daily x 2 weeks then every other day x 2 weeks then 2-3 times weekly for maintenance     Ferrous Fumarate (HEMOCYTE - 106 MG FE) 324 (106 Fe) MG TABS tablet Take 1 tablet by mouth.     Ferrous Fumarate (HEMOCYTE - 106 MG FE) 324 (106 Fe) MG TABS tablet Take by mouth.     metoprolol tartrate (LOPRESSOR) 50 MG tablet Take 50 mg by mouth daily.     Misc. Devices (WALKER) MISC 1 Device by Does not apply route as directed.  1 each 0   omeprazole (PRILOSEC) 40 MG capsule Take by mouth.     potassium chloride (KLOR-CON) 10 MEQ tablet Take by mouth.     predniSONE (DELTASONE) 5 MG tablet Take 7.5 mg by mouth daily.  3   PROLIA 60 MG/ML SOSY injection Inject into the skin.     alendronate (FOSAMAX) 70 MG tablet Take 70 mg by mouth once a week. Take with a full glass of water on an empty stomach. (Patient not taking: No sig reported)     benazepril (LOTENSIN) 40 MG tablet Take 40 mg by mouth daily.  (Patient not taking: No sig reported)     No current facility-administered medications for this visit.     PHYSICAL EXAMINATION: ECOG PERFORMANCE STATUS: 1 - Symptomatic but completely ambulatory Vitals:   05/27/21 1115  BP: 116/65  Pulse: 79  Resp: 18  Temp: 97.8 F (36.6 C)  SpO2: 98%   Filed Weights   05/27/21 1115  Weight: 134 lb (60.8  kg)    Physical Exam Constitutional:      General: She is not in acute distress. HENT:     Head: Normocephalic and atraumatic.  Eyes:     General: No scleral icterus. Cardiovascular:     Rate and Rhythm: Normal rate and regular rhythm.     Heart sounds: Normal heart sounds.  Pulmonary:     Effort: Pulmonary effort is normal. No respiratory distress.     Breath sounds: No wheezing.  Abdominal:     General: Bowel sounds are normal. There is no distension.     Palpations: Abdomen is soft.  Musculoskeletal:        General: No deformity. Normal range of motion.     Cervical back: Normal range of motion and neck supple.  Skin:    General: Skin is warm and dry.     Findings: No erythema or rash.  Neurological:     Mental Status: She is alert and oriented to person, place, and time. Mental status is at baseline.     Cranial Nerves: No cranial nerve deficit.     Coordination: Coordination normal.  Psychiatric:        Mood and Affect: Mood normal.    LABORATORY DATA:  I have reviewed the data as listed Lab Results  Component Value Date   WBC 8.9 05/02/2018    HGB 11.4 (L) 05/02/2018   HCT 38.3 05/02/2018   MCV 87.2 05/02/2018   PLT 182 05/02/2018   No results for input(s): NA, K, CL, CO2, GLUCOSE, BUN, CREATININE, CALCIUM, GFRNONAA, GFRAA, PROT, ALBUMIN, AST, ALT, ALKPHOS, BILITOT, BILIDIR, IBILI in the last 8760 hours. Iron/TIBC/Ferritin/ %Sat No results found for: IRON, TIBC, FERRITIN, IRONPCTSAT    RADIOGRAPHIC STUDIES: I have personally reviewed the radiological images as listed and agreed with the findings in the report. No results found.    ASSESSMENT & PLAN:  1. Iron deficiency anemia due to chronic blood loss   2. Stage 3a chronic kidney disease ([place])   3. Autoimmune hepatitis ([place])    Iron deficiency anemia Her blood work done recently at [facility] was reviewed by me and discussed with patient. She has microcytic anemia with a hemoglobin of 7.3, iron panel showed low ferritin.   She has iron deficient anemia despite taking oral iron supplementation. Discussed with the patient about IV Venofer treatment option. Plan IV iron with Venofer 200mg  weekly x 4 doses. Allergy reactions/infusion reaction including anaphylactic reaction were discussed with patient. Other side effects include but not limited to high blood pressure, skin rash, weight gain, leg swelling, etc. Patient voices understanding and willing to proceed.   Autoimmune hepatitis, on azathioprine and prednisone.  Azathioprine may contribute to anemia due to bone marrow suppression side effects.  Chronic kidney disease, stage III.  I will check SPEP at the next visit.  Orders Placed This Encounter  Procedures   Iron and TIBC    Standing Status:   Future    Standing Expiration Date:   05/27/2022   Folate    Standing Status:   Future    Standing Expiration Date:   05/27/2022   CBC with Differential/Platelet    Standing Status:   Future    Standing Expiration Date:   05/27/2022   Comprehensive metabolic panel    Standing Status:   Future    Standing  Expiration Date:   05/27/2022   Protein electrophoresis, serum    Standing Status:   Future    Standing Expiration  Date:   05/27/2022   Retic Panel    Standing Status:   Future    Standing Expiration Date:   05/27/2022     All questions were answered. The patient knows to call the clinic with any problems questions or concerns.   [place], Cherlynn Kaiser*    Return of visit: 2 months for reevaluation. Thank you for this kind referral and the opportunity to participate in the care of this patient. A copy of today's note is routed to referring provider   Earlie Server, MD, PhD [facility] Hematology Oncology 05/27/2021

## 2021-05-28 ENCOUNTER — Ambulatory Visit
Admission: RE | Admit: 2021-05-28 | Discharge: 2021-05-28 | Disposition: A | Discharge status: Home / Self Care | Payer: Medicare HMO | Source: Ambulatory Visit | Attending: Gastroenterology | Admitting: Gastroenterology

## 2021-05-28 DIAGNOSIS — K224 Dyskinesia of esophagus: Secondary | ICD-10-CM | POA: Diagnosis not present

## 2021-05-28 DIAGNOSIS — K449 Diaphragmatic hernia without obstruction or gangrene: Secondary | ICD-10-CM | POA: Insufficient documentation

## 2021-05-28 DIAGNOSIS — K219 Gastro-esophageal reflux disease without esophagitis: Secondary | ICD-10-CM | POA: Diagnosis not present

## 2021-05-31 ENCOUNTER — Other Ambulatory Visit: Payer: Self-pay

## 2021-05-31 ENCOUNTER — Inpatient Hospital Stay: Payer: Medicare HMO

## 2021-05-31 VITALS — BP 134/59 | HR 75 | Temp 98.2°F | Resp 18

## 2021-05-31 DIAGNOSIS — N1831 Chronic kidney disease, stage 3a: Secondary | ICD-10-CM | POA: Diagnosis not present

## 2021-05-31 DIAGNOSIS — Z79899 Other long term (current) drug therapy: Secondary | ICD-10-CM | POA: Diagnosis not present

## 2021-05-31 DIAGNOSIS — Z7982 Long term (current) use of aspirin: Secondary | ICD-10-CM | POA: Diagnosis not present

## 2021-05-31 DIAGNOSIS — K754 Autoimmune hepatitis: Secondary | ICD-10-CM | POA: Diagnosis not present

## 2021-05-31 DIAGNOSIS — D500 Iron deficiency anemia secondary to blood loss (chronic): Secondary | ICD-10-CM

## 2021-05-31 DIAGNOSIS — D509 Iron deficiency anemia, unspecified: Secondary | ICD-10-CM | POA: Diagnosis not present

## 2021-05-31 MED ORDER — SODIUM CHLORIDE 0.9 % IV SOLN: 200.0000 mg | Freq: Once | INTRAVENOUS | Status: DC

## 2021-05-31 MED ORDER — IRON SUCROSE 20 MG/ML IV SOLN
200.0000 mg | Freq: Once | INTRAVENOUS | Status: AC
  Administered 2021-05-31: 200 mg via INTRAVENOUS
  Filled 2021-05-31: qty 10

## 2021-05-31 MED ORDER — SODIUM CHLORIDE 0.9 % IV SOLN
Freq: Once | INTRAVENOUS | Status: AC
  Filled 2021-05-31: qty 250

## 2021-05-31 NOTE — Patient Instructions (Signed)
[facility] [place] [facility]   Discharge Instructions: Thank you for choosing [facility] to provide your oncology and hematology care.  If you have a lab appointment with the Cancer Center, please go directly to the Cancer Center and check in at the registration area.  We strive to give you quality time with your provider. You may need to reschedule your appointment if you arrive late (15 or more minutes).  Arriving late affects you and other patients whose appointments are after yours.  Also, if you miss three or more appointments without notifying the office, you may be dismissed from the clinic at the provider's discretion.      For prescription refill requests, have your pharmacy contact our office and allow 72 hours for refills to be completed.    Today you received the following: Venofer.      BELOW ARE SYMPTOMS THAT SHOULD BE REPORTED IMMEDIATELY: *FEVER GREATER THAN 100.4 F (38 C) OR HIGHER *CHILLS OR SWEATING *NAUSEA AND VOMITING THAT IS NOT CONTROLLED WITH YOUR NAUSEA MEDICATION *UNUSUAL SHORTNESS OF BREATH *UNUSUAL BRUISING OR BLEEDING *URINARY PROBLEMS (pain or burning when urinating, or frequent urination) *BOWEL PROBLEMS (unusual diarrhea, constipation, pain near the anus) TENDERNESS IN MOUTH AND THROAT WITH OR WITHOUT PRESENCE OF ULCERS (sore throat, sores in mouth, or a toothache) UNUSUAL RASH, SWELLING OR PAIN  UNUSUAL VAGINAL DISCHARGE OR ITCHING   Items with * indicate a potential emergency and should be followed up as soon as possible or go to the Emergency Department if any problems should occur.  Should you have questions after your visit or need to cancel or reschedule your appointment, please contact [facility] [place] [facility]  [phone] and follow the prompts.  Office hours are 8:00 a.m. to 4:30 p.m. Monday - Friday. Please note that voicemails left after 4:00 p.m. may not be returned until the following  business day.  We are closed weekends and major holidays. You have access to a nurse at all times for urgent questions. Please call the main number to the clinic [phone] and follow the prompts.  For any non-urgent questions, you may also contact your provider using MyChart. We now offer e-Visits for anyone 18 and older to request care online for non-urgent symptoms. For details visit [email or web address].   Also download the MyChart app! Go to the app store, search "MyChart", open the app, select [facility], and log in with your MyChart username and password.  Due to Covid, a mask is required upon entering the hospital/clinic. If you do not have a mask, one will be given to you upon arrival. For doctor visits, patients may have 1 support person aged 18 or older with them. For treatment visits, patients cannot have anyone with them due to current Covid guidelines and our immunocompromised population.  

## 2021-06-03 ENCOUNTER — Inpatient Hospital Stay: Payer: Medicare HMO

## 2021-06-03 ENCOUNTER — Other Ambulatory Visit: Payer: Self-pay

## 2021-06-03 VITALS — BP 125/66 | HR 67 | Temp 97.3°F | Resp 18

## 2021-06-03 DIAGNOSIS — N1831 Chronic kidney disease, stage 3a: Secondary | ICD-10-CM | POA: Diagnosis not present

## 2021-06-03 DIAGNOSIS — D500 Iron deficiency anemia secondary to blood loss (chronic): Secondary | ICD-10-CM

## 2021-06-03 DIAGNOSIS — Z7982 Long term (current) use of aspirin: Secondary | ICD-10-CM | POA: Diagnosis not present

## 2021-06-03 DIAGNOSIS — D509 Iron deficiency anemia, unspecified: Secondary | ICD-10-CM | POA: Diagnosis not present

## 2021-06-03 DIAGNOSIS — Z79899 Other long term (current) drug therapy: Secondary | ICD-10-CM | POA: Diagnosis not present

## 2021-06-03 DIAGNOSIS — K754 Autoimmune hepatitis: Secondary | ICD-10-CM | POA: Diagnosis not present

## 2021-06-03 MED ORDER — SODIUM CHLORIDE 0.9 % IV SOLN
Freq: Once | INTRAVENOUS | Status: AC
  Filled 2021-06-03: qty 250

## 2021-06-03 MED ORDER — SODIUM CHLORIDE 0.9 % IV SOLN: 200.0000 mg | Freq: Once | INTRAVENOUS | Status: DC

## 2021-06-03 MED ORDER — IRON SUCROSE 20 MG/ML IV SOLN
200.0000 mg | Freq: Once | INTRAVENOUS | Status: AC
  Administered 2021-06-03: 14:00:00 200 mg via INTRAVENOUS
  Filled 2021-06-03: qty 10

## 2021-06-03 NOTE — Patient Instructions (Signed)
[facility] [place] [facility]  Discharge Instructions: Thank you for choosing [facility] to provide your oncology and hematology care.  If you have a lab appointment with the Cancer Center, please go directly to the Cancer Center and check in at the registration area.  Wear comfortable clothing and clothing appropriate for easy access to any Portacath or PICC line.   We strive to give you quality time with your provider. You may need to reschedule your appointment if you arrive late (15 or more minutes).  Arriving late affects you and other patients whose appointments are after yours.  Also, if you miss three or more appointments without notifying the office, you may be dismissed from the clinic at the provider's discretion.      For prescription refill requests, have your pharmacy contact our office and allow 72 hours for refills to be completed.    Today you received the following chemotherapy and/or immunotherapy agents VENOFER      To help prevent nausea and vomiting after your treatment, we encourage you to take your nausea medication as directed.  BELOW ARE SYMPTOMS THAT SHOULD BE REPORTED IMMEDIATELY: *FEVER GREATER THAN 100.4 F (38 C) OR HIGHER *CHILLS OR SWEATING *NAUSEA AND VOMITING THAT IS NOT CONTROLLED WITH YOUR NAUSEA MEDICATION *UNUSUAL SHORTNESS OF BREATH *UNUSUAL BRUISING OR BLEEDING *URINARY PROBLEMS (pain or burning when urinating, or frequent urination) *BOWEL PROBLEMS (unusual diarrhea, constipation, pain near the anus) TENDERNESS IN MOUTH AND THROAT WITH OR WITHOUT PRESENCE OF ULCERS (sore throat, sores in mouth, or a toothache) UNUSUAL RASH, SWELLING OR PAIN  UNUSUAL VAGINAL DISCHARGE OR ITCHING   Items with * indicate a potential emergency and should be followed up as soon as possible or go to the Emergency Department if any problems should occur.  Please show the CHEMOTHERAPY ALERT CARD or IMMUNOTHERAPY ALERT CARD at check-in to  the Emergency Department and triage nurse.  Should you have questions after your visit or need to cancel or reschedule your appointment, please contact [facility] [place] [facility]  [phone] and follow the prompts.  Office hours are 8:00 a.m. to 4:30 p.m. Monday - Friday. Please note that voicemails left after 4:00 p.m. may not be returned until the following business day.  We are closed weekends and major holidays. You have access to a nurse at all times for urgent questions. Please call the main number to the clinic [phone] and follow the prompts.  For any non-urgent questions, you may also contact your provider using MyChart. We now offer e-Visits for anyone 18 and older to request care online for non-urgent symptoms. For details visit [email or web address].   Also download the MyChart app! Go to the app store, search "MyChart", open the app, select [facility], and log in with your MyChart username and password.  Due to Covid, a mask is required upon entering the hospital/clinic. If you do not have a mask, one will be given to you upon arrival. For doctor visits, patients may have 1 support person aged 18 or older with them. For treatment visits, patients cannot have anyone with them due to current Covid guidelines and our immunocompromised population.   Iron Sucrose Injection What is this medication? IRON SUCROSE (EYE ern SOO krose) treats low levels of iron (iron deficiency anemia) in people with kidney disease. Iron is a mineral that plays an important role in making red blood cells, which carry oxygen from your lungs to the rest of your body. This medicine may   be used for other purposes; ask your health care provider or pharmacist if you have questions. COMMON BRAND NAME(S): Venofer What should I tell my care team before I take this medication? They need to know if you have any of these conditions: Anemia not caused by low iron levels Heart disease High levels  of iron in the blood Kidney disease Liver disease An unusual or allergic reaction to iron, other medications, foods, dyes, or preservatives Pregnant or trying to get pregnant Breast-feeding How should I use this medication? This medication is for infusion into a vein. It is given in a hospital or clinic setting. Talk to your care team about the use of this medication in children. While this medication may be prescribed for children as young as 2 years for selected conditions, precautions do apply. Overdosage: If you think you have taken too much of this medicine contact a poison control center or emergency room at once. NOTE: This medicine is only for you. Do not share this medicine with others. What if I miss a dose? It is important not to miss your dose. Call your care team if you are unable to keep an appointment. What may interact with this medication? Do not take this medication with any of the following: Deferoxamine Dimercaprol Other iron products This medication may also interact with the following: Chloramphenicol Deferasirox This list may not describe all possible interactions. Give your health care provider a list of all the medicines, herbs, non-prescription drugs, or dietary supplements you use. Also tell them if you smoke, drink alcohol, or use illegal drugs. Some items may interact with your medicine. What should I watch for while using this medication? Visit your care team regularly. Tell your care team if your symptoms do not start to get better or if they get worse. You may need blood work done while you are taking this medication. You may need to follow a special diet. Talk to your care team. Foods that contain iron include: whole grains/cereals, dried fruits, beans, or peas, leafy green vegetables, and organ meats (liver, kidney). What side effects may I notice from receiving this medication? Side effects that you should report to your care team as soon as  possible: Allergic reactions--skin rash, itching, hives, swelling of the face, lips, tongue, or throat Low blood pressure--dizziness, feeling faint or lightheaded, blurry vision Shortness of breath Side effects that usually do not require medical attention (report to your care team if they continue or are bothersome): Flushing Headache Joint pain Muscle pain Nausea Pain, redness, or irritation at injection site This list may not describe all possible side effects. Call your doctor for medical advice about side effects. You may report side effects to FDA at 1-800-FDA-1088. Where should I keep my medication? This medication is given in a hospital or clinic and will not be stored at home. NOTE: This sheet is a summary. It may not cover all possible information. If you have questions about this medicine, talk to your doctor, pharmacist, or health care provider.  2022 Elsevier/Gold Standard (2020-11-27 00:00:00)  

## 2021-06-08 ENCOUNTER — Other Ambulatory Visit: Payer: Self-pay

## 2021-06-08 ENCOUNTER — Inpatient Hospital Stay: Payer: Medicare HMO

## 2021-06-08 VITALS — BP 131/65 | HR 95 | Resp 18

## 2021-06-08 DIAGNOSIS — Z79899 Other long term (current) drug therapy: Secondary | ICD-10-CM | POA: Diagnosis not present

## 2021-06-08 DIAGNOSIS — K754 Autoimmune hepatitis: Secondary | ICD-10-CM | POA: Diagnosis not present

## 2021-06-08 DIAGNOSIS — D500 Iron deficiency anemia secondary to blood loss (chronic): Secondary | ICD-10-CM

## 2021-06-08 DIAGNOSIS — Z7982 Long term (current) use of aspirin: Secondary | ICD-10-CM | POA: Diagnosis not present

## 2021-06-08 DIAGNOSIS — D509 Iron deficiency anemia, unspecified: Secondary | ICD-10-CM | POA: Diagnosis not present

## 2021-06-08 DIAGNOSIS — N1831 Chronic kidney disease, stage 3a: Secondary | ICD-10-CM | POA: Diagnosis not present

## 2021-06-08 MED ORDER — SODIUM CHLORIDE 0.9 % IV SOLN
Freq: Once | INTRAVENOUS | Status: AC
  Filled 2021-06-08: qty 250

## 2021-06-08 MED ORDER — SODIUM CHLORIDE 0.9 % IV SOLN: 200.0000 mg | Freq: Once | INTRAVENOUS | Status: DC

## 2021-06-08 MED ORDER — IRON SUCROSE 20 MG/ML IV SOLN
200.0000 mg | Freq: Once | INTRAVENOUS | Status: AC
  Administered 2021-06-08: 200 mg via INTRAVENOUS
  Filled 2021-06-08: qty 10

## 2021-06-16 ENCOUNTER — Inpatient Hospital Stay: Payer: Medicare HMO

## 2021-06-16 ENCOUNTER — Other Ambulatory Visit: Payer: Self-pay

## 2021-06-16 VITALS — BP 140/61 | HR 57 | Temp 95.6°F | Resp 20

## 2021-06-16 DIAGNOSIS — D509 Iron deficiency anemia, unspecified: Secondary | ICD-10-CM | POA: Diagnosis not present

## 2021-06-16 DIAGNOSIS — D500 Iron deficiency anemia secondary to blood loss (chronic): Secondary | ICD-10-CM

## 2021-06-16 DIAGNOSIS — N1831 Chronic kidney disease, stage 3a: Secondary | ICD-10-CM | POA: Diagnosis not present

## 2021-06-16 DIAGNOSIS — K754 Autoimmune hepatitis: Secondary | ICD-10-CM | POA: Diagnosis not present

## 2021-06-16 DIAGNOSIS — Z7982 Long term (current) use of aspirin: Secondary | ICD-10-CM | POA: Diagnosis not present

## 2021-06-16 DIAGNOSIS — Z79899 Other long term (current) drug therapy: Secondary | ICD-10-CM | POA: Diagnosis not present

## 2021-06-16 MED ORDER — IRON SUCROSE 20 MG/ML IV SOLN
200.0000 mg | Freq: Once | INTRAVENOUS | Status: AC
  Administered 2021-06-16: 200 mg via INTRAVENOUS
  Filled 2021-06-16: qty 10

## 2021-06-16 MED ORDER — SODIUM CHLORIDE 0.9 % IV SOLN: 200.0000 mg | Freq: Once | INTRAVENOUS | Status: DC

## 2021-06-16 MED ORDER — SODIUM CHLORIDE 0.9 % IV SOLN
Freq: Once | INTRAVENOUS | Status: AC
  Filled 2021-06-16: qty 250

## 2021-06-22 DIAGNOSIS — I10 Essential (primary) hypertension: Secondary | ICD-10-CM | POA: Diagnosis not present

## 2021-06-22 DIAGNOSIS — N1831 Chronic kidney disease, stage 3a: Secondary | ICD-10-CM | POA: Diagnosis not present

## 2021-06-22 DIAGNOSIS — N2581 Secondary hyperparathyroidism of renal origin: Secondary | ICD-10-CM | POA: Diagnosis not present

## 2021-07-19 ENCOUNTER — Encounter: Payer: Self-pay | Admitting: Oncology

## 2021-07-21 ENCOUNTER — Other Ambulatory Visit: Payer: Self-pay | Admitting: *Deleted

## 2021-07-21 DIAGNOSIS — D500 Iron deficiency anemia secondary to blood loss (chronic): Secondary | ICD-10-CM

## 2021-07-26 ENCOUNTER — Other Ambulatory Visit: Payer: Self-pay

## 2021-07-26 ENCOUNTER — Inpatient Hospital Stay: Payer: Medicare PPO | Attending: Oncology

## 2021-07-26 DIAGNOSIS — D500 Iron deficiency anemia secondary to blood loss (chronic): Secondary | ICD-10-CM

## 2021-07-26 DIAGNOSIS — D509 Iron deficiency anemia, unspecified: Secondary | ICD-10-CM | POA: Diagnosis not present

## 2021-07-26 DIAGNOSIS — Z79899 Other long term (current) drug therapy: Secondary | ICD-10-CM | POA: Diagnosis not present

## 2021-07-26 DIAGNOSIS — N1831 Chronic kidney disease, stage 3a: Secondary | ICD-10-CM | POA: Insufficient documentation

## 2021-07-26 DIAGNOSIS — Z7982 Long term (current) use of aspirin: Secondary | ICD-10-CM | POA: Diagnosis not present

## 2021-07-26 DIAGNOSIS — K754 Autoimmune hepatitis: Secondary | ICD-10-CM | POA: Insufficient documentation

## 2021-07-26 LAB — CBC WITH DIFFERENTIAL/PLATELET
Abs Immature Granulocytes: 0.08 10*3/uL — ABNORMAL HIGH (ref 0.00–0.07)
Basophils Absolute: 0 10*3/uL (ref 0.0–0.1)
Basophils Relative: 0 %
Eosinophils Absolute: 0 10*3/uL (ref 0.0–0.5)
Eosinophils Relative: 0 %
HCT: 39.8 % (ref 36.0–46.0)
Hemoglobin: 12 g/dL (ref 12.0–15.0)
Immature Granulocytes: 1 %
Lymphocytes Relative: 7 %
Lymphs Abs: 0.5 10*3/uL — ABNORMAL LOW (ref 0.7–4.0)
MCH: 26.3 pg (ref 26.0–34.0)
MCHC: 30.2 g/dL (ref 30.0–36.0)
MCV: 87.3 fL (ref 80.0–100.0)
Monocytes Absolute: 0.2 10*3/uL (ref 0.1–1.0)
Monocytes Relative: 2 %
Neutro Abs: 6.9 10*3/uL (ref 1.7–7.7)
Neutrophils Relative %: 90 %
Platelets: 188 10*3/uL (ref 150–400)
RBC: 4.56 MIL/uL (ref 3.87–5.11)
RDW: 22.5 % — ABNORMAL HIGH (ref 11.5–15.5)
WBC: 7.7 10*3/uL (ref 4.0–10.5)
nRBC: 0 % (ref 0.0–0.2)

## 2021-07-26 LAB — RETIC PANEL
Immature Retic Fract: 16.9 % — ABNORMAL HIGH (ref 2.3–15.9)
RBC.: 4.56 MIL/uL (ref 3.87–5.11)
Retic Count, Absolute: 67.9 10*3/uL (ref 19.0–186.0)
Retic Ct Pct: 1.5 % (ref 0.4–3.1)
Reticulocyte Hemoglobin: 27 pg — ABNORMAL LOW (ref >27.9)

## 2021-07-26 LAB — COMPREHENSIVE METABOLIC PANEL
ALT: 8 U/L (ref 0–44)
AST: 20 U/L (ref 15–41)
Albumin: 3.6 g/dL (ref 3.5–5.0)
Alkaline Phosphatase: 39 U/L (ref 38–126)
Anion gap: 10 (ref 5–15)
BUN: 27 mg/dL — ABNORMAL HIGH (ref 8–23)
CO2: 21 mmol/L — ABNORMAL LOW (ref 22–32)
Calcium: 8.5 mg/dL — ABNORMAL LOW (ref 8.9–10.3)
Chloride: 106 mmol/L (ref 98–111)
Creatinine, Ser: 1.26 mg/dL — ABNORMAL HIGH (ref 0.44–1.00)
GFR, Estimated: 45 mL/min — ABNORMAL LOW (ref >60)
Glucose, Bld: 101 mg/dL — ABNORMAL HIGH (ref 70–99)
Potassium: 3.3 mmol/L — ABNORMAL LOW (ref 3.5–5.1)
Sodium: 137 mmol/L (ref 135–145)
Total Bilirubin: 0.7 mg/dL (ref 0.3–1.2)
Total Protein: 7 g/dL (ref 6.5–8.1)

## 2021-07-26 LAB — IRON AND TIBC
Iron: 35 ug/dL (ref 28–170)
Saturation Ratios: 14 % (ref 10.4–31.8)
TIBC: 246 ug/dL — ABNORMAL LOW (ref 250–450)
UIBC: 211 ug/dL

## 2021-07-26 LAB — FOLATE: Folate: 8.6 ng/mL (ref >5.9)

## 2021-07-27 ENCOUNTER — Encounter: Payer: Self-pay | Admitting: Oncology

## 2021-07-27 ENCOUNTER — Inpatient Hospital Stay: Payer: Medicare PPO

## 2021-07-27 ENCOUNTER — Inpatient Hospital Stay (HOSPITAL_BASED_OUTPATIENT_CLINIC_OR_DEPARTMENT_OTHER): Payer: Medicare PPO | Admitting: Oncology

## 2021-07-27 VITALS — BP 138/79 | HR 56 | Temp 98.1°F | Wt 135.0 lb

## 2021-07-27 DIAGNOSIS — D500 Iron deficiency anemia secondary to blood loss (chronic): Secondary | ICD-10-CM

## 2021-07-27 DIAGNOSIS — N1831 Chronic kidney disease, stage 3a: Secondary | ICD-10-CM | POA: Diagnosis not present

## 2021-07-27 DIAGNOSIS — K754 Autoimmune hepatitis: Secondary | ICD-10-CM | POA: Diagnosis not present

## 2021-07-27 DIAGNOSIS — Z7982 Long term (current) use of aspirin: Secondary | ICD-10-CM | POA: Diagnosis not present

## 2021-07-27 DIAGNOSIS — Z79899 Other long term (current) drug therapy: Secondary | ICD-10-CM | POA: Diagnosis not present

## 2021-07-27 DIAGNOSIS — D509 Iron deficiency anemia, unspecified: Secondary | ICD-10-CM | POA: Diagnosis not present

## 2021-07-27 NOTE — Progress Notes (Signed)
Hematology/Oncology Consult note [phone] [phone]      Patient Care Team: Margaretann Loveless, MD as PCP - General (Internal Medicine)  REFERRING PROVIDER: Margaretann Loveless, MD  CHIEF COMPLAINTS/REASON FOR VISIT:  Iron deficiency anemia  HISTORY OF PRESENTING ILLNESS:   [name] [name] is a  76 y.o.  female with PMH listed below was seen in consultation at the request of  Margaretann Loveless, MD  for evaluation of anemia  Patient has chronic anemia.  I reviewed patient's previous medical records, anemia can be dated back to August 2019. Usually her baseline is 10-11. 05/18/2021, patient has had blood work done at [facility]. CBC showed a hemoglobin of 7.3, MCV 72.5, normal WBC and platelet count CMP showed creatinine 1.1 with a GFR of 48., AST and ALT is not elevated. Differential showed lymphocytopenia with an absolute lymphocyte of 0.57, normal neutrophil count at 5.85, increased neutrophil percentage 80.6% and decreased lymphocyte percentage to 7.9%. Iron panel showed ferritin of 6, iron saturation 7, Patient follows up with gastroenterology for autoimmune hepatitis.  Patient is on azathioprine 25 mg daily and prednisone 5 mg daily.  Patient has chronic mild pruritus.  12/02/2020 MRI abdomen MRCP with and without contrast pancreatic cysts  05/21/2021, upper endoscopy showed gastritis.  Pathology confirmed chronic gastritis, negative for active inflammation and H. pylori, negative for intestinal metaplasia, dysplasia and malignancy. 08/27/2018, colonoscopy showed diverticulosis in the sigmoid colon.  Internal hemorrhoids.  Patient takes oral iron supplementation once daily.  Tolerates well.   INTERVAL HISTORY [name] [name] is a 76 y.o. female who has above history reviewed by me today presents for follow up visit for management of iron deficiency anemia. Is status post IV Venofer treatments.  Patient tolerates treatments.  She reports feeling much better.   Fatigue has improved. No other new complaints.   Review of Systems  Constitutional:  Negative for appetite change, chills, fatigue and fever.  HENT:   Negative for hearing loss and voice change.   Eyes:  Negative for eye problems.  Respiratory:  Negative for chest tightness and cough.   Cardiovascular:  Negative for chest pain.  Gastrointestinal:  Negative for abdominal distention, abdominal pain and blood in stool.  Endocrine: Negative for hot flashes.  Genitourinary:  Negative for difficulty urinating and frequency.   Musculoskeletal:  Negative for arthralgias.  Skin:  Positive for itching. Negative for rash.  Neurological:  Negative for extremity weakness.  Hematological:  Negative for adenopathy.  Psychiatric/Behavioral:  Negative for confusion.    MEDICAL HISTORY:  Past Medical History:  Diagnosis Date   Abnormal LFTs (liver function tests)    Autoimmune hepatitis (HCC) 2018   Autoimmune hepatitis (HCC)    Autoimmune hepatitis treated with steroids (HCC)    DOE (dyspnea on exertion)    GERD (gastroesophageal reflux disease)    Hepatitis    Hyperlipidemia    Hypertension    Iron deficiency anemia due to chronic blood loss 05/27/2021   Osteoporosis     SURGICAL HISTORY: Past Surgical History:  Procedure Laterality Date   ABDOMINAL HYSTERECTOMY     COLONOSCOPY  10/06/2015   COLONOSCOPY WITH PROPOFOL N/A 08/27/2018   Procedure: COLONOSCOPY WITH PROPOFOL;  Surgeon: Scot Jun, MD;  Location: [facility] ENDOSCOPY;  Service: Endoscopy;  Laterality: N/A;   ESOPHAGOGASTRODUODENOSCOPY (EGD) WITH PROPOFOL N/A 08/27/2018   Procedure: ESOPHAGOGASTRODUODENOSCOPY (EGD) WITH PROPOFOL;  Surgeon: Scot Jun, MD;  Location: [facility] ENDOSCOPY;  Service: Endoscopy;  Laterality: N/A;  ESOPHAGOGASTRODUODENOSCOPY (EGD) WITH PROPOFOL N/A 05/21/2021   Procedure: ESOPHAGOGASTRODUODENOSCOPY (EGD) WITH PROPOFOL;  Surgeon: Regis Bill, MD;  Location: [facility] ENDOSCOPY;  Service:  Endoscopy;  Laterality: N/A;   LIVER BIOPSY      SOCIAL HISTORY: Social History   Socioeconomic History   Marital status: Married    Spouse name: Not on file   Number of children: Not on file   Years of education: Not on file   Highest education level: Not on file  Occupational History   Not on file  Tobacco Use   Smoking status: Never   Smokeless tobacco: Never  Vaping Use   Vaping Use: Never used  Substance and Sexual Activity   Alcohol use: No   Drug use: Never   Sexual activity: Not Currently    Birth control/protection: Post-menopausal  Other Topics Concern   Not on file  Social History Narrative   Not on file   Social Determinants of Health   Financial Resource Strain: Not on file  Food Insecurity: Not on file  Transportation Needs: Not on file  Physical Activity: Not on file  Stress: Not on file  Social Connections: Not on file  Intimate Partner Violence: Not on file    FAMILY HISTORY: Family History  Problem Relation Age of Onset   Cancer Mother     ALLERGIES:  is allergic to sulfa antibiotics and sulfasalazine.  MEDICATIONS:  Current Outpatient Medications  Medication Sig Dispense Refill   amLODipine (NORVASC) 5 MG tablet Take 5 mg by mouth daily.  6   aspirin EC 81 MG tablet Take 81 mg by mouth daily.      azaTHIOprine (IMURAN) 50 MG tablet Take 50 mg by mouth daily.  0   Calcium Carbonate Antacid 600 MG chewable tablet Chew 600 mg by mouth.     calcium citrate-vitamin D (CITRACAL+D) 315-200 MG-UNIT tablet Take 1 tablet by mouth 2 (two) times daily.     Cholecalciferol 1.25 MG (50000 UT) capsule once a week.     cyanocobalamin 100 MCG tablet Take by mouth.     cyanocobalamin 1000 MCG tablet Take 1,000 mcg by mouth daily.     [START ON 10/11/2021] denosumab (PROLIA) 60 MG/ML SOSY injection Inject into the skin.     estradiol (ESTRACE) 0.1 MG/GM vaginal cream Insert pea size amount daily x 2 weeks then every other day x 2 weeks then 2-3 times weekly  for maintenance     Ferrous Fumarate (HEMOCYTE - 106 MG FE) 324 (106 Fe) MG TABS tablet Take 1 tablet by mouth.     Ferrous Fumarate (HEMOCYTE - 106 MG FE) 324 (106 Fe) MG TABS tablet Take by mouth.     metoprolol tartrate (LOPRESSOR) 50 MG tablet Take 50 mg by mouth daily.     Misc. Devices (WALKER) MISC 1 Device by Does not apply route as directed. 1 each 0   omeprazole (PRILOSEC) 40 MG capsule Take by mouth.     potassium chloride (KLOR-CON) 10 MEQ tablet Take by mouth.     predniSONE (DELTASONE) 5 MG tablet Take 7.5 mg by mouth daily.  3   PROLIA 60 MG/ML SOSY injection Inject into the skin.     alendronate (FOSAMAX) 70 MG tablet Take 70 mg by mouth once a week. Take with a full glass of water on an empty stomach. (Patient not taking: Reported on 05/21/2021)     benazepril (LOTENSIN) 40 MG tablet Take 40 mg by mouth daily.  (Patient not taking: Reported  on 05/21/2021)     No current facility-administered medications for this visit.     PHYSICAL EXAMINATION: ECOG PERFORMANCE STATUS: 1 - Symptomatic but completely ambulatory Vitals:   07/27/21 1412  BP: 138/79  Pulse: (!) 56  Temp: 98.1 F (36.7 C)   Filed Weights   07/27/21 1412  Weight: 135 lb (61.2 kg)    Physical Exam Constitutional:      General: She is not in acute distress. HENT:     Head: Normocephalic and atraumatic.  Eyes:     General: No scleral icterus. Cardiovascular:     Rate and Rhythm: Normal rate and regular rhythm.     Heart sounds: Normal heart sounds.  Pulmonary:     Effort: Pulmonary effort is normal. No respiratory distress.     Breath sounds: No wheezing.  Abdominal:     General: Bowel sounds are normal. There is no distension.     Palpations: Abdomen is soft.  Musculoskeletal:        General: No deformity. Normal range of motion.     Cervical back: Normal range of motion and neck supple.  Skin:    General: Skin is warm and dry.     Findings: No erythema or rash.  Neurological:     Mental  Status: She is alert and oriented to person, place, and time. Mental status is at baseline.     Cranial Nerves: No cranial nerve deficit.     Coordination: Coordination normal.  Psychiatric:        Mood and Affect: Mood normal.    LABORATORY DATA:  I have reviewed the data as listed Lab Results  Component Value Date   WBC 7.7 07/26/2021   HGB 12.0 07/26/2021   HCT 39.8 07/26/2021   MCV 87.3 07/26/2021   PLT 188 07/26/2021   Recent Labs    07/26/21 1314  NA 137  K 3.3*  CL 106  CO2 21*  GLUCOSE 101*  BUN 27*  CREATININE 1.26*  CALCIUM 8.5*  GFRNONAA 45*  PROT 7.0  ALBUMIN 3.6  AST 20  ALT 8  ALKPHOS 39  BILITOT 0.7   Iron/TIBC/Ferritin/ %Sat    Component Value Date/Time   IRON 35 07/26/2021 1314   TIBC 246 (L) 07/26/2021 1314   IRONPCTSAT 14 07/26/2021 1314      RADIOGRAPHIC STUDIES: I have personally reviewed the radiological images as listed and agreed with the findings in the report. No results found.    ASSESSMENT & PLAN:  1. Iron deficiency anemia due to chronic blood loss   2. Stage 3a chronic kidney disease ([place])   3. Autoimmune hepatitis ([place])    Iron deficiency anemia Labs reviewed and discussed with patient Hemoglobin has normalized. Iron panel is normal. No need for additional IV Venofer treatment at this point Recommend patient to continue oral iron supplementation. Monitor counts.  If her counts drops again, she will need to see gastroenterology for additional work-up.  Autoimmune hepatitis, on azathioprine and prednisone.    Chronic kidney disease, stage III.  Check SPEP   Orders Placed This Encounter  Procedures   CBC with Differential/Platelet    Standing Status:   Future    Standing Expiration Date:   07/27/2022   Ferritin    Standing Status:   Future    Standing Expiration Date:   07/27/2022   Iron and TIBC    Standing Status:   Future    Standing Expiration Date:   07/27/2022   Retic Panel  Standing Status:   Future     Standing Expiration Date:   07/27/2022     All questions were answered. The patient knows to call the clinic with any problems questions or concerns.   Perrin Maltese, MD   Follow-up plan, lab .  CBC, reticulocyte panel, iron TIBC ferritin, 3 days prior to MD +/- Venofer.    Earlie Server, MD, PhD  07/27/2021

## 2021-07-29 LAB — PROTEIN ELECTROPHORESIS, SERUM
A/G Ratio: 1 (ref 0.7–1.7)
Albumin ELP: 3.3 g/dL (ref 2.9–4.4)
Alpha-1-Globulin: 0.3 g/dL (ref 0.0–0.4)
Alpha-2-Globulin: 1 g/dL (ref 0.4–1.0)
Beta Globulin: 0.9 g/dL (ref 0.7–1.3)
Gamma Globulin: 1 g/dL (ref 0.4–1.8)
Globulin, Total: 3.2 g/dL (ref 2.2–3.9)
Total Protein ELP: 6.5 g/dL (ref 6.0–8.5)

## 2021-08-03 DIAGNOSIS — K754 Autoimmune hepatitis: Secondary | ICD-10-CM | POA: Diagnosis not present

## 2021-08-03 DIAGNOSIS — D509 Iron deficiency anemia, unspecified: Secondary | ICD-10-CM | POA: Diagnosis not present

## 2021-08-06 ENCOUNTER — Other Ambulatory Visit: Payer: Self-pay | Admitting: Gastroenterology

## 2021-08-06 DIAGNOSIS — R634 Abnormal weight loss: Secondary | ICD-10-CM

## 2021-08-06 DIAGNOSIS — D509 Iron deficiency anemia, unspecified: Secondary | ICD-10-CM

## 2021-08-16 ENCOUNTER — Ambulatory Visit
Admission: RE | Admit: 2021-08-16 | Discharge: 2021-08-16 | Disposition: A | Discharge status: Home / Self Care | Payer: Medicare PPO | Source: Ambulatory Visit | Attending: Gastroenterology | Admitting: Gastroenterology

## 2021-08-16 ENCOUNTER — Other Ambulatory Visit: Payer: Self-pay

## 2021-08-16 DIAGNOSIS — R569 Unspecified convulsions: Secondary | ICD-10-CM | POA: Diagnosis not present

## 2021-08-16 DIAGNOSIS — Z7983 Long term (current) use of bisphosphonates: Secondary | ICD-10-CM | POA: Diagnosis not present

## 2021-08-16 DIAGNOSIS — S32511A Fracture of superior rim of right pubis, initial encounter for closed fracture: Secondary | ICD-10-CM | POA: Diagnosis not present

## 2021-08-16 DIAGNOSIS — M25572 Pain in left ankle and joints of left foot: Secondary | ICD-10-CM | POA: Diagnosis not present

## 2021-08-16 DIAGNOSIS — Z20822 Contact with and (suspected) exposure to covid-19: Secondary | ICD-10-CM | POA: Diagnosis present

## 2021-08-16 DIAGNOSIS — I129 Hypertensive chronic kidney disease with stage 1 through stage 4 chronic kidney disease, or unspecified chronic kidney disease: Secondary | ICD-10-CM | POA: Diagnosis present

## 2021-08-16 DIAGNOSIS — Z7982 Long term (current) use of aspirin: Secondary | ICD-10-CM | POA: Diagnosis not present

## 2021-08-16 DIAGNOSIS — N189 Chronic kidney disease, unspecified: Secondary | ICD-10-CM | POA: Diagnosis not present

## 2021-08-16 DIAGNOSIS — M810 Age-related osteoporosis without current pathological fracture: Secondary | ICD-10-CM | POA: Diagnosis present

## 2021-08-16 DIAGNOSIS — K754 Autoimmune hepatitis: Secondary | ICD-10-CM | POA: Diagnosis present

## 2021-08-16 DIAGNOSIS — Z471 Aftercare following joint replacement surgery: Secondary | ICD-10-CM | POA: Diagnosis not present

## 2021-08-16 DIAGNOSIS — E785 Hyperlipidemia, unspecified: Secondary | ICD-10-CM | POA: Diagnosis present

## 2021-08-16 DIAGNOSIS — D62 Acute posthemorrhagic anemia: Secondary | ICD-10-CM | POA: Diagnosis not present

## 2021-08-16 DIAGNOSIS — S7291XA Unspecified fracture of right femur, initial encounter for closed fracture: Secondary | ICD-10-CM | POA: Diagnosis not present

## 2021-08-16 DIAGNOSIS — S7291XD Unspecified fracture of right femur, subsequent encounter for closed fracture with routine healing: Secondary | ICD-10-CM | POA: Diagnosis not present

## 2021-08-16 DIAGNOSIS — N179 Acute kidney failure, unspecified: Secondary | ICD-10-CM | POA: Diagnosis present

## 2021-08-16 DIAGNOSIS — B351 Tinea unguium: Secondary | ICD-10-CM | POA: Diagnosis present

## 2021-08-16 DIAGNOSIS — D649 Anemia, unspecified: Secondary | ICD-10-CM | POA: Diagnosis not present

## 2021-08-16 DIAGNOSIS — Z79818 Long term (current) use of other agents affecting estrogen receptors and estrogen levels: Secondary | ICD-10-CM | POA: Diagnosis not present

## 2021-08-16 DIAGNOSIS — N1831 Chronic kidney disease, stage 3a: Secondary | ICD-10-CM | POA: Diagnosis present

## 2021-08-16 DIAGNOSIS — Z96649 Presence of unspecified artificial hip joint: Secondary | ICD-10-CM | POA: Diagnosis not present

## 2021-08-16 DIAGNOSIS — R110 Nausea: Secondary | ICD-10-CM | POA: Diagnosis not present

## 2021-08-16 DIAGNOSIS — Z79899 Other long term (current) drug therapy: Secondary | ICD-10-CM | POA: Diagnosis not present

## 2021-08-16 DIAGNOSIS — Z96641 Presence of right artificial hip joint: Secondary | ICD-10-CM | POA: Diagnosis not present

## 2021-08-16 DIAGNOSIS — R634 Abnormal weight loss: Secondary | ICD-10-CM | POA: Diagnosis not present

## 2021-08-16 DIAGNOSIS — I4891 Unspecified atrial fibrillation: Secondary | ICD-10-CM | POA: Diagnosis not present

## 2021-08-16 DIAGNOSIS — W19XXXA Unspecified fall, initial encounter: Secondary | ICD-10-CM | POA: Diagnosis not present

## 2021-08-16 DIAGNOSIS — D696 Thrombocytopenia, unspecified: Secondary | ICD-10-CM | POA: Diagnosis not present

## 2021-08-16 DIAGNOSIS — R339 Retention of urine, unspecified: Secondary | ICD-10-CM | POA: Diagnosis present

## 2021-08-16 DIAGNOSIS — S72001S Fracture of unspecified part of neck of right femur, sequela: Secondary | ICD-10-CM | POA: Diagnosis not present

## 2021-08-16 DIAGNOSIS — S72031A Displaced midcervical fracture of right femur, initial encounter for closed fracture: Secondary | ICD-10-CM | POA: Diagnosis not present

## 2021-08-16 DIAGNOSIS — W010XXA Fall on same level from slipping, tripping and stumbling without subsequent striking against object, initial encounter: Secondary | ICD-10-CM | POA: Diagnosis present

## 2021-08-16 DIAGNOSIS — D849 Immunodeficiency, unspecified: Secondary | ICD-10-CM | POA: Diagnosis not present

## 2021-08-16 DIAGNOSIS — E871 Hypo-osmolality and hyponatremia: Secondary | ICD-10-CM | POA: Diagnosis not present

## 2021-08-16 DIAGNOSIS — R41841 Cognitive communication deficit: Secondary | ICD-10-CM | POA: Diagnosis not present

## 2021-08-16 DIAGNOSIS — M6281 Muscle weakness (generalized): Secondary | ICD-10-CM | POA: Diagnosis not present

## 2021-08-16 DIAGNOSIS — I700 Atherosclerosis of aorta: Secondary | ICD-10-CM | POA: Diagnosis not present

## 2021-08-16 DIAGNOSIS — D509 Iron deficiency anemia, unspecified: Secondary | ICD-10-CM

## 2021-08-16 DIAGNOSIS — Z9889 Other specified postprocedural states: Secondary | ICD-10-CM | POA: Diagnosis not present

## 2021-08-16 DIAGNOSIS — S72009A Fracture of unspecified part of neck of unspecified femur, initial encounter for closed fracture: Secondary | ICD-10-CM | POA: Diagnosis present

## 2021-08-16 DIAGNOSIS — K219 Gastro-esophageal reflux disease without esophagitis: Secondary | ICD-10-CM | POA: Diagnosis present

## 2021-08-16 DIAGNOSIS — K449 Diaphragmatic hernia without obstruction or gangrene: Secondary | ICD-10-CM | POA: Diagnosis not present

## 2021-08-16 DIAGNOSIS — S72001A Fracture of unspecified part of neck of right femur, initial encounter for closed fracture: Secondary | ICD-10-CM | POA: Diagnosis present

## 2021-08-16 DIAGNOSIS — R69 Illness, unspecified: Secondary | ICD-10-CM | POA: Diagnosis not present

## 2021-08-16 DIAGNOSIS — Z4789 Encounter for other orthopedic aftercare: Secondary | ICD-10-CM | POA: Diagnosis not present

## 2021-08-16 DIAGNOSIS — Z882 Allergy status to sulfonamides status: Secondary | ICD-10-CM | POA: Diagnosis not present

## 2021-08-16 DIAGNOSIS — R2689 Other abnormalities of gait and mobility: Secondary | ICD-10-CM | POA: Diagnosis not present

## 2021-08-16 DIAGNOSIS — R5381 Other malaise: Secondary | ICD-10-CM | POA: Diagnosis not present

## 2021-08-16 DIAGNOSIS — M978XXA Periprosthetic fracture around other internal prosthetic joint, initial encounter: Secondary | ICD-10-CM | POA: Diagnosis not present

## 2021-08-16 DIAGNOSIS — M25551 Pain in right hip: Secondary | ICD-10-CM | POA: Diagnosis not present

## 2021-08-16 DIAGNOSIS — I10 Essential (primary) hypertension: Secondary | ICD-10-CM | POA: Diagnosis not present

## 2021-08-16 DIAGNOSIS — R338 Other retention of urine: Secondary | ICD-10-CM | POA: Diagnosis not present

## 2021-08-16 MED ORDER — IOHEXOL 300 MG/ML  SOLN
75.0000 mL | Freq: Once | INTRAMUSCULAR | Status: AC | PRN
  Administered 2021-08-16: 75 mL via INTRAVENOUS

## 2021-08-19 ENCOUNTER — Emergency Department: Payer: Medicare PPO

## 2021-08-19 ENCOUNTER — Other Ambulatory Visit: Payer: Self-pay

## 2021-08-19 ENCOUNTER — Inpatient Hospital Stay
Admission: EM | Admit: 2021-08-19 | Discharge: 2021-08-24 | DRG: 522 | Disposition: A | Discharge status: Skilled Nursing Facility | Payer: Medicare PPO | Attending: Internal Medicine | Admitting: Internal Medicine

## 2021-08-19 DIAGNOSIS — K219 Gastro-esophageal reflux disease without esophagitis: Secondary | ICD-10-CM | POA: Diagnosis present

## 2021-08-19 DIAGNOSIS — I10 Essential (primary) hypertension: Secondary | ICD-10-CM | POA: Diagnosis not present

## 2021-08-19 DIAGNOSIS — S72001A Fracture of unspecified part of neck of right femur, initial encounter for closed fracture: Principal | ICD-10-CM

## 2021-08-19 DIAGNOSIS — R110 Nausea: Secondary | ICD-10-CM | POA: Diagnosis not present

## 2021-08-19 DIAGNOSIS — D509 Iron deficiency anemia, unspecified: Secondary | ICD-10-CM | POA: Diagnosis present

## 2021-08-19 DIAGNOSIS — Z79818 Long term (current) use of other agents affecting estrogen receptors and estrogen levels: Secondary | ICD-10-CM

## 2021-08-19 DIAGNOSIS — R339 Retention of urine, unspecified: Secondary | ICD-10-CM | POA: Diagnosis present

## 2021-08-19 DIAGNOSIS — R338 Other retention of urine: Secondary | ICD-10-CM

## 2021-08-19 DIAGNOSIS — S72001S Fracture of unspecified part of neck of right femur, sequela: Secondary | ICD-10-CM | POA: Diagnosis not present

## 2021-08-19 DIAGNOSIS — N1831 Chronic kidney disease, stage 3a: Secondary | ICD-10-CM | POA: Diagnosis present

## 2021-08-19 DIAGNOSIS — D62 Acute posthemorrhagic anemia: Secondary | ICD-10-CM | POA: Diagnosis not present

## 2021-08-19 DIAGNOSIS — M810 Age-related osteoporosis without current pathological fracture: Secondary | ICD-10-CM | POA: Diagnosis present

## 2021-08-19 DIAGNOSIS — I129 Hypertensive chronic kidney disease with stage 1 through stage 4 chronic kidney disease, or unspecified chronic kidney disease: Secondary | ICD-10-CM | POA: Diagnosis present

## 2021-08-19 DIAGNOSIS — Z882 Allergy status to sulfonamides status: Secondary | ICD-10-CM

## 2021-08-19 DIAGNOSIS — N179 Acute kidney failure, unspecified: Secondary | ICD-10-CM | POA: Diagnosis present

## 2021-08-19 DIAGNOSIS — N189 Chronic kidney disease, unspecified: Secondary | ICD-10-CM | POA: Diagnosis not present

## 2021-08-19 DIAGNOSIS — D849 Immunodeficiency, unspecified: Secondary | ICD-10-CM | POA: Diagnosis not present

## 2021-08-19 DIAGNOSIS — D696 Thrombocytopenia, unspecified: Secondary | ICD-10-CM | POA: Diagnosis not present

## 2021-08-19 DIAGNOSIS — M978XXA Periprosthetic fracture around other internal prosthetic joint, initial encounter: Secondary | ICD-10-CM | POA: Diagnosis not present

## 2021-08-19 DIAGNOSIS — W010XXA Fall on same level from slipping, tripping and stumbling without subsequent striking against object, initial encounter: Secondary | ICD-10-CM | POA: Diagnosis present

## 2021-08-19 DIAGNOSIS — E785 Hyperlipidemia, unspecified: Secondary | ICD-10-CM | POA: Diagnosis present

## 2021-08-19 DIAGNOSIS — Z96649 Presence of unspecified artificial hip joint: Secondary | ICD-10-CM | POA: Diagnosis not present

## 2021-08-19 DIAGNOSIS — R569 Unspecified convulsions: Secondary | ICD-10-CM | POA: Diagnosis not present

## 2021-08-19 DIAGNOSIS — S72009A Fracture of unspecified part of neck of unspecified femur, initial encounter for closed fracture: Secondary | ICD-10-CM | POA: Diagnosis present

## 2021-08-19 DIAGNOSIS — B351 Tinea unguium: Secondary | ICD-10-CM

## 2021-08-19 DIAGNOSIS — Z79899 Other long term (current) drug therapy: Secondary | ICD-10-CM

## 2021-08-19 DIAGNOSIS — Z7982 Long term (current) use of aspirin: Secondary | ICD-10-CM

## 2021-08-19 DIAGNOSIS — Z7983 Long term (current) use of bisphosphonates: Secondary | ICD-10-CM

## 2021-08-19 DIAGNOSIS — D649 Anemia, unspecified: Secondary | ICD-10-CM | POA: Diagnosis not present

## 2021-08-19 DIAGNOSIS — K754 Autoimmune hepatitis: Secondary | ICD-10-CM

## 2021-08-19 DIAGNOSIS — Z20822 Contact with and (suspected) exposure to covid-19: Secondary | ICD-10-CM | POA: Diagnosis present

## 2021-08-19 DIAGNOSIS — N183 Chronic kidney disease, stage 3 unspecified: Secondary | ICD-10-CM

## 2021-08-19 DIAGNOSIS — E871 Hypo-osmolality and hyponatremia: Secondary | ICD-10-CM | POA: Diagnosis not present

## 2021-08-19 LAB — PROTIME-INR
INR: 1 (ref 0.8–1.2)
Prothrombin Time: 13.3 seconds (ref 11.4–15.2)

## 2021-08-19 LAB — CBC WITH DIFFERENTIAL/PLATELET
Abs Immature Granulocytes: 0.07 10*3/uL (ref 0.00–0.07)
Basophils Absolute: 0.1 10*3/uL (ref 0.0–0.1)
Basophils Relative: 0 %
Eosinophils Absolute: 0.3 10*3/uL (ref 0.0–0.5)
Eosinophils Relative: 3 %
HCT: 46.2 % — ABNORMAL HIGH (ref 36.0–46.0)
Hemoglobin: 14.2 g/dL (ref 12.0–15.0)
Immature Granulocytes: 1 %
Lymphocytes Relative: 12 %
Lymphs Abs: 1.3 10*3/uL (ref 0.7–4.0)
MCH: 27.6 pg (ref 26.0–34.0)
MCHC: 30.7 g/dL (ref 30.0–36.0)
MCV: 89.7 fL (ref 80.0–100.0)
Monocytes Absolute: 0.5 10*3/uL (ref 0.1–1.0)
Monocytes Relative: 5 %
Neutro Abs: 9 10*3/uL — ABNORMAL HIGH (ref 1.7–7.7)
Neutrophils Relative %: 79 %
Platelets: 167 10*3/uL (ref 150–400)
RBC: 5.15 MIL/uL — ABNORMAL HIGH (ref 3.87–5.11)
RDW: 19.1 % — ABNORMAL HIGH (ref 11.5–15.5)
WBC: 11.3 10*3/uL — ABNORMAL HIGH (ref 4.0–10.5)
nRBC: 0 % (ref 0.0–0.2)

## 2021-08-19 LAB — COMPREHENSIVE METABOLIC PANEL
ALT: 15 U/L (ref 0–44)
AST: 26 U/L (ref 15–41)
Albumin: 3.6 g/dL (ref 3.5–5.0)
Alkaline Phosphatase: 43 U/L (ref 38–126)
Anion gap: 9 (ref 5–15)
BUN: 18 mg/dL (ref 8–23)
CO2: 23 mmol/L (ref 22–32)
Calcium: 9.2 mg/dL (ref 8.9–10.3)
Chloride: 103 mmol/L (ref 98–111)
Creatinine, Ser: 1.13 mg/dL — ABNORMAL HIGH (ref 0.44–1.00)
GFR, Estimated: 51 mL/min — ABNORMAL LOW (ref >60)
Glucose, Bld: 141 mg/dL — ABNORMAL HIGH (ref 70–99)
Potassium: 3.6 mmol/L (ref 3.5–5.1)
Sodium: 135 mmol/L (ref 135–145)
Total Bilirubin: 0.7 mg/dL (ref 0.3–1.2)
Total Protein: 7.2 g/dL (ref 6.5–8.1)

## 2021-08-19 LAB — MAGNESIUM: Magnesium: 1.8 mg/dL (ref 1.7–2.4)

## 2021-08-19 LAB — RESP PANEL BY RT-PCR (FLU A&B, COVID) ARPGX2
Influenza A by PCR: NEGATIVE
Influenza B by PCR: NEGATIVE
SARS Coronavirus 2 by RT PCR: NEGATIVE

## 2021-08-19 MED ORDER — PREDNISONE 10 MG PO TABS: 15.0000 mg | ORAL_TABLET | Freq: Every day | ORAL | Status: DC

## 2021-08-19 MED ORDER — HYDROCODONE-ACETAMINOPHEN 5-325 MG PO TABS
1.0000 | ORAL_TABLET | Freq: Four times a day (QID) | ORAL | Status: DC | PRN
  Administered 2021-08-19: 2 via ORAL
  Filled 2021-08-19: qty 2

## 2021-08-19 MED ORDER — FERROUS FUMARATE 324 (106 FE) MG PO TABS
1.0000 | ORAL_TABLET | Freq: Every day | ORAL | Status: DC
  Administered 2021-08-21 – 2021-08-24 (×4): 106 mg via ORAL
  Filled 2021-08-19 (×6): qty 1

## 2021-08-19 MED ORDER — BISACODYL 10 MG RE SUPP
10.0000 mg | Freq: Every day | RECTAL | Status: DC | PRN
  Filled 2021-08-19 (×2): qty 1

## 2021-08-19 MED ORDER — CALCIUM CARBONATE ANTACID 500 MG PO CHEW
500.0000 mg | CHEWABLE_TABLET | Freq: Every day | ORAL | Status: DC
  Administered 2021-08-21 – 2021-08-24 (×4): 500 mg via ORAL
  Filled 2021-08-19 (×5): qty 3

## 2021-08-19 MED ORDER — SENNOSIDES-DOCUSATE SODIUM 8.6-50 MG PO TABS: 1.0000 | ORAL_TABLET | Freq: Every evening | ORAL | Status: DC | PRN

## 2021-08-19 MED ORDER — PANTOPRAZOLE SODIUM 40 MG PO TBEC
40.0000 mg | DELAYED_RELEASE_TABLET | Freq: Every day | ORAL | Status: DC
  Administered 2021-08-21 – 2021-08-24 (×4): 40 mg via ORAL
  Filled 2021-08-19 (×4): qty 1

## 2021-08-19 MED ORDER — FENTANYL CITRATE PF 50 MCG/ML IJ SOSY
25.0000 ug | PREFILLED_SYRINGE | Freq: Once | INTRAMUSCULAR | Status: AC
  Administered 2021-08-19: 25 ug via INTRAVENOUS
  Filled 2021-08-19: qty 1

## 2021-08-19 MED ORDER — METOPROLOL TARTRATE 50 MG PO TABS
50.0000 mg | ORAL_TABLET | Freq: Every day | ORAL | Status: DC
  Administered 2021-08-20 – 2021-08-24 (×5): 50 mg via ORAL
  Filled 2021-08-19 (×5): qty 1

## 2021-08-19 MED ORDER — CEFAZOLIN SODIUM-DEXTROSE 2-4 GM/100ML-% IV SOLN
2.0000 g | INTRAVENOUS | Status: AC
  Administered 2021-08-20: 2 g via INTRAVENOUS

## 2021-08-19 MED ORDER — ENOXAPARIN SODIUM 30 MG/0.3ML IJ SOSY: 30.0000 mg | PREFILLED_SYRINGE | INTRAMUSCULAR | Status: DC

## 2021-08-19 MED ORDER — ASPIRIN EC 81 MG PO TBEC
81.0000 mg | DELAYED_RELEASE_TABLET | Freq: Every day | ORAL | Status: DC
  Administered 2021-08-21 – 2021-08-24 (×4): 81 mg via ORAL
  Filled 2021-08-19 (×4): qty 1

## 2021-08-19 MED ORDER — AMLODIPINE BESYLATE 5 MG PO TABS
5.0000 mg | ORAL_TABLET | Freq: Every day | ORAL | Status: DC
  Administered 2021-08-21 – 2021-08-22 (×2): 5 mg via ORAL
  Filled 2021-08-19 (×2): qty 1

## 2021-08-19 MED ORDER — AZATHIOPRINE 50 MG PO TABS
25.0000 mg | ORAL_TABLET | Freq: Every day | ORAL | Status: DC
  Administered 2021-08-21 – 2021-08-24 (×4): 25 mg via ORAL
  Filled 2021-08-19 (×5): qty 1

## 2021-08-19 MED ORDER — HYDROMORPHONE HCL 1 MG/ML IJ SOLN: 0.5000 mg | INTRAMUSCULAR | Status: DC | PRN

## 2021-08-19 NOTE — ED Provider Notes (Signed)
Medical screening examination/treatment/procedure(s) were conducted as a shared visit with non-physician practitioner(s) and myself.  I personally evaluated the patient during the encounter.   ----------------------------------------- 3:18 PM on 08/19/2021 ----------------------------------------- Patient resting comfortably, reports her pain is well controlled at this time.  Patient understanding of plan for admission to the hospital and orthopedics consult for right hip fracture.  She is alert well oriented and appears pain well controlled at this time.   Delman Kitten, MD 08/19/21 [phone]

## 2021-08-19 NOTE — ED Notes (Signed)
Pt taken for xray

## 2021-08-19 NOTE — Progress Notes (Signed)
CMP not resulted. Talked to lab, 3 PM blood sample hemolyzed. Lab will re-collect again now.

## 2021-08-19 NOTE — Consult Note (Signed)
ORTHOPAEDIC CONSULTATION  REQUESTING PHYSICIAN: Emeline General, MD  Chief Complaint: Right hip pain status post fall  HPI: [name] [name] is a 76 y.o. female who complains of right hip pain after falling in her yard coming home from taking her husband to the doctor.  Patient was unable to stand after falling.  [facility]  Patient denies prodromal symptoms or syncope.  Patient endorses a mechanical fall.  She denies other injuries other than a skin tear of the left wrist.  Past Medical History:  Diagnosis Date   Abnormal LFTs (liver function tests)    Autoimmune hepatitis (HCC) 2018   Autoimmune hepatitis (HCC)    Autoimmune hepatitis treated with steroids (HCC)    DOE (dyspnea on exertion)    GERD (gastroesophageal reflux disease)    Hepatitis    Hyperlipidemia    Hypertension    Iron deficiency anemia due to chronic blood loss 05/27/2021   Osteoporosis    Past Surgical History:  Procedure Laterality Date   ABDOMINAL HYSTERECTOMY     COLONOSCOPY  10/06/2015   COLONOSCOPY WITH PROPOFOL N/A 08/27/2018   Procedure: COLONOSCOPY WITH PROPOFOL;  Surgeon: Scot Jun, MD;  Location: [facility] ENDOSCOPY;  Service: Endoscopy;  Laterality: N/A;   ESOPHAGOGASTRODUODENOSCOPY (EGD) WITH PROPOFOL N/A 08/27/2018   Procedure: ESOPHAGOGASTRODUODENOSCOPY (EGD) WITH PROPOFOL;  Surgeon: Scot Jun, MD;  Location: [facility] ENDOSCOPY;  Service: Endoscopy;  Laterality: N/A;   ESOPHAGOGASTRODUODENOSCOPY (EGD) WITH PROPOFOL N/A 05/21/2021   Procedure: ESOPHAGOGASTRODUODENOSCOPY (EGD) WITH PROPOFOL;  Surgeon: Regis Bill, MD;  Location: [facility] ENDOSCOPY;  Service: Endoscopy;  Laterality: N/A;   LIVER BIOPSY     Social History   Socioeconomic History   Marital status: Married    Spouse name: Not on file   Number of children: Not on file   Years of education: Not on file   Highest education level: Not on file  Occupational History   Not on file   Tobacco Use   Smoking status: Never   Smokeless tobacco: Never  Vaping Use   Vaping Use: Never used  Substance and Sexual Activity   Alcohol use: No   Drug use: Never   Sexual activity: Not Currently    Birth control/protection: Post-menopausal  Other Topics Concern   Not on file  Social History Narrative   Not on file   Social Determinants of Health   Financial Resource Strain: Not on file  Food Insecurity: Not on file  Transportation Needs: Not on file  Physical Activity: Not on file  Stress: Not on file  Social Connections: Not on file   Family History  Problem Relation Age of Onset   Cancer Mother    Allergies  Allergen Reactions   Sulfa Antibiotics Nausea And Vomiting    Pt has not had a sulfa drug in years and is not certain of reaction but suspects nausea and vomiting.   Sulfasalazine Nausea And Vomiting   Prior to Admission medications   Medication Sig Start Date End Date Taking? Authorizing Provider  amLODipine (NORVASC) 5 MG tablet Take 5 mg by mouth daily. 10/03/17  Yes [provider]  aspirin EC 81 MG tablet Take 81 mg by mouth daily.    Yes [provider]  azaTHIOprine (IMURAN) 50 MG tablet Take 25 mg by mouth daily. 09/22/17  Yes [provider]  calcium citrate-vitamin D (CITRACAL+D) 315-200 MG-UNIT tablet Take 1 tablet by mouth 2 (two) times daily.  Yes [provider]  cyanocobalamin 100 MCG tablet Take 100 mcg by mouth daily.   Yes [provider]  Ferrous Fumarate (HEMOCYTE - 106 MG FE) 324 (106 Fe) MG TABS tablet Take 1 tablet by mouth.   Yes [provider]  metoprolol tartrate (LOPRESSOR) 50 MG tablet Take 50 mg by mouth daily.   Yes [provider]  omeprazole (PRILOSEC) 40 MG capsule Take 40 mg by mouth daily. 08/11/20  Yes [provider]  potassium chloride (KLOR-CON) 10 MEQ tablet Take 10 mEq by mouth daily. 10/03/19 09/18/21 Yes [provider]  predniSONE  (DELTASONE) 5 MG tablet Take 7.5 mg by mouth daily. 09/17/17  Yes [provider]  alendronate (FOSAMAX) 70 MG tablet Take 70 mg by mouth once a week. Take with a full glass of water on an empty stomach. Patient not taking: Reported on 05/21/2021    [provider]  benazepril (LOTENSIN) 40 MG tablet Take 40 mg by mouth daily.  Patient not taking: Reported on 05/21/2021    [provider]  Calcium Carbonate Antacid 600 MG chewable tablet Chew 600 mg by mouth.    [provider]  Cholecalciferol 1.25 MG (50000 UT) capsule once a week. Patient not taking: Reported on 08/19/2021 06/25/19   [provider]  denosumab (PROLIA) 60 MG/ML SOSY injection Inject into the skin. 10/11/21   [provider]  estradiol (ESTRACE) 0.1 MG/GM vaginal cream Insert pea size amount daily x 2 weeks then every other day x 2 weeks then 2-3 times weekly for maintenance 02/20/19   [provider]  Ferrous Fumarate (HEMOCYTE - 106 MG FE) 324 (106 Fe) MG TABS tablet Take by mouth.    [provider]  Misc. Devices (WALKER) MISC 1 Device by Does not apply route as directed. 10/05/17   Dionne BucySiadecki, Sebastian, MD  PROLIA 60 MG/ML SOSY injection Inject into the skin. 03/09/21   [provider]   DG Chest 1 View  Result Date: 08/19/2021 CLINICAL DATA:  Fall. EXAM: CHEST  1 VIEW COMPARISON:  October 05, 2017. FINDINGS: Stable cardiomediastinal silhouette. Large hiatal hernia is noted. Both lungs are clear. The visualized skeletal structures are unremarkable. IMPRESSION: No active disease. Electronically Signed   By: Lupita RaiderJames  Green Jr M.D.   On: 08/19/2021 14:27   DG Hip Unilat  With Pelvis 2-3 Views Right  Result Date: 08/19/2021 CLINICAL DATA:  Fall, right hip pain EXAM: DG HIP (WITH OR WITHOUT PELVIS) 3V RIGHT COMPARISON:  Radiograph dated October 05, 2017. FINDINGS: Displaced right femoral neck fracture. Unchanged calcification located superior to the right greater  trochanter. Degenerative changes of the right hip joint. Contrast material noted in the bowel. Soft tissues otherwise unremarkable. IMPRESSION: Displaced right femoral neck fracture. Electronically Signed   By: Allegra LaiLeah  Strickland M.D.   On: 08/19/2021 14:28    Positive ROS: All other systems have been reviewed and were otherwise negative with the exception of those mentioned in the HPI and as above.  Physical Exam: General: Alert, no acute distress  MUSCULOSKELETAL: Right lower extremity: Patient's skin is intact.  There is no erythema ecchymosis or swelling of the left thigh or lower leg.  She has shortening and external rotation of the right lower extremity.  Patient has palpable pedal pulses.  She has significant toenail overgrowth.  Her foot is well-perfused.  She has intact sensation light touch intact motor function distally.  Assessment: Right displaced femoral neck Fracture  Plan: I explained to the patient  that she has fractured her right hip.  I recommended a right hip hemiarthroplasty as treatment for this fracture.  I explained to her the details of the operation as well as the postoperative course.   Patient has been scheduled for surgery for 1 PM tomorrow.  Patient will be n.p.o. after midnight.  She is being admitted to the hospitalist service for preoperative clearance.  I personally reviewed the patient's x-rays in preparation for this case.  I have also reviewed her labs.  Patient has not hemoglobin of 14.2.  Her potassium is 3.3 and creatinine is 1.26.  Her INR is 1.0.  PTT is 13.3.     Juanell Fairly, MD    08/19/2021 4:55 PM

## 2021-08-19 NOTE — ED Notes (Signed)
Hospitalist at bedside 

## 2021-08-19 NOTE — ED Triage Notes (Signed)
Pt arrives via EMS from home after having a fall when she was trying to get in the house from shopping- pt having R hip pain- pt denies hitting her head- pt denies blood thinner use- pt was outside for 45 minutes before EMS picked her up

## 2021-08-19 NOTE — ED Notes (Signed)
Dr. Krasinski at bedside. 

## 2021-08-19 NOTE — H&P (Signed)
History and Physical    Rochel S [name] NIO:[id number] DOB: 1945-09-19 DOA: 08/19/2021  PCP: Margaretann Loveless, MD (Confirm with patient/family/NH records and if not entered, this has to be entered at [facility] point of entry) Patient coming from: Home  I have personally briefly reviewed patient's old medical records in [facility]  Chief Complaint: I fell and hit my leg  HPI: [name] [name] is a 76 y.o. female with medical history significant of  osteoporosis, autoimmune hepatitis, HTN, GERD, chronic iron deficiency anemia, chronic ambulation dysfunction, presented with mechanical fall and right hip fracture.  At baseline uses cane to ambulate, this afternoon, patient tumbled about walking outside the house, and fell on the right hip.  Denied any loss of consciousness, denied any lightheaded or palpitation before the fall.  Unable to get up herself because of severe pain of the right hip, patient stated on the floor for 45 minutes.  At baseline, she is active, able to walk every day 20 to 30 minutes without shortness of breath, she lives in a 1 floor house.  ED Course: No tachycardia no hypotension.  X-ray showed displaced right hip fracture.  Hemoglobin 14.2, WBC 11.3.  Review of Systems: As per HPI otherwise 14 point review of systems negative.    Past Medical History:  Diagnosis Date   Abnormal LFTs (liver function tests)    Autoimmune hepatitis (HCC) 2018   Autoimmune hepatitis (HCC)    Autoimmune hepatitis treated with steroids (HCC)    DOE (dyspnea on exertion)    GERD (gastroesophageal reflux disease)    Hepatitis    Hyperlipidemia    Hypertension    Iron deficiency anemia due to chronic blood loss 05/27/2021   Osteoporosis     Past Surgical History:  Procedure Laterality Date   ABDOMINAL HYSTERECTOMY     COLONOSCOPY  10/06/2015   COLONOSCOPY WITH PROPOFOL N/A 08/27/2018   Procedure: COLONOSCOPY WITH PROPOFOL;  Surgeon: Scot Jun, MD;  Location: [facility] ENDOSCOPY;   Service: Endoscopy;  Laterality: N/A;   ESOPHAGOGASTRODUODENOSCOPY (EGD) WITH PROPOFOL N/A 08/27/2018   Procedure: ESOPHAGOGASTRODUODENOSCOPY (EGD) WITH PROPOFOL;  Surgeon: Scot Jun, MD;  Location: [facility] ENDOSCOPY;  Service: Endoscopy;  Laterality: N/A;   ESOPHAGOGASTRODUODENOSCOPY (EGD) WITH PROPOFOL N/A 05/21/2021   Procedure: ESOPHAGOGASTRODUODENOSCOPY (EGD) WITH PROPOFOL;  Surgeon: Regis Bill, MD;  Location: [facility] ENDOSCOPY;  Service: Endoscopy;  Laterality: N/A;   LIVER BIOPSY       reports that she has never smoked. She has never used smokeless tobacco. She reports that she does not drink alcohol and does not use drugs.  Allergies  Allergen Reactions   Sulfa Antibiotics Nausea And Vomiting    Pt has not had a sulfa drug in years and is not certain of reaction but suspects nausea and vomiting.   Sulfasalazine Nausea And Vomiting    Family History  Problem Relation Age of Onset   Cancer Mother      Prior to Admission medications   Medication Sig Start Date End Date Taking? Authorizing Provider  amLODipine (NORVASC) 5 MG tablet Take 5 mg by mouth daily. 10/03/17  Yes [provider]  aspirin EC 81 MG tablet Take 81 mg by mouth daily.    Yes [provider]  azaTHIOprine (IMURAN) 50 MG tablet Take 25 mg by mouth daily. 09/22/17  Yes [provider]  calcium citrate-vitamin D (CITRACAL+D) 315-200 MG-UNIT tablet Take 1 tablet by mouth 2 (two) times daily.   Yes [provider]  cyanocobalamin 100 MCG tablet Take 100 mcg by mouth daily.   Yes [provider]  Ferrous Fumarate (HEMOCYTE - 106 MG FE) 324 (106 Fe) MG TABS tablet Take 1 tablet by mouth.   Yes [provider]  metoprolol tartrate (LOPRESSOR) 50 MG tablet Take 50 mg by mouth daily.   Yes [provider]  omeprazole (PRILOSEC) 40 MG capsule Take 40 mg by mouth daily. 08/11/20  Yes [provider]  potassium chloride (KLOR-CON) 10 MEQ tablet  Take 10 mEq by mouth daily. 10/03/19 09/18/21 Yes [provider]  predniSONE (DELTASONE) 5 MG tablet Take 7.5 mg by mouth daily. 09/17/17  Yes [provider]  alendronate (FOSAMAX) 70 MG tablet Take 70 mg by mouth once a week. Take with a full glass of water on an empty stomach. Patient not taking: Reported on 05/21/2021    [provider]  benazepril (LOTENSIN) 40 MG tablet Take 40 mg by mouth daily.  Patient not taking: Reported on 05/21/2021    [provider]  Calcium Carbonate Antacid 600 MG chewable tablet Chew 600 mg by mouth.    [provider]  Cholecalciferol 1.25 MG (50000 UT) capsule once a week. Patient not taking: Reported on 08/19/2021 06/25/19   [provider]  denosumab (PROLIA) 60 MG/ML SOSY injection Inject into the skin. 10/11/21   [provider]  estradiol (ESTRACE) 0.1 MG/GM vaginal cream Insert pea size amount daily x 2 weeks then every other day x 2 weeks then 2-3 times weekly for maintenance 02/20/19   [provider]  Ferrous Fumarate (HEMOCYTE - 106 MG FE) 324 (106 Fe) MG TABS tablet Take by mouth.    [provider]  Misc. Devices (WALKER) MISC 1 Device by Does not apply route as directed. 10/05/17   Dionne Bucy, MD  PROLIA 60 MG/ML SOSY injection Inject into the skin. 03/09/21   [provider]    Physical Exam: Vitals:   08/19/21 1500 08/19/21 1530 08/19/21 1600 08/19/21 1630  BP: (!) 148/79 (!) 154/83 (!) 157/85 (!) 154/77  Pulse: 72 68 75 76  Resp: 18 19 20 20   Temp:      TempSrc:      SpO2: 94% 93% 92% 95%  Weight:      Height:        Constitutional: NAD, calm, comfortable Vitals:   08/19/21 1500 08/19/21 1530 08/19/21 1600 08/19/21 1630  BP: (!) 148/79 (!) 154/83 (!) 157/85 (!) 154/77  Pulse: 72 68 75 76  Resp: 18 19 20 20   Temp:      TempSrc:      SpO2: 94% 93% 92% 95%  Weight:      Height:       Eyes: PERRL, lids and conjunctivae normal ENMT: Mucous  membranes are moist. Posterior pharynx clear of any exudate or lesions.Normal dentition.  Neck: normal, supple, no masses, no thyromegaly Respiratory: clear to auscultation bilaterally, no wheezing, no crackles. Normal respiratory effort. No accessory muscle use.  Cardiovascular: Regular rate and rhythm, no murmurs / rubs / gallops. No extremity edema. 2+ pedal pulses. No carotid bruits.  Abdomen: no tenderness, no masses palpated. No hepatosplenomegaly. Bowel sounds positive.  Musculoskeletal: Right leg shortened and laterally rotated Neurologic: CN 2-12 grossly intact. Sensation intact, DTR normal. Strength 5/5 in all 4.  Psychiatric: Normal judgment and insight. Alert and oriented x 3. Normal mood.     Labs on Admission: I have personally reviewed following labs and imaging studies  CBC:  Recent Labs  Lab 08/19/21 1339  WBC 11.3*  NEUTROABS 9.0*  HGB 14.2  HCT 46.2*  MCV 89.7  PLT 167   Basic Metabolic Panel: No results for input(s): NA, K, CL, CO2, GLUCOSE, BUN, CREATININE, CALCIUM, MG, PHOS in the last 168 hours. GFR: CrCl cannot be calculated (Patient's most recent lab result is older than the maximum 21 days allowed.). Liver Function Tests: No results for input(s): AST, ALT, ALKPHOS, BILITOT, PROT, ALBUMIN in the last 168 hours. No results for input(s): LIPASE, AMYLASE in the last 168 hours. No results for input(s): AMMONIA in the last 168 hours. Coagulation Profile: Recent Labs  Lab 08/19/21 1339  INR 1.0   Cardiac Enzymes: No results for input(s): CKTOTAL, CKMB, CKMBINDEX, TROPONINI in the last 168 hours. BNP (last 3 results) No results for input(s): PROBNP in the last 8760 hours. HbA1C: No results for input(s): HGBA1C in the last 72 hours. CBG: No results for input(s): GLUCAP in the last 168 hours. Lipid Profile: No results for input(s): CHOL, HDL, LDLCALC, TRIG, CHOLHDL, LDLDIRECT in the last 72 hours. Thyroid Function Tests: No results for input(s): TSH,  T4TOTAL, FREET4, T3FREE, THYROIDAB in the last 72 hours. Anemia Panel: No results for input(s): VITAMINB12, FOLATE, FERRITIN, TIBC, IRON, RETICCTPCT in the last 72 hours. Urine analysis:    Component Value Date/Time   COLORURINE AMBER (A) 02/20/2017 1842   APPEARANCEUR Cloudy (A) 02/27/2019 1102   LABSPEC 1.024 02/20/2017 1842   PHURINE 5.0 02/20/2017 1842   GLUCOSEU Negative 02/27/2019 1102   HGBUR NEGATIVE 02/20/2017 1842   BILIRUBINUR Negative 02/27/2019 1102   KETONESUR NEGATIVE 02/20/2017 1842   PROTEINUR 2+ (A) 02/27/2019 1102   PROTEINUR NEGATIVE 02/20/2017 1842   NITRITE Negative 02/27/2019 1102   NITRITE NEGATIVE 02/20/2017 1842   LEUKOCYTESUR 3+ (A) 02/27/2019 1102    Radiological Exams on Admission: DG Chest 1 View  Result Date: 08/19/2021 CLINICAL DATA:  Fall. EXAM: CHEST  1 VIEW COMPARISON:  October 05, 2017. FINDINGS: Stable cardiomediastinal silhouette. Large hiatal hernia is noted. Both lungs are clear. The visualized skeletal structures are unremarkable. IMPRESSION: No active disease. Electronically Signed   By: Lupita RaiderJames  Green Jr M.D.   On: 08/19/2021 14:27   DG Hip Unilat  With Pelvis 2-3 Views Right  Result Date: 08/19/2021 CLINICAL DATA:  Fall, right hip pain EXAM: DG HIP (WITH OR WITHOUT PELVIS) 3V RIGHT COMPARISON:  Radiograph dated October 05, 2017. FINDINGS: Displaced right femoral neck fracture. Unchanged calcification located superior to the right greater trochanter. Degenerative changes of the right hip joint. Contrast material noted in the bowel. Soft tissues otherwise unremarkable. IMPRESSION: Displaced right femoral neck fracture. Electronically Signed   By: Allegra LaiLeah  Strickland M.D.   On: 08/19/2021 14:28    EKG: Poor quality baseline, will repeat.  Assessment/Plan Principal Problem:   Hip fracture (HCC) Active Problems:   Autoimmune hepatitis (HCC)  (please populate well all problems here in Problem List. (For example, if patient is on BP meds at home and you  resume or decide to hold them, it is a problem that needs to be her. Same for CAD, COPD, HLD and so on)  Right hip closed fracture secondary to mechanical fall -Pain control -Lovenox for DVT prophylaxis -N.p.o. after midnight, ORIF tomorrow.  Discussed with orthopedic surgery attending at bedside. -Baseline healthy, no history of diabetes, no history of heart attack or stroke, able to tolerate 4 METs activity, and tolerated recently generalized anesthesia during his EGD procedure, medically cleared for tomorrow's ORIF  with generalized anesthesia with acceptable risk.  HTN -Stable, continue amlodipine and beta-blocker  Chronic iron deficiency anemia -H&H stable, continue iron supplement  Chronic autoimmune hepatitis -Continue Imuran -Given acute distress of hip fracture incoming surgery, will do double dose of her current prednisone dose from 7.5 mg daily to 15 mg daily.  Osteoporosis -Continue calcium supplement and outpatient Prolia injection.  DVT prophylaxis: Lovenox Code Status: Full code Family Communication: None at bedside Disposition Plan: Expect more than 2 midnight hospital stay for ORIF, expect discharge to rehab Consults called: Orthopedic surgery Admission status: MedSurg admission   Emeline GeneralPing T Lizzete Gough MD [facility] Pager [phone]  08/19/2021, 4:49 PM

## 2021-08-19 NOTE — ED Provider Notes (Signed)
[name] Provider Note    Event Date/Time   First MD Initiated Contact with Patient 08/19/21 1343     (approximate)   [name]   Chief Complaint Fall   HPI [name] [name] is a 76 y.o. [name], [name] [name], [name] [name], [name] [name], [name] [name] landing on her right side.  She states that she did not have any preceding symptoms, such as lightheadedness, chest pain, or shortness of breath.  When she [name], she denies hitting her head.  She is currently endorsing pain in her right hip, otherwise no pain.  Denies blood thinner use.  Denies fever/chills, chest pain, shortness of breath, abdominal pain, back pain, neck pain, or numbness/tingling in upper or lower extremities.  [name] Limitations: No limitations.      Physical Exam  Triage Vital Signs: ED Triage Vitals  Enc Vitals Group     BP 08/19/21 1336 (!) 146/72     Pulse Rate 08/19/21 1336 68     Resp 08/19/21 1336 20     Temp 08/19/21 1336 97.7 F (36.5 C)     Temp Source 08/19/21 1336 Oral     SpO2 08/19/21 1336 94 %     Weight 08/19/21 1338 127 lb (57.6 kg)     Height 08/19/21 1338 5\' 3"  (1.6 m)     Head Circumference --      Peak Flow --      Pain Score 08/19/21 1331 8     Pain Loc --      Pain Edu? --      Excl. in GC? --     Most recent vital signs: Vitals:   08/19/21 1500 08/19/21 1530  BP: (!) 148/79 (!) 154/83  Pulse: 72 68  Resp: 18 19  Temp:    SpO2: 94% 93%    General: Awake, NAD.  CV: Good peripheral perfusion.  Resp: Normal effort.  Abd: Soft, non-tender. No distention.  Neuro: At baseline. No gross neurological deficits. Other: Right lower extremity is shortened and externally rotated.  Pulse, motor, sensation intact distally.  No tenderness in the femoral, patellar, or tibial/fibular region.   Cap refill less than 2 seconds.   Physical Exam    ED Results / Procedures / Treatments  Labs (all labs ordered are listed, but only abnormal results are displayed) Labs Reviewed  CBC WITH DIFFERENTIAL/PLATELET - Abnormal; Notable for the following components:      Result Value   WBC 11.3 (*)    RBC 5.15 (*)    HCT 46.2 (*)    RDW 19.1 (*)    Neutro Abs 9.0 (*)    All other components within normal limits  RESP PANEL BY RT-PCR (FLU A&B, COVID) [id number]  PROTIME-INR  COMPREHENSIVE METABOLIC PANEL     EKG None.   RADIOLOGY  ED Provider Interpretation: I personally reviewed and interpreted these images.  Chest x-ray negative.  Hip x-ray shows displaced right femoral neck fracture.  DG Chest 1 View  Result Date: 08/19/2021 CLINICAL DATA:  Fall. EXAM: CHEST  1 VIEW COMPARISON:  October 05, 2017. FINDINGS: Stable cardiomediastinal silhouette. Large hiatal hernia is noted. Both lungs are clear. The visualized skeletal structures are unremarkable. IMPRESSION: No active disease. Electronically Signed   By: October 07, 2017 M.D.   On: 08/19/2021 14:27   DG  Hip Unilat  With Pelvis 2-3 Views Right  Result Date: 08/19/2021 CLINICAL DATA:  Fall, right hip pain EXAM: DG HIP (WITH OR WITHOUT PELVIS) 3V RIGHT COMPARISON:  Radiograph dated October 05, 2017. FINDINGS: Displaced right femoral neck fracture. Unchanged calcification located superior to the right greater trochanter. Degenerative changes of the right hip joint. Contrast material noted in the bowel. Soft tissues otherwise unremarkable. IMPRESSION: Displaced right femoral neck fracture. Electronically Signed   By: Allegra Lai M.D.   On: 08/19/2021 14:28    PROCEDURES:  Critical Care performed: None.  Procedures    MEDICATIONS ORDERED IN ED: Medications  fentaNYL (SUBLIMAZE) injection 25 mcg (25 mcg Intravenous Given 08/19/21 1440)     IMPRESSION / MDM / ASSESSMENT AND PLAN / ED COURSE  I reviewed the triage vital signs and  the nursing notes.                              [name] [name] is a 76 y.o. [name], [name] [name], [name] [name], [name] [name], [name] [name] landing on her right side.  She states that she did not have any preceding symptoms, such as lightheadedness, chest pain, or shortness of breath.  When she [name], she denies hitting her head.  She is currently endorsing pain in her right hip, otherwise no pain.  Differential diagnosis includes, but is not limited to, contusion, hip fracture, femoral fracture, patellar fracture.   ED Course Patient appears well, but uncomfortable.  She states her pain is 8/10.  We will go ahead and treat with 25 mcg fentanyl.  Vital signs within normal limits.  CBC notable for leukocytosis at 11.3.  Otherwise no anemia.  Pro time INR normal.  X-ray shows displaced right femoral neck fracture.  Will consult orthopedic surgery.   Assessment/Plan Imaging shows patient has a displaced right femoral neck fracture.  She is neurovascularly intact.  She is not on any blood thinners.  No [name] or physical exam findings concerning for head or neck injury.  Consulted with Dr. Martha Clan, the on-call orthopedic surgeon, who stated that he will come see the patient after she was admitted.  Spoke with the hospitalist, who agreed to admission.       FINAL CLINICAL IMPRESSION(S) / ED DIAGNOSES   Final diagnoses:  Hip fracture (HCC)     Rx / DC Orders   ED Discharge Orders     None        Note:  This document was prepared using Dragon voice recognition software and may include unintentional dictation errors.   Varney Daily, [place] 08/19/21 1605    Sharyn Creamer, MD 08/23/21 [phone]

## 2021-08-20 ENCOUNTER — Encounter
Admission: EM | Disposition: A | Discharge status: Skilled Nursing Facility | Payer: Self-pay | Source: Home / Self Care | Attending: Internal Medicine

## 2021-08-20 ENCOUNTER — Inpatient Hospital Stay: Payer: Medicare PPO | Admitting: Anesthesiology

## 2021-08-20 ENCOUNTER — Inpatient Hospital Stay: Payer: Medicare PPO

## 2021-08-20 ENCOUNTER — Other Ambulatory Visit: Payer: Self-pay

## 2021-08-20 ENCOUNTER — Encounter: Payer: Self-pay | Admitting: Internal Medicine

## 2021-08-20 DIAGNOSIS — I10 Essential (primary) hypertension: Secondary | ICD-10-CM | POA: Diagnosis not present

## 2021-08-20 DIAGNOSIS — R110 Nausea: Secondary | ICD-10-CM | POA: Diagnosis not present

## 2021-08-20 DIAGNOSIS — N183 Chronic kidney disease, stage 3 unspecified: Secondary | ICD-10-CM

## 2021-08-20 DIAGNOSIS — S72001S Fracture of unspecified part of neck of right femur, sequela: Secondary | ICD-10-CM

## 2021-08-20 DIAGNOSIS — K219 Gastro-esophageal reflux disease without esophagitis: Secondary | ICD-10-CM

## 2021-08-20 DIAGNOSIS — K754 Autoimmune hepatitis: Secondary | ICD-10-CM | POA: Diagnosis not present

## 2021-08-20 DIAGNOSIS — N1831 Chronic kidney disease, stage 3a: Secondary | ICD-10-CM | POA: Diagnosis not present

## 2021-08-20 HISTORY — PX: HIP ARTHROPLASTY: SHX981

## 2021-08-20 LAB — CBC
HCT: 41.3 % (ref 36.0–46.0)
Hemoglobin: 12.9 g/dL (ref 12.0–15.0)
MCH: 26.9 pg (ref 26.0–34.0)
MCHC: 31.2 g/dL (ref 30.0–36.0)
MCV: 86.2 fL (ref 80.0–100.0)
Platelets: 170 10*3/uL (ref 150–400)
RBC: 4.79 MIL/uL (ref 3.87–5.11)
RDW: 18.5 % — ABNORMAL HIGH (ref 11.5–15.5)
WBC: 8.7 10*3/uL (ref 4.0–10.5)
nRBC: 0 % (ref 0.0–0.2)

## 2021-08-20 LAB — SURGICAL PCR SCREEN
MRSA, PCR: NEGATIVE
Staphylococcus aureus: NEGATIVE

## 2021-08-20 SURGERY — HEMIARTHROPLASTY, HIP, DIRECT ANTERIOR APPROACH, FOR FRACTURE
Anesthesia: Spinal | Site: Hip | Laterality: Right

## 2021-08-20 MED ORDER — PHENYLEPHRINE HCL-NACL 20-0.9 MG/250ML-% IV SOLN
INTRAVENOUS | Status: DC | PRN
  Administered 2021-08-20: 30 ug/min via INTRAVENOUS

## 2021-08-20 MED ORDER — DOCUSATE SODIUM 100 MG PO CAPS
100.0000 mg | ORAL_CAPSULE | Freq: Two times a day (BID) | ORAL | Status: DC
  Administered 2021-08-20 – 2021-08-24 (×9): 100 mg via ORAL
  Filled 2021-08-20 (×9): qty 1

## 2021-08-20 MED ORDER — ACETAMINOPHEN 325 MG PO TABS: 325.0000 mg | ORAL_TABLET | Freq: Four times a day (QID) | ORAL | Status: DC | PRN

## 2021-08-20 MED ORDER — MENTHOL 3 MG MT LOZG
1.0000 | LOZENGE | OROMUCOSAL | Status: DC | PRN
  Filled 2021-08-20: qty 9

## 2021-08-20 MED ORDER — CEFAZOLIN SODIUM-DEXTROSE 2-4 GM/100ML-% IV SOLN
2.0000 g | Freq: Four times a day (QID) | INTRAVENOUS | Status: AC
  Administered 2021-08-20 – 2021-08-21 (×2): 2 g via INTRAVENOUS
  Filled 2021-08-20 (×2): qty 100

## 2021-08-20 MED ORDER — PROPOFOL 10 MG/ML IV BOLUS
INTRAVENOUS | Status: DC | PRN
  Administered 2021-08-20: 50 mg via INTRAVENOUS
  Administered 2021-08-20: 30 mg via INTRAVENOUS
  Administered 2021-08-20: 20 mg via INTRAVENOUS
  Administered 2021-08-20: 30 mg via INTRAVENOUS

## 2021-08-20 MED ORDER — METHYLPREDNISOLONE SODIUM SUCC 40 MG IJ SOLR
40.0000 mg | Freq: Once | INTRAMUSCULAR | Status: AC
Start: 2021-08-20 — End: 2021-08-20
  Administered 2021-08-20: 40 mg via INTRAVENOUS
  Filled 2021-08-20: qty 1

## 2021-08-20 MED ORDER — CHLORHEXIDINE GLUCONATE CLOTH 2 % EX PADS
6.0000 | MEDICATED_PAD | Freq: Every day | CUTANEOUS | Status: DC
  Administered 2021-08-20 – 2021-08-24 (×4): 6 via TOPICAL

## 2021-08-20 MED ORDER — PROMETHAZINE HCL 25 MG/ML IJ SOLN
6.2500 mg | Freq: Once | INTRAMUSCULAR | Status: AC
  Administered 2021-08-20: 6.25 mg via INTRAVENOUS

## 2021-08-20 MED ORDER — ALUM & MAG HYDROXIDE-SIMETH 200-200-20 MG/5ML PO SUSP
30.0000 mL | ORAL | Status: DC | PRN
Start: 2021-08-20 — End: 2021-08-24

## 2021-08-20 MED ORDER — TRANEXAMIC ACID-NACL 1000-0.7 MG/100ML-% IV SOLN
INTRAVENOUS | Status: AC
  Filled 2021-08-20: qty 100

## 2021-08-20 MED ORDER — CEFAZOLIN SODIUM-DEXTROSE 2-4 GM/100ML-% IV SOLN: 2.0000 g | Freq: Four times a day (QID) | INTRAVENOUS | Status: DC

## 2021-08-20 MED ORDER — ENOXAPARIN SODIUM 30 MG/0.3ML IJ SOSY
30.0000 mg | PREFILLED_SYRINGE | INTRAMUSCULAR | Status: DC
  Administered 2021-08-21 – 2021-08-24 (×4): 30 mg via SUBCUTANEOUS
  Filled 2021-08-20 (×4): qty 0.3

## 2021-08-20 MED ORDER — LACTATED RINGERS IV SOLN: INTRAVENOUS | Status: DC | PRN

## 2021-08-20 MED ORDER — ONDANSETRON HCL 4 MG/2ML IJ SOLN
INTRAMUSCULAR | Status: AC
  Filled 2021-08-20: qty 2

## 2021-08-20 MED ORDER — ACETAMINOPHEN 10 MG/ML IV SOLN
INTRAVENOUS | Status: AC
  Filled 2021-08-20: qty 100

## 2021-08-20 MED ORDER — ONDANSETRON HCL 4 MG/2ML IJ SOLN
4.0000 mg | Freq: Four times a day (QID) | INTRAMUSCULAR | Status: DC
  Administered 2021-08-20: 4 mg via INTRAVENOUS
  Filled 2021-08-20: qty 2

## 2021-08-20 MED ORDER — DEXAMETHASONE SODIUM PHOSPHATE 10 MG/ML IJ SOLN
INTRAMUSCULAR | Status: DC | PRN
Start: 2021-08-20 — End: 2021-08-20
  Administered 2021-08-20: 5 mg via INTRAVENOUS

## 2021-08-20 MED ORDER — SUGAMMADEX SODIUM 200 MG/2ML IV SOLN
INTRAVENOUS | Status: DC | PRN
  Administered 2021-08-20: 200 mg via INTRAVENOUS

## 2021-08-20 MED ORDER — SENNA 8.6 MG PO TABS
1.0000 | ORAL_TABLET | Freq: Two times a day (BID) | ORAL | Status: DC
  Administered 2021-08-20 – 2021-08-24 (×9): 8.6 mg via ORAL
  Filled 2021-08-20 (×9): qty 1

## 2021-08-20 MED ORDER — PROPOFOL 500 MG/50ML IV EMUL
INTRAVENOUS | Status: DC | PRN
  Administered 2021-08-20: 50 ug/kg/min via INTRAVENOUS

## 2021-08-20 MED ORDER — TRANEXAMIC ACID-NACL 1000-0.7 MG/100ML-% IV SOLN
1000.0000 mg | Freq: Once | INTRAVENOUS | Status: AC
  Administered 2021-08-20: 1000 mg via INTRAVENOUS

## 2021-08-20 MED ORDER — METHOCARBAMOL 500 MG PO TABS: 500.0000 mg | ORAL_TABLET | Freq: Four times a day (QID) | ORAL | Status: DC | PRN

## 2021-08-20 MED ORDER — PHENYLEPHRINE 40 MCG/ML (10ML) SYRINGE FOR IV PUSH (FOR BLOOD PRESSURE SUPPORT)
PREFILLED_SYRINGE | INTRAVENOUS | Status: DC | PRN
  Administered 2021-08-20: 80 ug via INTRAVENOUS

## 2021-08-20 MED ORDER — SUCCINYLCHOLINE CHLORIDE 200 MG/10ML IV SOSY
PREFILLED_SYRINGE | INTRAVENOUS | Status: DC | PRN
  Administered 2021-08-20: 100 mg via INTRAVENOUS

## 2021-08-20 MED ORDER — SODIUM CHLORIDE 0.9 % IR SOLN
Status: DC | PRN
  Administered 2021-08-20: 1004 mL

## 2021-08-20 MED ORDER — POLYETHYLENE GLYCOL 3350 17 G PO PACK
17.0000 g | PACK | Freq: Every day | ORAL | Status: DC | PRN
  Administered 2021-08-21: 17 g via ORAL
  Filled 2021-08-20: qty 1

## 2021-08-20 MED ORDER — MORPHINE SULFATE (PF) 2 MG/ML IV SOLN: 0.5000 mg | INTRAVENOUS | Status: DC | PRN

## 2021-08-20 MED ORDER — TRAMADOL HCL 50 MG PO TABS
50.0000 mg | ORAL_TABLET | Freq: Four times a day (QID) | ORAL | Status: DC
  Administered 2021-08-20 – 2021-08-24 (×16): 50 mg via ORAL
  Filled 2021-08-20 (×16): qty 1

## 2021-08-20 MED ORDER — HYDROCODONE-ACETAMINOPHEN 5-325 MG PO TABS
1.0000 | ORAL_TABLET | ORAL | Status: DC | PRN
  Administered 2021-08-21: 2 via ORAL
  Administered 2021-08-22 – 2021-08-24 (×3): 1 via ORAL
  Filled 2021-08-20: qty 1
  Filled 2021-08-20: qty 2
  Filled 2021-08-20 (×2): qty 1

## 2021-08-20 MED ORDER — ACETAMINOPHEN 10 MG/ML IV SOLN
INTRAVENOUS | Status: DC | PRN
  Administered 2021-08-20: 1000 mg via INTRAVENOUS

## 2021-08-20 MED ORDER — ROCURONIUM BROMIDE 10 MG/ML (PF) SYRINGE
PREFILLED_SYRINGE | INTRAVENOUS | Status: AC
  Filled 2021-08-20: qty 10

## 2021-08-20 MED ORDER — CEFAZOLIN SODIUM-DEXTROSE 2-4 GM/100ML-% IV SOLN
INTRAVENOUS | Status: AC
  Filled 2021-08-20: qty 100

## 2021-08-20 MED ORDER — ONDANSETRON HCL 4 MG/2ML IJ SOLN: 4.0000 mg | Freq: Four times a day (QID) | INTRAMUSCULAR | Status: DC | PRN

## 2021-08-20 MED ORDER — NEOMYCIN-POLYMYXIN B GU 40-200000 IR SOLN
Status: AC
  Filled 2021-08-20: qty 20

## 2021-08-20 MED ORDER — BUPIVACAINE HCL (PF) 0.5 % IJ SOLN
INTRAMUSCULAR | Status: DC | PRN
  Administered 2021-08-20: 3 mL

## 2021-08-20 MED ORDER — ONDANSETRON HCL 4 MG PO TABS
4.0000 mg | ORAL_TABLET | Freq: Four times a day (QID) | ORAL | Status: DC | PRN
  Administered 2021-08-21: 4 mg via ORAL
  Filled 2021-08-20: qty 1

## 2021-08-20 MED ORDER — PREDNISONE 10 MG PO TABS
15.0000 mg | ORAL_TABLET | Freq: Every day | ORAL | Status: DC
  Administered 2021-08-21 – 2021-08-22 (×2): 15 mg via ORAL
  Filled 2021-08-20 (×2): qty 2

## 2021-08-20 MED ORDER — FENTANYL CITRATE (PF) 100 MCG/2ML IJ SOLN
INTRAMUSCULAR | Status: AC
  Filled 2021-08-20: qty 2

## 2021-08-20 MED ORDER — FENTANYL CITRATE (PF) 100 MCG/2ML IJ SOLN
INTRAMUSCULAR | Status: DC | PRN
  Administered 2021-08-20: 50 ug via INTRAVENOUS

## 2021-08-20 MED ORDER — ONDANSETRON HCL 4 MG/2ML IJ SOLN
4.0000 mg | Freq: Once | INTRAMUSCULAR | Status: AC
  Administered 2021-08-20: 4 mg via INTRAVENOUS

## 2021-08-20 MED ORDER — METHOCARBAMOL 1000 MG/10ML IJ SOLN
500.0000 mg | Freq: Four times a day (QID) | INTRAVENOUS | Status: DC | PRN
  Filled 2021-08-20: qty 5

## 2021-08-20 MED ORDER — BUPIVACAINE HCL (PF) 0.5 % IJ SOLN
INTRAMUSCULAR | Status: DC | PRN
  Administered 2021-08-20: 30 mL

## 2021-08-20 MED ORDER — ACETAMINOPHEN 500 MG PO TABS
500.0000 mg | ORAL_TABLET | Freq: Four times a day (QID) | ORAL | Status: AC
  Administered 2021-08-20 – 2021-08-21 (×4): 500 mg via ORAL
  Filled 2021-08-20 (×4): qty 1

## 2021-08-20 MED ORDER — PHENOL 1.4 % MT LIQD
1.0000 | OROMUCOSAL | Status: DC | PRN
  Filled 2021-08-20: qty 177

## 2021-08-20 MED ORDER — PROMETHAZINE HCL 25 MG/ML IJ SOLN
INTRAMUSCULAR | Status: AC
  Filled 2021-08-20: qty 1

## 2021-08-20 MED ORDER — PROPOFOL 10 MG/ML IV BOLUS
INTRAVENOUS | Status: AC
  Filled 2021-08-20: qty 20

## 2021-08-20 MED ORDER — PROPOFOL 500 MG/50ML IV EMUL
INTRAVENOUS | Status: AC
  Filled 2021-08-20: qty 50

## 2021-08-20 MED ORDER — DEXAMETHASONE SODIUM PHOSPHATE 10 MG/ML IJ SOLN
INTRAMUSCULAR | Status: AC
  Filled 2021-08-20: qty 1

## 2021-08-20 MED ORDER — ENOXAPARIN SODIUM 30 MG/0.3ML IJ SOSY: 30.0000 mg | PREFILLED_SYRINGE | INTRAMUSCULAR | Status: DC

## 2021-08-20 MED ORDER — SODIUM CHLORIDE 0.9 % IR SOLN
Status: DC | PRN
  Administered 2021-08-20: 3012 mL

## 2021-08-20 MED ORDER — ROCURONIUM BROMIDE 100 MG/10ML IV SOLN
INTRAVENOUS | Status: DC | PRN
  Administered 2021-08-20: 30 mg via INTRAVENOUS

## 2021-08-20 MED ORDER — ADULT MULTIVITAMIN W/MINERALS CH
1.0000 | ORAL_TABLET | Freq: Every day | ORAL | Status: DC
  Administered 2021-08-21 – 2021-08-24 (×4): 1 via ORAL
  Filled 2021-08-20 (×5): qty 1

## 2021-08-20 MED ORDER — BUPIVACAINE HCL (PF) 0.5 % IJ SOLN
INTRAMUSCULAR | Status: AC
  Filled 2021-08-20: qty 30

## 2021-08-20 MED ORDER — ENSURE ENLIVE PO LIQD
237.0000 mL | Freq: Two times a day (BID) | ORAL | Status: DC
  Administered 2021-08-21 – 2021-08-24 (×7): 237 mL via ORAL
  Filled 2021-08-20: qty 237

## 2021-08-20 MED ORDER — ONDANSETRON HCL 4 MG/2ML IJ SOLN
INTRAMUSCULAR | Status: DC | PRN
  Administered 2021-08-20: 4 mg via INTRAVENOUS

## 2021-08-20 SURGICAL SUPPLY — 57 items
BLADE SAGITTAL WIDE XTHICK NO (BLADE) ×2 IMPLANT
BLADE SURG SZ10 CARB STEEL (BLADE) ×2 IMPLANT
BNDG COHESIVE 4X5 TAN ST LF (GAUZE/BANDAGES/DRESSINGS) ×2 IMPLANT
CLEANER CAUTERY TIP 5X5 PAD (MISCELLANEOUS) IMPLANT
COVER BACK TABLE REUSABLE LG (DRAPES) ×2 IMPLANT
COVER LIGHT HANDLE STERIS (MISCELLANEOUS) ×1 IMPLANT
DRAPE 3/4 80X56 (DRAPES) ×4 IMPLANT
DRAPE INCISE IOBAN 66X60 STRL (DRAPES) ×2 IMPLANT
DRAPE ORTHO SPLIT 77X108 STRL (DRAPES) ×4
DRAPE SURG 17X11 SM STRL (DRAPES) ×2 IMPLANT
DRAPE SURG ORHT 6 SPLT 77X108 (DRAPES) ×2 IMPLANT
DRSG AQUACEL AG ADV 3.5X10 (GAUZE/BANDAGES/DRESSINGS) ×1 IMPLANT
DURAPREP 26ML APPLICATOR (WOUND CARE) ×8 IMPLANT
ELECT CAUTERY BLADE 6.4 (BLADE) ×2 IMPLANT
ELECT REM PT RETURN 9FT ADLT (ELECTROSURGICAL) ×2
ELECTRODE REM PT RTRN 9FT ADLT (ELECTROSURGICAL) ×1 IMPLANT
GAUZE 4X4 16PLY ~~LOC~~+RFID DBL (SPONGE) ×2 IMPLANT
GAUZE SPONGE 4X4 12PLY STRL (GAUZE/BANDAGES/DRESSINGS) ×2 IMPLANT
GAUZE XEROFORM 1X8 LF (GAUZE/BANDAGES/DRESSINGS) ×4 IMPLANT
GLOVE SURG ORTHO LTX SZ9 (GLOVE) ×4 IMPLANT
GLOVE SURG UNDER POLY LF SZ9 (GLOVE) ×2 IMPLANT
GOWN STRL REUS TWL 2XL XL LVL4 (GOWN DISPOSABLE) ×2 IMPLANT
GOWN STRL REUS W/ TWL LRG LVL3 (GOWN DISPOSABLE) ×1 IMPLANT
GOWN STRL REUS W/TWL LRG LVL3 (GOWN DISPOSABLE) ×2
HEAD MODULAR ENDO (Orthopedic Implant) ×2 IMPLANT
HEAD UNPLR 48XMDLR STRL HIP (Orthopedic Implant) IMPLANT
HEMOVAC 400ML (MISCELLANEOUS) ×2
HOLSTER ELECTROSUGICAL PENCIL (MISCELLANEOUS) ×2 IMPLANT
IV NS IRRIG 3000ML ARTHROMATIC (IV SOLUTION) ×2 IMPLANT
KIT DRAIN HEMOVAC JP 7FR 400ML (MISCELLANEOUS) ×1 IMPLANT
KIT TURNOVER KIT A (KITS) ×2 IMPLANT
MANIFOLD NEPTUNE II (INSTRUMENTS) ×2 IMPLANT
NDL FILTER BLUNT 18X1 1/2 (NEEDLE) ×1 IMPLANT
NDL MAYO CATGUT SZ4 TPR NDL (NEEDLE) ×1 IMPLANT
NDL SAFETY ECLIPSE 18X1.5 (NEEDLE) ×1 IMPLANT
NEEDLE FILTER BLUNT 18X 1/2SAF (NEEDLE) ×1
NEEDLE FILTER BLUNT 18X1 1/2 (NEEDLE) ×1 IMPLANT
NEEDLE HYPO 18GX1.5 SHARP (NEEDLE) ×2
NEEDLE MAYO CATGUT SZ4 (NEEDLE) ×2 IMPLANT
NS IRRIG 1000ML POUR BTL (IV SOLUTION) ×2 IMPLANT
PACK HIP PROSTHESIS (MISCELLANEOUS) ×2 IMPLANT
PAD CLEANER CAUTERY TIP 5X5 (MISCELLANEOUS) ×1
PILLOW ABDUCTION FOAM SM (MISCELLANEOUS) ×2 IMPLANT
PULSAVAC PLUS IRRIG FAN TIP (DISPOSABLE) ×2
RETRIEVER SUT HEWSON (MISCELLANEOUS) ×1 IMPLANT
SLEEVE UNITRAX V40 (Orthopedic Implant) ×2 IMPLANT
SLEEVE UNITRAX V40 +4 (Orthopedic Implant) IMPLANT
SPONGE T-LAP 18X18 ~~LOC~~+RFID (SPONGE) ×8 IMPLANT
STAPLER SKIN PROX 35W (STAPLE) ×2 IMPLANT
STEM FEM ACCOLADE 38X102X30 S3 (Stem) ×1 IMPLANT
SUT TICRON 2-0 30IN 311381 (SUTURE) ×8 IMPLANT
SUT VIC AB 0 CT1 36 (SUTURE) ×3 IMPLANT
SUT VIC AB 2-0 CT2 27 (SUTURE) ×4 IMPLANT
SYR 10ML LL (SYRINGE) ×2 IMPLANT
TAPE MICROFOAM 4IN (TAPE) ×2 IMPLANT
TIP FAN IRRIG PULSAVAC PLUS (DISPOSABLE) ×1 IMPLANT
TUBE SUCT KAM VAC (TUBING) IMPLANT

## 2021-08-20 NOTE — Assessment & Plan Note (Addendum)
Continue metoprolol.  Discontinue Norvasc.

## 2021-08-20 NOTE — Progress Notes (Addendum)
Subjective:  The patient has been re-examined, and the chart reviewed, and there have been no interval changes to the documented orthopaedic consult note.      The risks, benefits, and alternatives have been discussed at length, and the patient is willing to proceed.    Objective:   VITALS:   Vitals:   08/20/21 0414 08/20/21 0736 08/20/21 0821 08/20/21 0824  BP: 124/76 133/85 (!) 139/96   Pulse: 77 72 72 72  Resp: 18 15 18    Temp: 97.6 F (36.4 C) 97.7 F (36.5 C) (!) 96.7 F (35.9 C)   TempSrc:  Oral Temporal   SpO2: 95% 94% 90% 93%  Weight:      Height:        PHYSICAL EXAM: Right lower extremity:  Neurovascular intact Sensation intact distally Intact pulses distally Dorsiflexion/Plantar flexion intact No cellulitis present Compartment soft  LABS  Results for orders placed or performed during the hospital encounter of 08/19/21 (from the past 24 hour(s))  CBC with Differential     Status: Abnormal   Collection Time: 08/19/21  1:39 PM  Result Value Ref Range   WBC 11.3 (H) 4.0 - 10.5 K/uL   RBC 5.15 (H) 3.87 - 5.11 MIL/uL   Hemoglobin 14.2 12.0 - 15.0 g/dL   HCT 10/17/21 (H) 94.8 - 54.6 %   MCV 89.7 80.0 - 100.0 fL   MCH 27.6 26.0 - 34.0 pg   MCHC 30.7 30.0 - 36.0 g/dL   RDW 27.0 (H) 35.0 - 09.3 %   Platelets 167 150 - 400 K/uL   nRBC 0.0 0.0 - 0.2 %   Neutrophils Relative % 79 %   Neutro Abs 9.0 (H) 1.7 - 7.7 K/uL   Lymphocytes Relative 12 %   Lymphs Abs 1.3 0.7 - 4.0 K/uL   Monocytes Relative 5 %   Monocytes Absolute 0.5 0.1 - 1.0 K/uL   Eosinophils Relative 3 %   Eosinophils Absolute 0.3 0.0 - 0.5 K/uL   Basophils Relative 0 %   Basophils Absolute 0.1 0.0 - 0.1 K/uL   Immature Granulocytes 1 %   Abs Immature Granulocytes 0.07 0.00 - 0.07 K/uL  Protime-INR     Status: None   Collection Time: 08/19/21  1:39 PM  Result Value Ref Range   Prothrombin Time 13.3 11.4 - 15.2 seconds   INR 1.0 0.8 - 1.2  Resp Panel by RT-PCR (Flu A&B, Covid) Nasopharyngeal  Swab     Status: None   Collection Time: 08/19/21  3:38 PM   Specimen: Nasopharyngeal Swab; Nasopharyngeal(NP) swabs in vial transport medium  Result Value Ref Range   SARS Coronavirus 2 by RT PCR NEGATIVE NEGATIVE   Influenza A by PCR NEGATIVE NEGATIVE   Influenza B by PCR NEGATIVE NEGATIVE  Magnesium     Status: None   Collection Time: 08/19/21  6:40 PM  Result Value Ref Range   Magnesium 1.8 1.7 - 2.4 mg/dL  Comprehensive metabolic panel     Status: Abnormal   Collection Time: 08/19/21  6:48 PM  Result Value Ref Range   Sodium 135 135 - 145 mmol/L   Potassium 3.6 3.5 - 5.1 mmol/L   Chloride 103 98 - 111 mmol/L   CO2 23 22 - 32 mmol/L   Glucose, Bld 141 (H) 70 - 99 mg/dL   BUN 18 8 - 23 mg/dL   Creatinine, Ser 10/17/21 (H) 0.44 - 1.00 mg/dL   Calcium 9.2 8.9 - 2.99 mg/dL   Total Protein 7.2 6.5 -  8.1 g/dL   Albumin 3.6 3.5 - 5.0 g/dL   AST 26 15 - 41 U/L   ALT 15 0 - 44 U/L   Alkaline Phosphatase 43 38 - 126 U/L   Total Bilirubin 0.7 0.3 - 1.2 mg/dL   GFR, Estimated 51 (L) >60 mL/min   Anion gap 9 5 - 15  CBC     Status: Abnormal   Collection Time: 08/20/21  4:01 AM  Result Value Ref Range   WBC 8.7 4.0 - 10.5 K/uL   RBC 4.79 3.87 - 5.11 MIL/uL   Hemoglobin 12.9 12.0 - 15.0 g/dL   HCT 39.0 30.0 - 92.3 %   MCV 86.2 80.0 - 100.0 fL   MCH 26.9 26.0 - 34.0 pg   MCHC 31.2 30.0 - 36.0 g/dL   RDW 30.0 (H) 76.2 - 26.3 %   Platelets 170 150 - 400 K/uL   nRBC 0.0 0.0 - 0.2 %  Surgical pcr screen     Status: None   Collection Time: 08/20/21  8:14 AM   Specimen: Nasal Mucosa; Nasal Swab  Result Value Ref Range   MRSA, PCR NEGATIVE NEGATIVE   Staphylococcus aureus NEGATIVE NEGATIVE    DG Chest 1 View  Result Date: 08/19/2021 CLINICAL DATA:  Fall. EXAM: CHEST  1 VIEW COMPARISON:  October 05, 2017. FINDINGS: Stable cardiomediastinal silhouette. Large hiatal hernia is noted. Both lungs are clear. The visualized skeletal structures are unremarkable. IMPRESSION: No active disease.  Electronically Signed   By: Lupita Raider M.D.   On: 08/19/2021 14:27   DG Hip Unilat  With Pelvis 2-3 Views Right  Result Date: 08/19/2021 CLINICAL DATA:  Fall, right hip pain EXAM: DG HIP (WITH OR WITHOUT PELVIS) 3V RIGHT COMPARISON:  Radiograph dated October 05, 2017. FINDINGS: Displaced right femoral neck fracture. Unchanged calcification located superior to the right greater trochanter. Degenerative changes of the right hip joint. Contrast material noted in the bowel. Soft tissues otherwise unremarkable. IMPRESSION: Displaced right femoral neck fracture. Electronically Signed   By: Allegra Lai M.D.   On: 08/19/2021 14:28    Assessment/Plan: Day of Surgery   Principal Problem:   Hip fracture [facility]) Active Problems:   Autoimmune hepatitis [facility])   Patient has been cleared medically for surgery.  I explained the details of the operation as well as the postoperative course with her.  Patient is in agreement with the plan for surgery.  Patient's right hip was marked in the preoperative area today.  I answered all her questions.  I called the patient's husband, at her request, and let him know the OR start time had changed to this morning.  I informed him that the surgery would take a couple hours and that I would call him again from the recovery room once the surgery was completed.  He understood and agreed with this plan.   Juanell Fairly , MD 08/20/2021, 10:01 AM

## 2021-08-20 NOTE — Anesthesia Procedure Notes (Signed)
Spinal  Patient location during procedure: OR Start time: 08/20/2021 10:00 AM End time: 08/20/2021 10:20 AM Reason for block: surgical anesthesia Staffing Performed: resident/CRNA and anesthesiologist  Anesthesiologist: Piscitello, Precious Haws, MD Resident/CRNA: Norm Salt, CRNA Preanesthetic Checklist Completed: patient identified, IV checked, site marked, risks and benefits discussed, surgical consent, monitors and equipment checked and pre-op evaluation Spinal Block Patient position: sitting Prep: ChloraPrep Patient monitoring: heart rate, cardiac monitor, continuous pulse ox and blood pressure Approach: midline Location: L3-4 Injection technique: single-shot Needle Needle type: Pencan  Needle gauge: 24 G Needle length: 10 cm Assessment Events: CSF return Additional Notes IV functioning, monitors applied to pt. Expiration date of kit checked and confirmed to be in date. Sterile prep and drape, hand hygiene and sterile gloved used. Pt was positioned and spine was prepped in sterile fashion. Skin was anesthetized with lidocaine. Free flow of clear CSF obtained prior to injecting local anesthetic into CSF x 1 attempt. Spinal needle aspirated freely following injection. Needle was carefully withdrawn, and pt tolerated procedure well. Loss of motor and sensory on exam post injection.

## 2021-08-20 NOTE — Progress Notes (Signed)
Pt c/o nausea. Dr. Randa Ngo notified. Acknowledged. Orders received. See [facility]

## 2021-08-20 NOTE — Anesthesia Postprocedure Evaluation (Signed)
Anesthesia Post Note  Patient: Marleta S Meyers  Procedure(s) Performed: ARTHROPLASTY BIPOLAR HIP (HEMIARTHROPLASTY) (Right: Hip)  Patient location during evaluation: PACU Anesthesia Type: Spinal Level of consciousness: awake and alert Pain management: pain level controlled Vital Signs Assessment: post-procedure vital signs reviewed and stable Respiratory status: spontaneous breathing, nonlabored ventilation, respiratory function stable and patient connected to nasal cannula oxygen Cardiovascular status: blood pressure returned to baseline and stable Postop Assessment: no apparent nausea or vomiting Anesthetic complications: no   No notable events documented.   Last Vitals:  Vitals:   08/20/21 1400 08/20/21 1415  BP: 131/74 (!) 108/92  Pulse: 70 69  Resp: (!) 29 17  Temp:  36.4 C  SpO2: 91% 96%    Last Pain:  Vitals:   08/20/21 1415  TempSrc:   PainSc: 0-No pain                 Cleda Mccreedy Dahlton Hinde

## 2021-08-20 NOTE — Progress Notes (Signed)
°  Progress Note   Patient: CASELYN [name] [id number] DOB: Jul 23, 1945 DOA: 08/19/2021     1 DOS: the patient was seen and examined on 08/20/2021    Assessment and Plan: * Hip fracture ([place])- (present on admission) Patient seen this morning prior to the operating room.  No contraindications to surgery at this time.  Dr. Mack Guise to do a right hip repair today  Autoimmune hepatitis ([place]) Will give 1 dose of Solu-Medrol prior to surgery.  Can switch back to prednisone for tomorrow.  Continue Imuran  Nausea Patient was nauseous this morning and I ordered IV Zofran.  Hypertension Continue Norvasc and metoprolol  CKD (chronic kidney disease), stage III (H[facility]) CKD stage IIIa.  Continue to monitor creatinine.  GERD (gastroesophageal reflux disease) Continue PPI        Subjective: Patient seen this morning and she was complaining of nausea.  I ordered nausea medication for her.  Having some hip discomfort.  Slipped on a wet surface trying to get home yesterday.  Physical Exam: Vitals:   08/20/21 0414 08/20/21 0736 08/20/21 0821 08/20/21 0824  BP: 124/76 133/85 (!) 139/96   Pulse: 77 72 72 72  Resp: 18 15 18    Temp: 97.6 F (36.4 C) 97.7 F (36.5 C) (!) 96.7 F (35.9 C)   TempSrc:  Oral Temporal   SpO2: 95% 94% 90% 93%  Weight:      Height:       Physical Exam HENT:     Head: Normocephalic.     Mouth/Throat:     Pharynx: No oropharyngeal exudate.  Eyes:     General: Lids are normal.     Conjunctiva/sclera: Conjunctivae normal.  Cardiovascular:     Rate and Rhythm: Normal rate and regular rhythm.     Heart sounds: Normal heart sounds, S1 normal and S2 normal.  Pulmonary:     Breath sounds: No decreased breath sounds, wheezing, rhonchi or rales.  Abdominal:     Palpations: Abdomen is soft.     Tenderness: There is no abdominal tenderness.  Musculoskeletal:     Right lower leg: No swelling.     Left lower leg: No swelling.     Comments: Right leg shortened   Skin:    General: Skin is warm.     Findings: No rash.  Neurological:     Mental Status: She is alert and oriented to person, place, and time.     Data Reviewed: Laboratory and radiological data reviewed.  X-ray showing displaced right femoral neck fracture.  Family Communication: As per orthopedics today since the patient is in the operating room now  Disposition: Status is: Inpatient Requires inpatient status because going to the operating room for hip fracture surgery today.  Planned Discharge Destination: Rehab  Author: Loletha Grayer, MD 08/20/2021 12:27 PM  For on call review [email or web address].

## 2021-08-20 NOTE — Assessment & Plan Note (Addendum)
Resolved

## 2021-08-20 NOTE — Anesthesia Procedure Notes (Signed)
Procedure Name: Intubation Date/Time: 08/20/2021 11:01 AM Performed by: Lily Peer, Vivek Grealish, CRNA Pre-anesthesia Checklist: Patient identified, Emergency Drugs available, Suction available and Patient being monitored Patient Re-evaluated:Patient Re-evaluated prior to induction Oxygen Delivery Method: Circle system utilized Preoxygenation: Pre-oxygenation with 100% oxygen Induction Type: IV induction Ventilation: Mask ventilation without difficulty Laryngoscope Size: McGraph and 3 Grade View: Grade I Tube type: Oral Tube size: 7.0 mm Number of attempts: 1 Airway Equipment and Method: Stylet Placement Confirmation: ETT inserted through vocal cords under direct vision, positive ETCO2 and breath sounds checked- equal and bilateral Secured at: 20 cm Tube secured with: Tape Dental Injury: Teeth and Oropharynx as per pre-operative assessment

## 2021-08-20 NOTE — Progress Notes (Signed)
Initial Nutrition Assessment  DOCUMENTATION CODES:   Not applicable  INTERVENTION:   -Once diet is advanced, add:   -Ensure Enlive po BID, each supplement provides 350 kcal and 20 grams of protein.  -MVI with minerals daily  NUTRITION DIAGNOSIS:   Increased nutrient needs related to post-op healing as evidenced by estimated needs.  GOAL:   Patient will meet greater than or equal to 90% of their needs  MONITOR:   PO intake, Supplement acceptance, Diet advancement, Labs, Weight trends, Skin, I & O's  REASON FOR ASSESSMENT:   Consult Assessment of nutrition requirement/status, Hip fracture protocol  ASSESSMENT:   [name] [name] is a 76 y.o. female with medical history significant of  osteoporosis, autoimmune hepatitis, HTN, GERD, chronic iron deficiency anemia, chronic ambulation dysfunction, presented with mechanical fall and right hip fracture.  Pt admitted with rt hip fracture s/p mechanical fall.   Pt unavailable at time of visit. Pt currently on OR. RD unable to obtain further nutrition-related history or complete nutrition-focused physical exam at this time.    Per orthopedics notes, plan for hemiarthroplasty today. Pt is currently NPO for procedure.   Reviewed wt hx; pt has experienced a 6.6% wt loss over the past 3 months, which is not significant for time frame, however, concerning given advanced age and multiple co-morbidities.   Pt would greatly benefit from addition of oral nutrition supplements.    Medications reviewed and include calcium carbonate and prednisone.   Labs reviewed.   Diet Order:   Diet Order             Diet NPO time specified  Diet effective midnight                   EDUCATION NEEDS:   No education needs have been identified at this time  Skin:  Skin Assessment: Reviewed RN Assessment  Last BM:  08/19/21  Height:   Ht Readings from Last 1 Encounters:  08/19/21 5\' 3"  (1.6 m)    Weight:   Wt Readings from Last 1  Encounters:  08/19/21 57.6 kg    Ideal Body Weight:  52.3 kg  BMI:  Body mass index is 22.5 kg/m.  Estimated Nutritional Needs:   Kcal:  1550-1750  Protein:  75-90 grams  Fluid:  > 1.5 L    Loistine Chance, RD, LDN, [place] Registered Dietitian II Certified Diabetes Care and Education Specialist Please refer to [facility] for RD and/or RD on-call/weekend/after hours pager

## 2021-08-20 NOTE — Assessment & Plan Note (Signed)
Continue PPI ?

## 2021-08-20 NOTE — Assessment & Plan Note (Addendum)
Status post right hip hemiarthroplasty on 08/20/2020.  Pain control. Physical therapy recommending rehab.  Out to rehab today.

## 2021-08-20 NOTE — Progress Notes (Signed)
Subjective:  POST OP CHECK: s/p right hip hemiarthroplasty.   Patient reports right hip pain as mild to moderate.  Patient's husband is at the bedside.  Objective:   VITALS:   Vitals:   08/20/21 1345 08/20/21 1400 08/20/21 1415 08/20/21 1513  BP: 129/61 131/74 (!) 108/92 109/65  Pulse:  70 69 64  Resp: (!) 28 (!) 29 17 16   Temp:   97.6 F (36.4 C)   TempSrc:      SpO2:  91% 96% 97%  Weight:      Height:        PHYSICAL EXAM: Right lower extremity Neurovascular intact Sensation intact distally Intact pulses distally Dorsiflexion/Plantar flexion intact Incision: dressing C/D/I No cellulitis present Compartment soft  LABS  Results for orders placed or performed during the hospital encounter of 08/19/21 (from the past 24 hour(s))  Magnesium     Status: None   Collection Time: 08/19/21  6:40 PM  Result Value Ref Range   Magnesium 1.8 1.7 - 2.4 mg/dL  Comprehensive metabolic panel     Status: Abnormal   Collection Time: 08/19/21  6:48 PM  Result Value Ref Range   Sodium 135 135 - 145 mmol/L   Potassium 3.6 3.5 - 5.1 mmol/L   Chloride 103 98 - 111 mmol/L   CO2 23 22 - 32 mmol/L   Glucose, Bld 141 (H) 70 - 99 mg/dL   BUN 18 8 - 23 mg/dL   Creatinine, Ser 1.13 (H) 0.44 - 1.00 mg/dL   Calcium 9.2 8.9 - 10.3 mg/dL   Total Protein 7.2 6.5 - 8.1 g/dL   Albumin 3.6 3.5 - 5.0 g/dL   AST 26 15 - 41 U/L   ALT 15 0 - 44 U/L   Alkaline Phosphatase 43 38 - 126 U/L   Total Bilirubin 0.7 0.3 - 1.2 mg/dL   GFR, Estimated 51 (L) >60 mL/min   Anion gap 9 5 - 15  CBC     Status: Abnormal   Collection Time: 08/20/21  4:01 AM  Result Value Ref Range   WBC 8.7 4.0 - 10.5 K/uL   RBC 4.79 3.87 - 5.11 MIL/uL   Hemoglobin 12.9 12.0 - 15.0 g/dL   HCT 41.3 36.0 - 46.0 %   MCV 86.2 80.0 - 100.0 fL   MCH 26.9 26.0 - 34.0 pg   MCHC 31.2 30.0 - 36.0 g/dL   RDW 18.5 (H) 11.5 - 15.5 %   Platelets 170 150 - 400 K/uL   nRBC 0.0 0.0 - 0.2 %  Surgical pcr screen     Status: None    Collection Time: 08/20/21  8:14 AM   Specimen: Nasal Mucosa; Nasal Swab  Result Value Ref Range   MRSA, PCR NEGATIVE NEGATIVE   Staphylococcus aureus NEGATIVE NEGATIVE    DG Chest 1 View  Result Date: 08/19/2021 CLINICAL DATA:  Fall. EXAM: CHEST  1 VIEW COMPARISON:  October 05, 2017. FINDINGS: Stable cardiomediastinal silhouette. Large hiatal hernia is noted. Both lungs are clear. The visualized skeletal structures are unremarkable. IMPRESSION: No active disease. Electronically Signed   By: Marijo Conception M.D.   On: 08/19/2021 14:27   DG Hip Port Unilat With Pelvis 1V Right  Result Date: 08/20/2021 CLINICAL DATA:  Right hip replacement EXAM: DG HIP (WITH OR WITHOUT PELVIS) 1V PORT RIGHT COMPARISON:  None. FINDINGS: Generalized osteopenia. Interval right hip arthroplasty without hardware failure or complication. Normal alignment. Postsurgical changes in the surrounding soft tissues. Nondisplaced fracture of the right  inferior pubic ramus. Otherwise, no acute fracture or dislocation. IMPRESSION: 1. Interval right hip arthroplasty. Electronically Signed   By: Kathreen Devoid M.D.   On: 08/20/2021 13:02   DG Hip Unilat  With Pelvis 2-3 Views Right  Result Date: 08/19/2021 CLINICAL DATA:  Fall, right hip pain EXAM: DG HIP (WITH OR WITHOUT PELVIS) 3V RIGHT COMPARISON:  Radiograph dated October 05, 2017. FINDINGS: Displaced right femoral neck fracture. Unchanged calcification located superior to the right greater trochanter. Degenerative changes of the right hip joint. Contrast material noted in the bowel. Soft tissues otherwise unremarkable. IMPRESSION: Displaced right femoral neck fracture. Electronically Signed   By: Yetta Glassman M.D.   On: 08/19/2021 14:28    Assessment/Plan: Day of Surgery   Principal Problem:   Closed hip fracture requiring operative repair, right, sequela Active Problems:   Autoimmune hepatitis ([place])   Nausea   Hypertension   GERD (gastroesophageal reflux disease)   CKD  (chronic kidney disease), stage III ([place])  Patient will complete 24 hours of postop antibiotics.  Patient will continue current pain management.  Patient will have her Foley catheter removed in the morning.  Patient will have labs drawn in the AM.  Patient is weightbearing as tolerated and will begin therapy tomorrow.  I reviewed the postoperative x-ray which demonstrates the right hip hemiarthroplasty prosthesis is well-positioned.  There is no evidence of postop complication.  I answered all questions by the patient and her husband.    Thornton Park , MD 08/20/2021, 6:33 PM

## 2021-08-20 NOTE — Assessment & Plan Note (Addendum)
Continue Imuran and prednisone.

## 2021-08-20 NOTE — Op Note (Signed)
08/20/2021  12:30 PM  PATIENT:  [name] [name]   MRN: [id number]  PRE-OPERATIVE DIAGNOSIS: Displaced right femoral neck hip fracture.  Severe bilateral foot onychomycosis  POST-OPERATIVE DIAGNOSIS: Same  PROCEDURE:  Procedure(s): Right hip hemiarthroplasty and severe toenail overgrowth bilaterally  PREOPERATIVE INDICATIONS:    [name] [name] is an 76 y.o. female who was admitted with a diagnosis of displaced right femoral neck hip fracture.  I have recommended surgical fixation with hemiarthroplasty for this injury. I have explained the surgery and the postoperative course to the patient who agreed with surgical management of this fracture.    The risks benefits and alternatives were discussed with the patient and their family including but not limited to the risks of  infection requiring removal of the prosthesis, bleeding requiring blood transfusion, nerve injury especially to the sciatic nerve leading to foot drop or lower extremity numbness, periprosthetic fracture, dislocation leg length discrepancy, change in lower extremity rotation persistent hip pain, loosening or failure of the components and the need for revision surgery. Medical risks include but are not limited to DVT and pulmonary embolism, myocardial infarction, stroke, pneumonia, respiratory failure and death.  OPERATIVE REPORT     SURGEON:  Thornton Park, MD    ASSISTANT: Surgical tech    ANESTHESIA: Spinal converted to general anesthesia    COMPLICATIONS:  None.   SPECIMEN: Femoral head to pathology  EBL:  200 cc    COMPONENTS:  [employer] Accolade 2 femoral component size 3 with a 48 mm Unitrax head with a +4 inner head component    PROCEDURE IN DETAIL:   The patient was met in the holding area and  identified.  The appropriate hip was identified and marked at the operative site after verbally confirming with the patient that this was the correct site of surgery.  The patient was then transported to the OR  and   underwent spinal anesthesia.  Patient was still reacting to the skin incision and therefore was converted to general anesthesia.  The patient was then placed in the lateral decubitus position with the operative side up and secured on the operating room table with a pegboard and all bony prominences were adequately padded. This included an axillary roll and additional padding around the nonoperative leg to prevent compression to the common peroneal nerve.    The operative lower extremity was prepped and draped in a sterile fashion.  A time out was performed prior to incision to verify patient's name, date of birth, medical record number, correct site of surgery correct procedure to be performed. The timeout was also used to verify the patient received antibiotics now appropriate instruments, implants and radiographic studies were available in the room. Once all in attendance were in agreement case began.    A posterolateral approach was utilized via sharp dissection  carried down to the subcutaneous tissue.  Bleeding vessels were coagulated using electrocautery.  The fascia lata was identified and incised along the length of the skin incision.  The gluteus maximus muscle was then split in line with its fibers. Self-retaining retractors were  inserted.  With the hip internally rotated, the short external rotators  were identified and removed from the posterior attachment from the greater trochanter. The piriformis was tagged for later repair. The capsule was identified and a T-shaped capsulotomy was performed. The capsule was tagged with #2 Tycron for later repair.  The femoral neck fracture was exposed, and the femoral head was removed using a corkscrew device. This was measured  to be 28 mm in diameter. The attention was then turned to proximal femur preparation.  An oscillating saw was used to perform a proximal femoral osteotomy 1 fingerbreadth above the lesser trochanter. The trial 48 mm femoral head was  placed into the acetabulum and had an excellent suction fit. The attention was then turned back to femoral preparation.    A femoral skid and Cobra retractor were placed under the femoral neck to allow for adequate visualization. A box osteotome was used to make the initial entry into the proximal femur. A single hand reamer was used to prepare the femoral canal. A T-shaped femoral canal sounder was then used to ensure no penetration femoral cortex had occurred during reaming. The proximal femur was then sequentially broached by hand. A size 3 Accolade 2 femoral trial broach was found to have best medial to lateral canal fit. Once adequate mediolateral canal fill was achieved the trial femoral broach, neck, and head was assembled and the hip was reduced. It was found to have excellent stability, equivalent leg lengths with functional range of motion. The trial components were then removed.  I copiously irrigated the femoral canal and then impacted the real femoral prosthesis into place into the appropriate version, slightly anteverted to the normal anatomy, and I impacted the actual 48 mm Unitrax femoral component with a +4 neck adjustment sleeve into place. The hip was then reduced and taken through functional range of motion and found to have excellent stability. Leg lengths were restored. The hip joint was copiously irrigated.   A soft tissue repair of the capsule and external rotators was performed using #2 Tycron Excellent posterior capsular repair was achieved. The fascia lata was then closed with interrupted 0 Vicryl suture. The subcutaneous tissues were closed with 2-0 Vicryl and the skin approximated with staples.   The patient was then placed supine on the operative table. Leg lengths were checked clinically and found to be equivalent. An abduction pillow was placed between the lower extremities.   Patient had severe onychomycosis in both feet.  I was concerned about possible infection leading to  contamination of her hip prosthesis.  Therefore patient had all 10 toenails trimmed with a bone cutter.  Patient's toenails were approximately 3 cm in length and severely twisted and curved.    The patient was then transferred to a hospital bed and brought to the PACU in stable condition. I was scrubbed and present the entire case and all sharp and instrument counts were correct at the conclusion of the case. I spoke with the patient's husband by phone from the PACU to let him know the case was completed without complication patient was stable in recovery room.   Timoteo Gaul, MD Orthopedic Surgeon

## 2021-08-20 NOTE — Assessment & Plan Note (Deleted)
CKD stage IIIa.  Continue to monitor creatinine.

## 2021-08-20 NOTE — Anesthesia Preprocedure Evaluation (Signed)
Anesthesia Evaluation  Patient identified by MRN, date of birth, ID band Patient awake    Reviewed: Allergy & Precautions, NPO status , Patient's Chart, lab work & pertinent test results  History of Anesthesia Complications Negative for: history of anesthetic complications  Airway Mallampati: III  TM Distance: >3 FB Neck ROM: full    Dental  (+) Chipped, Poor Dentition, Missing   Pulmonary neg pulmonary ROS, neg shortness of breath,    Pulmonary exam normal        Cardiovascular hypertension, (-) angina+ DOE  (-) Past MI and (-) PND Normal cardiovascular exam     Neuro/Psych negative neurological ROS  negative psych ROS   GI/Hepatic GERD  ,(+) Hepatitis -, Autoimmune  Endo/Other  negative endocrine ROS  Renal/GU      Musculoskeletal   Abdominal   Peds  Hematology negative hematology ROS (+)   Anesthesia Other Findings Past Medical History: No date: Abnormal LFTs (liver function tests) 2018: Autoimmune hepatitis (HCC) No date: Autoimmune hepatitis (HCC) No date: Autoimmune hepatitis treated with steroids (HCC) No date: DOE (dyspnea on exertion) No date: GERD (gastroesophageal reflux disease) No date: Hepatitis No date: Hyperlipidemia No date: Hypertension 05/27/2021: Iron deficiency anemia due to chronic blood loss No date: Osteoporosis  Past Surgical History: No date: ABDOMINAL HYSTERECTOMY 10/06/2015: COLONOSCOPY 08/27/2018: COLONOSCOPY WITH PROPOFOL; N/A     Comment:  Procedure: COLONOSCOPY WITH PROPOFOL;  Surgeon: Scot Jun, MD;  Location: [facility] ENDOSCOPY;  Service:               Endoscopy;  Laterality: N/A; 08/27/2018: ESOPHAGOGASTRODUODENOSCOPY (EGD) WITH PROPOFOL; N/A     Comment:  Procedure: ESOPHAGOGASTRODUODENOSCOPY (EGD) WITH               PROPOFOL;  Surgeon: Scot Jun, MD;  Location:               [facility] ENDOSCOPY;  Service: Endoscopy;  Laterality: N/A; 05/21/2021:  ESOPHAGOGASTRODUODENOSCOPY (EGD) WITH PROPOFOL; N/A     Comment:  Procedure: ESOPHAGOGASTRODUODENOSCOPY (EGD) WITH               PROPOFOL;  Surgeon: Regis Bill, MD;  Location:               [facility] ENDOSCOPY;  Service: Endoscopy;  Laterality: N/A; No date: LIVER BIOPSY  BMI    Body Mass Index: 22.50 kg/m      Reproductive/Obstetrics negative OB ROS                             Anesthesia Physical Anesthesia Plan  ASA: 3  Anesthesia Plan: Spinal   Post-op Pain Management:    Induction:   PONV Risk Score and Plan:   Airway Management Planned: Natural Airway and Nasal Cannula  Additional Equipment:   Intra-op Plan:   Post-operative Plan:   Informed Consent: I have reviewed the patients History and Physical, chart, labs and discussed the procedure including the risks, benefits and alternatives for the proposed anesthesia with the patient or authorized representative who has indicated his/her understanding and acceptance.     Dental Advisory Given  Plan Discussed with: Anesthesiologist, CRNA and Surgeon  Anesthesia Plan Comments: (Patient reports no bleeding problems and no anticoagulant use.  Plan for spinal with backup GA  Patient consented for risks of anesthesia including but not limited to:  - adverse reactions to medications -  damage to eyes, teeth, lips or other oral mucosa - nerve damage due to positioning  - risk of bleeding, infection and or nerve damage from spinal that could lead to paralysis - risk of headache or failed spinal - damage to teeth, lips or other oral mucosa - sore throat or hoarseness - damage to heart, brain, nerves, lungs, other parts of body or loss of life  Patient voiced understanding.)        Anesthesia Quick Evaluation

## 2021-08-20 NOTE — Transfer of Care (Signed)
Immediate Anesthesia Transfer of Care Note  Patient: [name] [name]  Procedure(s) Performed: ARTHROPLASTY BIPOLAR HIP (HEMIARTHROPLASTY) (Right: Hip)  Patient Location: PACU  Anesthesia Type:General  Level of Consciousness: drowsy  Airway & Oxygen Therapy: Patient Spontanous Breathing and Patient connected to face mask oxygen  Post-op Assessment: Report given to RN and Post -op Vital signs reviewed and stable  Post vital signs: Reviewed and stable  Last Vitals:  Vitals Value Taken Time  BP 127/65   Temp    Pulse 63   Resp 16   SpO2 98     Last Pain:  Vitals:   08/20/21 0821  TempSrc: Temporal  PainSc: 0-No pain         Complications: No notable events documented.

## 2021-08-21 DIAGNOSIS — K219 Gastro-esophageal reflux disease without esophagitis: Secondary | ICD-10-CM | POA: Diagnosis not present

## 2021-08-21 DIAGNOSIS — B351 Tinea unguium: Secondary | ICD-10-CM

## 2021-08-21 DIAGNOSIS — E871 Hypo-osmolality and hyponatremia: Secondary | ICD-10-CM

## 2021-08-21 DIAGNOSIS — K754 Autoimmune hepatitis: Secondary | ICD-10-CM | POA: Diagnosis not present

## 2021-08-21 DIAGNOSIS — N179 Acute kidney failure, unspecified: Secondary | ICD-10-CM | POA: Diagnosis not present

## 2021-08-21 DIAGNOSIS — I10 Essential (primary) hypertension: Secondary | ICD-10-CM | POA: Diagnosis not present

## 2021-08-21 DIAGNOSIS — N189 Chronic kidney disease, unspecified: Secondary | ICD-10-CM

## 2021-08-21 LAB — BASIC METABOLIC PANEL
Anion gap: 8 (ref 5–15)
BUN: 22 mg/dL (ref 8–23)
CO2: 23 mmol/L (ref 22–32)
Calcium: 8.3 mg/dL — ABNORMAL LOW (ref 8.9–10.3)
Chloride: 100 mmol/L (ref 98–111)
Creatinine, Ser: 1.59 mg/dL — ABNORMAL HIGH (ref 0.44–1.00)
GFR, Estimated: 34 mL/min — ABNORMAL LOW (ref >60)
Glucose, Bld: 105 mg/dL — ABNORMAL HIGH (ref 70–99)
Potassium: 3.7 mmol/L (ref 3.5–5.1)
Sodium: 131 mmol/L — ABNORMAL LOW (ref 135–145)

## 2021-08-21 LAB — CBC
HCT: 32 % — ABNORMAL LOW (ref 36.0–46.0)
Hemoglobin: 10.2 g/dL — ABNORMAL LOW (ref 12.0–15.0)
MCH: 27.6 pg (ref 26.0–34.0)
MCHC: 31.9 g/dL (ref 30.0–36.0)
MCV: 86.5 fL (ref 80.0–100.0)
Platelets: 171 10*3/uL (ref 150–400)
RBC: 3.7 MIL/uL — ABNORMAL LOW (ref 3.87–5.11)
RDW: 18.6 % — ABNORMAL HIGH (ref 11.5–15.5)
WBC: 11.9 10*3/uL — ABNORMAL HIGH (ref 4.0–10.5)
nRBC: 0 % (ref 0.0–0.2)

## 2021-08-21 MED ORDER — SODIUM CHLORIDE 0.9 % IV BOLUS
500.0000 mL | Freq: Once | INTRAVENOUS | Status: AC
  Administered 2021-08-21: 500 mL via INTRAVENOUS

## 2021-08-21 NOTE — Progress Notes (Signed)
°  Progress Note   Patient: [name] [name] [id number] DOB: May 18, 1946 DOA: 08/19/2021     2 DOS: the patient was seen and examined on 08/21/2021    Assessment and Plan: * Closed hip fracture requiring operative repair, right, sequela Status post right hip hemiarthroplasty.  Pain control. Physical therapy recommending rehab.  Acute kidney injury superimposed on CKD ([place]) Today the patient has acute kidney injury on chronic kidney disease stage IIIa.  Creatinine went up to 1.59.  Yesterday's creatinine 1.13.  We will give IV fluid bolus and recheck creatinine tomorrow.  Autoimmune hepatitis ([place]) Continue Imuran and prednisone.  Received 1 dose of Solu-Medrol prior to surgery.  Hypertension Continue Norvasc and metoprolol  GERD (gastroesophageal reflux disease) Continue PPI  Nausea Resolved  Hyponatremia Sodium 131 today.  IV fluid hydration today and recheck tomorrow.  Toenail fungus Will need podiatry as outpatient to cut these overgrown toenails.        Subjective: Patient feels okay.  Offers no complaints.  Admitted after fall and found to have a right hip fracture.  Physical Exam: Vitals:   08/21/21 0336 08/21/21 0820 08/21/21 1213 08/21/21 1214  BP: 115/72 109/72 (!) 98/57 (!) 98/57  Pulse: 64 70 64 64  Resp: 14 16 16 16   Temp: 98.1 F (36.7 C) 98 F (36.7 C) 98.1 F (36.7 C) 98.1 F (36.7 C)  TempSrc:  Oral  Oral  SpO2: 93% 98% 95%   Weight:      Height:       Physical Exam HENT:     Head: Normocephalic.     Mouth/Throat:     Pharynx: No oropharyngeal exudate.  Eyes:     General: Lids are normal.     Conjunctiva/sclera: Conjunctivae normal.  Cardiovascular:     Rate and Rhythm: Normal rate and regular rhythm.     Heart sounds: Normal heart sounds, S1 normal and S2 normal.  Pulmonary:     Breath sounds: No decreased breath sounds, wheezing, rhonchi or rales.  Abdominal:     Palpations: Abdomen is soft.     Tenderness: There is no abdominal  tenderness.  Musculoskeletal:     Right lower leg: No swelling.     Left lower leg: No swelling.     Comments: Right leg shortened  Skin:    General: Skin is warm.     Findings: No rash.     Comments: Overgrown toenails.  Neurological:     Mental Status: She is alert and oriented to person, place, and time.     Data Reviewed: Creatinine up to 1.59 today.  Sodium down to 131.  Hemoglobin up to 10.2.   Family Communication: Tried to reach husband on the phone  Disposition: Status is: Inpatient Remains inpatient appropriate because postop day 1 right hemiarthroplasty.  Planned Discharge Destination: Rehab   Author: Loletha Grayer, MD 08/21/2021 2:41 PM  For on call review [email or web address].

## 2021-08-21 NOTE — Plan of Care (Signed)
Patient sleeping between care. Aox4. Pain controlled. Surgical dressing C/D/I. Plan of care reviewed with patient. Call bell within reach. Bed alarm on.  PLAN OF CARE ONGOING Problem: Education: Goal: Knowledge of General Education information will improve Description: Including pain rating scale, medication(s)/side effects and non-pharmacologic comfort measures Outcome: Progressing   Problem: Health Behavior/Discharge Planning: Goal: Ability to manage health-related needs will improve Outcome: Progressing   Problem: Clinical Measurements: Goal: Ability to maintain clinical measurements within normal limits will improve Outcome: Progressing Goal: Will remain free from infection Outcome: Progressing Goal: Diagnostic test results will improve Outcome: Progressing Goal: Respiratory complications will improve Outcome: Progressing Goal: Cardiovascular complication will be avoided Outcome: Progressing   Problem: Activity: Goal: Risk for activity intolerance will decrease Outcome: Progressing   Problem: Nutrition: Goal: Adequate nutrition will be maintained Outcome: Progressing   Problem: Coping: Goal: Level of anxiety will decrease Outcome: Progressing   Problem: Elimination: Goal: Will not experience complications related to bowel motility Outcome: Progressing Goal: Will not experience complications related to urinary retention Outcome: Progressing   Problem: Pain Managment: Goal: General experience of comfort will improve Outcome: Progressing   Problem: Safety: Goal: Ability to remain free from injury will improve Outcome: Progressing   Problem: Skin Integrity: Goal: Risk for impaired skin integrity will decrease Outcome: Progressing   Problem: Education: Goal: Verbalization of understanding the information provided (i.e., activity precautions, restrictions, etc) will improve Outcome: Progressing Goal: Individualized Educational Video(s) Outcome: Progressing    Problem: Activity: Goal: Ability to ambulate and perform ADLs will improve Outcome: Progressing   Problem: Clinical Measurements: Goal: Postoperative complications will be avoided or minimized Outcome: Progressing   Problem: Self-Concept: Goal: Ability to maintain and perform role responsibilities to the fullest extent possible will improve Outcome: Progressing   Problem: Pain Management: Goal: Pain level will decrease Outcome: Progressing

## 2021-08-21 NOTE — Assessment & Plan Note (Addendum)
Last sodium 134.  Went down to 131 at its lowest point.

## 2021-08-21 NOTE — Progress Notes (Addendum)
Subjective:  POD #1 s/p right hip hemiarthroplasty.   Patient reports right hip pain as moderate.  Patient seen getting up with PT.  She complains of moderate right hip pain with standing.  Patient's physical therapist and her sister are in the room during my evaluation.  Objective:   VITALS:   Vitals:   08/20/21 1513 08/20/21 2002 08/21/21 0336 08/21/21 0820  BP: 109/65 93/63 115/72 109/72  Pulse: 64 71 64 70  Resp: 16 14 14 16   Temp:  98.2 F (36.8 C) 98.1 F (36.7 C) 98 F (36.7 C)  TempSrc:    Oral  SpO2: 97% 94% 93% 98%  Weight:      Height:        PHYSICAL EXAM: Right lower extremity: AVSS.  No acute distress Neurovascular intact Sensation intact distally Intact pulses distally Dorsiflexion/Plantar flexion intact Incision: dressing C/D/I No cellulitis present Compartment soft  LABS  Results for orders placed or performed during the hospital encounter of 08/19/21 (from the past 24 hour(s))  CBC     Status: Abnormal   Collection Time: 08/21/21  5:07 AM  Result Value Ref Range   WBC 11.9 (H) 4.0 - 10.5 K/uL   RBC 3.70 (L) 3.87 - 5.11 MIL/uL   Hemoglobin 10.2 (L) 12.0 - 15.0 g/dL   HCT 32.0 (L) 36.0 - 46.0 %   MCV 86.5 80.0 - 100.0 fL   MCH 27.6 26.0 - 34.0 pg   MCHC 31.9 30.0 - 36.0 g/dL   RDW 18.6 (H) 11.5 - 15.5 %   Platelets 171 150 - 400 K/uL   nRBC 0.0 0.0 - 0.2 %  Basic metabolic panel     Status: Abnormal   Collection Time: 08/21/21  5:07 AM  Result Value Ref Range   Sodium 131 (L) 135 - 145 mmol/L   Potassium 3.7 3.5 - 5.1 mmol/L   Chloride 100 98 - 111 mmol/L   CO2 23 22 - 32 mmol/L   Glucose, Bld 105 (H) 70 - 99 mg/dL   BUN 22 8 - 23 mg/dL   Creatinine, Ser 1.59 (H) 0.44 - 1.00 mg/dL   Calcium 8.3 (L) 8.9 - 10.3 mg/dL   GFR, Estimated 34 (L) >60 mL/min   Anion gap 8 5 - 15    DG Chest 1 View  Result Date: 08/19/2021 CLINICAL DATA:  Fall. EXAM: CHEST  1 VIEW COMPARISON:  October 05, 2017. FINDINGS: Stable cardiomediastinal silhouette. Large  hiatal hernia is noted. Both lungs are clear. The visualized skeletal structures are unremarkable. IMPRESSION: No active disease. Electronically Signed   By: Marijo Conception M.D.   On: 08/19/2021 14:27   DG Hip Port Unilat With Pelvis 1V Right  Result Date: 08/20/2021 CLINICAL DATA:  Right hip replacement EXAM: DG HIP (WITH OR WITHOUT PELVIS) 1V PORT RIGHT COMPARISON:  None. FINDINGS: Generalized osteopenia. Interval right hip arthroplasty without hardware failure or complication. Normal alignment. Postsurgical changes in the surrounding soft tissues. Nondisplaced fracture of the right inferior pubic ramus. Otherwise, no acute fracture or dislocation. IMPRESSION: 1. Interval right hip arthroplasty. Electronically Signed   By: Kathreen Devoid M.D.   On: 08/20/2021 13:02   DG Hip Unilat  With Pelvis 2-3 Views Right  Result Date: 08/19/2021 CLINICAL DATA:  Fall, right hip pain EXAM: DG HIP (WITH OR WITHOUT PELVIS) 3V RIGHT COMPARISON:  Radiograph dated October 05, 2017. FINDINGS: Displaced right femoral neck fracture. Unchanged calcification located superior to the right greater trochanter. Degenerative changes of the right  hip joint. Contrast material noted in the bowel. Soft tissues otherwise unremarkable. IMPRESSION: Displaced right femoral neck fracture. Electronically Signed   By: Yetta Glassman M.D.   On: 08/19/2021 14:28    Assessment/Plan: 1 Day Post-Op   Principal Problem:   Closed hip fracture requiring operative repair, right, sequela Active Problems:   Autoimmune hepatitis ([place])   Nausea   Hypertension   GERD (gastroesophageal reflux disease)   CKD (chronic kidney disease), stage III ([place])  Patient stable postop.  She has stable vital signs.  Her hemoglobin and hematocrit remained stable.  Patient will continue with physical therapy.  Continue with current pain management.  Foley catheter has been discontinued.  Recheck labs in the morning.  Begin Lovenox for DVT prophylaxis today.  Patient  will need a skilled nursing facility upon discharge.  I explained to the patient's sister what was done during surgery and what the postoperative course will entail.  I answered all questions by the patient and her sister.    Thornton Park , MD 08/21/2021, 11:16 AM

## 2021-08-21 NOTE — Evaluation (Signed)
Physical Therapy Evaluation Patient Details Name: [name] [name] MRN: [id number] DOB: 03-26-1946 Today's Date: 08/21/2021  History of Present Illness  Pt is a 76 y/o F admitted on 08/19/21 with c/c of fall & hitting her leg. Pt found to have : Displaced right femoral neck hip fracture, severe bilateral foot onychomycosis & pt underwent right hip hemiarthroplasty. Pt is WBAT with posterior hip precautions. PMH: osteoporosis, autoimmune hepatitis, HTN, GERD, chronic iron deficiency anemia, chronic ambulation dysfunction, HLD  Clinical Impression  Pt seen for PT evaluation with pt reporting prior to admission she was living with her husband, who is visually impaired, in a mobile home with 5 steps with B rails to enter, ambulatory with QC, driving, and had her last fall ~3 years ago. PT provided pt with HEP & reviewed & assisted pt with exercises; pt demonstrates decreased ability/understanding re: some exercises despite multimodal cuing. PT also educated pt on posterior hip precautions but no carryover by pt from beginning to end of session.  Pt requires min assist for bed mobility with heavy reliance on hospital bed features & min/mod assist to complete step pivot bed>recliner. Pt is able to progress to ambulating but only ~1 ft with RW & min assist with PT providing close chair follow. Pt would benefit from STR upon d/c to maximize independence with functional mobility & reduce fall risk prior to return home.     Recommendations for follow up therapy are one component of a multi-disciplinary discharge planning process, led by the attending physician.  Recommendations may be updated based on patient status, additional functional criteria and insurance authorization.  Follow Up Recommendations Skilled nursing-short term rehab (<3 hours/day)    Assistance Recommended at Discharge Frequent or constant Supervision/Assistance  Patient can return home with the following  A lot of help with walking and/or  transfers;A lot of help with bathing/dressing/bathroom;Assist for transportation;Assistance with cooking/housework;Direct supervision/assist for financial management;Direct supervision/assist for medications management;Help with stairs or ramp for entrance    Equipment Recommendations None recommended by PT  Recommendations for Other Services       Functional Status Assessment Patient has had a recent decline in their functional status and demonstrates the ability to make significant improvements in function in a reasonable and predictable amount of time.     Precautions / Restrictions Precautions Precautions: Fall;Posterior Hip Precaution Booklet Issued: Yes (comment) (HEP handout with paper re: posterior hip precautions & car transfer) Restrictions Weight Bearing Restrictions: Yes RLE Weight Bearing: Weight bearing as tolerated      Mobility  Bed Mobility Overal bed mobility: Needs Assistance Bed Mobility: Supine to Sit     Supine to sit: Min assist     General bed mobility comments: assistance to maneuver RLE to EOB, extra time & use of HOB elevated & bed rails to upright trunk to sitting EOB    Transfers Overall transfer level: Needs assistance Equipment used: Rolling walker (2 wheels) Transfers: Sit to/from Stand, Bed to chair/wheelchair/BSC Sit to Stand: Min assist   Step pivot transfers: Mod assist, Min assist (1 episode of buckling with mod assist, otherwise min assist)       General transfer comment: cuing for safe hand placement during sit<>stand    Ambulation/Gait Ambulation/Gait assistance: Min assist Gait Distance (Feet): 1 Feet Assistive device: Rolling walker (2 wheels) Gait Pattern/deviations: Decreased step length - right, Decreased step length - left, Decreased stride length, Decreased dorsiflexion - right, Decreased dorsiflexion - left, Decreased weight shift to right Gait velocity: decreased  General Gait Details: Poor foot clearance  BLE  Stairs            Wheelchair Mobility    Modified Rankin (Stroke Patients Only)       Balance Overall balance assessment: Needs assistance Sitting-balance support: Feet supported, Bilateral upper extremity supported Sitting balance-Leahy Scale: Fair Sitting balance - Comments: supervision static sitting   Standing balance support: Bilateral upper extremity supported, During functional activity Standing balance-Leahy Scale: Fair                               Pertinent Vitals/Pain Pain Assessment Pain Assessment: 0-10 Pain Score: 8  Pain Location: R hip with movement/weight bearing Pain Descriptors / Indicators: Discomfort Pain Intervention(s): Monitored during session, Repositioned, Premedicated before session    Home Living Family/patient expects to be discharged to:: Private residence Living Arrangements: Spouse/significant other Available Help at Discharge: Family;Available 24 hours/day (husband can assist but is limited as he is visually impaired) Type of Home: Mobile home Home Access: Stairs to enter Entrance Stairs-Rails: Right;Left;Can reach both Entrance Stairs-Number of Steps: 5   Home Layout: One level Home Equipment: Agricultural consultant (2 wheels);Cane - quad      Prior Function Prior Level of Function : Independent/Modified Independent;Driving             Mobility Comments: Pt reports she's ambulatory with QD       Hand Dominance        Extremity/Trunk Assessment   Upper Extremity Assessment Upper Extremity Assessment: Generalized weakness    Lower Extremity Assessment Lower Extremity Assessment: Generalized weakness (RLE knee extension 3-/5 in sitting)       Communication   Communication: No difficulties  Cognition Arousal/Alertness: Awake/alert Behavior During Therapy: WFL for tasks assessed/performed Overall Cognitive Status:  (need to assess further)                                 General  Comments: Pt appears to have difficulty processing instructions throughout session re: stepping to recliner & for proper technique re: some exercises & use of incentive spirometer        General Comments General comments (skin integrity, edema, etc.): PT reviewed use of incentive spirometer with pt with poor return demo from pt as she attempts to exhale vs inhale & pt barely able to achieve 250-500 mL.    Exercises General Exercises - Lower Extremity Ankle Circles/Pumps: AROM, Right, 10 reps, Supine (max mulitmodal cuing for technique) Quad Sets: AROM, Strengthening, Right, 10 reps, Supine (max mulitmodal cuing for technique) Gluteal Sets: AROM, Strengthening, Supine, 10 reps (max mulitmodal cuing for technique) Short Arc Quad: AROM, Strengthening, Right, 10 reps, Supine (max mulitmodal cuing for technique) Long Arc Quad: AROM, Right, Strengthening, 10 reps, Seated Heel Slides: AAROM, Strengthening, Right, 10 reps, Supine Hip ABduction/ADduction: AROM, AAROM, Strengthening, Supine, 10 reps (hip abduction slides x 10 AAROM, hip adduction pillow squeezes x 10)   Assessment/Plan    PT Assessment Patient needs continued PT services  PT Problem List Decreased strength;Decreased mobility;Decreased safety awareness;Decreased range of motion;Decreased coordination;Decreased knowledge of precautions;Decreased activity tolerance;Decreased cognition;Cardiopulmonary status limiting activity;Decreased skin integrity;Pain;Decreased knowledge of use of DME;Decreased balance       PT Treatment Interventions DME instruction;Therapeutic exercise;Gait training;Balance training;Stair training;Neuromuscular re-education;Functional mobility training;Cognitive remediation;Therapeutic activities;Patient/family education;Modalities    PT Goals (Current goals can be found in the Care Plan section)  Acute  Rehab PT Goals Patient Stated Goal: get better PT Goal Formulation: With patient Time For Goal Achievement:  09/04/21 Potential to Achieve Goals: Good    Frequency BID     Co-evaluation               AM-PAC PT "6 Clicks" Mobility  Outcome Measure Help needed turning from your back to your side while in a flat bed without using bedrails?: A Little Help needed moving from lying on your back to sitting on the side of a flat bed without using bedrails?: A Little Help needed moving to and from a bed to a chair (including a wheelchair)?: A Lot Help needed standing up from a chair using your arms (e.g., wheelchair or bedside chair)?: A Lot Help needed to walk in hospital room?: A Lot Help needed climbing 3-5 steps with a railing? : Total 6 Click Score: 13    End of Session Equipment Utilized During Treatment: Gait belt Activity Tolerance: Patient tolerated treatment well;Patient limited by pain;Patient limited by fatigue Patient left: in chair;with chair alarm set;with call bell/phone within reach;with bed alarm set (hip abduction wedge pillow in place) Nurse Communication: Mobility status;Precautions;Weight bearing status PT Visit Diagnosis: Unsteadiness on feet (R26.81);Muscle weakness (generalized) (M62.81);Difficulty in walking, not elsewhere classified (R26.2);Pain Pain - Right/Left: Right Pain - part of body: Hip    Time: 1031-1101 PT Time Calculation (min) (ACUTE ONLY): 30 min   Charges:   PT Evaluation $PT Eval Low Complexity: 1 Low PT Treatments $Therapeutic Exercise: 8-22 mins $Therapeutic Activity: 8-22 mins        Aleda GranaVictoria Raymona Boss, PT, DPT 08/21/21, 11:15 AM   Sandi MariscalVictoria M Kruti Horacek 08/21/2021, 11:12 AM

## 2021-08-21 NOTE — Assessment & Plan Note (Addendum)
Today the patient has acute kidney injury on chronic kidney disease stage IIIa.  Creatinine went up to 1.59.  Previous creatinine 1.13.  Yesterday's creatinine 1.30.  Recommend checking a BMP in 1 week.  Foley catheter will be placed today.

## 2021-08-21 NOTE — Evaluation (Signed)
Occupational Therapy Evaluation Patient Details Name: [name] [name] MRN: [id number] DOB: 1946/04/22 Today's Date: 08/21/2021   History of Present Illness Pt is a 76 y/o F admitted on 08/19/21 with c/c of fall & hitting her leg. Pt found to have : Displaced right femoral neck hip fracture, severe bilateral foot onychomycosis & pt underwent right hip hemiarthroplasty. Pt is WBAT with posterior hip precautions. PMH: osteoporosis, autoimmune hepatitis, HTN, GERD, chronic iron deficiency anemia, chronic ambulation dysfunction, HLD   Clinical Impression   Pt seen for OT evaluation this date in setting of acute hospitalization d/t fall, now s/p R hip hemiarthroplasty. She reports she is INDEP with ADLs at baseline and lives at home with her spouse. She presents this date with significant R hip pain and LPN medicated pt during session. Pt requires MIN A for ADL Transfers with RW as well as cues for safe use. She currently requires MOD A for seated LB ADLs and OT demos and educates re: task modification and use of AE. Pt left in chair w/ alarm set, all needs met and in reach. Will continue to follow acutely. Anticipate she will require STR f/u OT services to improve strength, balance, safety and INDEP for self care and ADL mobility.      Recommendations for follow up therapy are one component of a multi-disciplinary discharge planning process, led by the attending physician.  Recommendations may be updated based on patient status, additional functional criteria and insurance authorization.   Follow Up Recommendations  Skilled nursing-short term rehab (<3 hours/day)    Assistance Recommended at Discharge Frequent or constant Supervision/Assistance  Patient can return home with the following      Functional Status Assessment  Patient has had a recent decline in their functional status and demonstrates the ability to make significant improvements in function in a reasonable and predictable amount of time.   Equipment Recommendations  Other (comment) (defer to next level of care)    Recommendations for Other Services       Precautions / Restrictions Precautions Precautions: Fall;Posterior Hip Precaution Booklet Issued: Yes (comment) (HEP handout with paper re: posterior hip precautions & car transfer) Restrictions Weight Bearing Restrictions: Yes RLE Weight Bearing: Weight bearing as tolerated      Mobility Bed Mobility               General bed mobility comments: up to recliner pre/post    Transfers Overall transfer level: Needs assistance Equipment used: Rolling walker (2 wheels) Transfers: Sit to/from Stand Sit to Stand: Min assist           General transfer comment: cues for safe use of RW      Balance Overall balance assessment: Needs assistance Sitting-balance support: Feet supported, Bilateral upper extremity supported Sitting balance-Leahy Scale: Fair     Standing balance support: Bilateral upper extremity supported, During functional activity Standing balance-Leahy Scale: Fair                             ADL either performed or assessed with clinical judgement   ADL Overall ADL's : Needs assistance/impaired                                       General ADL Comments: SETUP for seated UB ADLs, MOD A for seated LB ADLs,     Vision Patient Visual Report: No  change from baseline       Perception     Praxis      Pertinent Vitals/Pain Pain Assessment Pain Assessment: 0-10 Pain Score: 7  Pain Location: R hip with movement/weight bearing Pain Descriptors / Indicators: Discomfort Pain Intervention(s): Monitored during session, Repositioned, RN gave pain meds during session     Hand Dominance     Extremity/Trunk Assessment Upper Extremity Assessment Upper Extremity Assessment: Generalized weakness   Lower Extremity Assessment Lower Extremity Assessment: Generalized weakness;RLE deficits/detail RLE Deficits /  Details: unable to full assess d/t precautions and pain       Communication Communication Communication: No difficulties   Cognition Arousal/Alertness: Awake/alert Behavior During Therapy: WFL for tasks assessed/performed Overall Cognitive Status: Within Functional Limits for tasks assessed                                 General Comments: Pt is oriented to place, self and situation, requires minimal cueing for time; demos need for increased processing/response time and requires moderate cueing for unfamiliar tasks such as use of AE.     General Comments  PT reviewed use of incentive spirometer with pt with poor return demo from pt as she attempts to exhale vs inhale & pt barely able to achieve 250-500 mL.    Exercises Other Exercises Other Exercises: OT ed re: compression stocking mgt, use of AE for LB ADLs including demo, safe use of RW, fall prevention, THPs.   Shoulder Instructions      Home Living Family/patient expects to be discharged to:: Private residence Living Arrangements: Spouse/significant other Available Help at Discharge: Family;Available 24 hours/day (husband can assist but is limited as he is visually impaired) Type of Home: Mobile home Home Access: Stairs to enter Entrance Stairs-Number of Steps: 5 Entrance Stairs-Rails: Right;Left;Can reach both Home Layout: One level               Home Equipment: Conservation officer, nature (2 wheels);Cane - quad          Prior Functioning/Environment Prior Level of Function : Independent/Modified Independent             Mobility Comments: Pt reports she's ambulatory with QD          OT Problem List: Decreased strength;Decreased range of motion;Decreased activity tolerance;Decreased knowledge of use of DME or AE;Pain      OT Treatment/Interventions: Self-care/ADL training;Therapeutic exercise;DME and/or AE instruction;Therapeutic activities;Patient/family education    OT Goals(Current goals can be  found in the care plan section) Acute Rehab OT Goals Patient Stated Goal: to get stronger OT Goal Formulation: With patient Time For Goal Achievement: 09/04/21 Potential to Achieve Goals: Good ADL Goals Pt Will Perform Lower Body Dressing: with supervision;with adaptive equipment Pt Will Transfer to Toilet: with supervision;ambulating Pt Will Perform Toileting - Clothing Manipulation and hygiene: with supervision;sit to/from stand  OT Frequency: Min 2X/week    Co-evaluation              AM-PAC OT "6 Clicks" Daily Activity     Outcome Measure Help from another person eating meals?: None Help from another person taking care of personal grooming?: A Little Help from another person toileting, which includes using toliet, bedpan, or urinal?: A Lot Help from another person bathing (including washing, rinsing, drying)?: A Lot Help from another person to put on and taking off regular upper body clothing?: A Little Help from another person to put on and  taking off regular lower body clothing?: A Lot 6 Click Score: 16   End of Session Equipment Utilized During Treatment: Gait belt;Rolling walker (2 wheels) Nurse Communication: Mobility status  Activity Tolerance: Patient tolerated treatment well Patient left: in chair;with call bell/phone within reach;with chair alarm set  OT Visit Diagnosis: Unsteadiness on feet (R26.81);Muscle weakness (generalized) (M62.81);Pain Pain - Right/Left: Right Pain - part of body: Hip                Time: 4584-8350 OT Time Calculation (min): 36 min Charges:  OT General Charges $OT Visit: 1 Visit OT Evaluation $OT Eval Moderate Complexity: 1 Mod OT Treatments $Self Care/Home Management : 8-22 mins $Therapeutic Activity: 8-22 mins  Gerrianne Scale, MS, OTR/L ascom [phone] 08/21/21, 2:04 PM

## 2021-08-21 NOTE — Assessment & Plan Note (Signed)
Will need podiatry as outpatient to cut these overgrown toenails.

## 2021-08-21 NOTE — Progress Notes (Signed)
Physical Therapy Treatment Patient Details Name: [name] [name] MRN: [id number] DOB: 03/30/1946 Today's Date: 08/21/2021   History of Present Illness Pt is a 76 y/o F admitted on 08/19/21 with c/c of fall & hitting her leg. Pt found to have : Displaced right femoral neck hip fracture, severe bilateral foot onychomycosis & pt underwent right hip hemiarthroplasty. Pt is WBAT with posterior hip precautions. PMH: osteoporosis, autoimmune hepatitis, HTN, GERD, chronic iron deficiency anemia, chronic ambulation dysfunction, HLD    PT Comments    Pt was sitting in recliner upon arriving. She is A and oriented x 3 but presents with flat affect. Was eager to get back to bed and was currently endorsing severe pain. She was able to stand form recliner with min-mod assist + increased time and vcs for improved technique. Pt did not recall any hip precautions but was able to adhere to them with vcs throughout. Pt took a few very shuffling steps towards EOB however has poor foot clearance throughout. She fatigued quickly and required author moving bed behind her to safely make it back to EOB. Pt was returned to supine (long sit) with max assist. Encouraged participation with there ex/HEP however pt unwilling. Pt will benefit from continued skilled PT going forward. Highly recommend DC to SNF to address deficits while maximizing independence with ADLs.   Recommendations for follow up therapy are one component of a multi-disciplinary discharge planning process, led by the attending physician.  Recommendations may be updated based on patient status, additional functional criteria and insurance authorization.  Follow Up Recommendations  Skilled nursing-short term rehab (<3 hours/day)     Assistance Recommended at Discharge Frequent or constant Supervision/Assistance  Patient can return home with the following A lot of help with walking and/or transfers;A lot of help with bathing/dressing/bathroom;Assist for  transportation;Assistance with cooking/housework;Direct supervision/assist for financial management;Direct supervision/assist for medications management;Help with stairs or ramp for entrance   Equipment Recommendations  None recommended by PT       Precautions / Restrictions Precautions Precautions: Fall;Posterior Hip Precaution Booklet Issued: Yes (comment) Restrictions Weight Bearing Restrictions: Yes RLE Weight Bearing: Weight bearing as tolerated     Mobility  Bed Mobility Overal bed mobility: Needs Assistance Bed Mobility: Sit to Supine     Sit to supine: Max assist   General bed mobility comments: Due to pain and weakness, pt required extensive assistance to return to bed    Transfers Overall transfer level: Needs assistance Equipment used: Rolling walker (2 wheels) Transfers: Sit to/from Stand Sit to Stand: Min assist, Mod assist           General transfer comment: pt required min-mod assist this afternoon to stand from recliner. vcs for handplacement and overall improved technique. increased time to perform all desired task.    Ambulation/Gait Ambulation/Gait assistance: Min assist Gait Distance (Feet): 5 Feet Assistive device: Rolling walker (2 wheels) Gait Pattern/deviations: Decreased step length - right, Decreased step length - left, Decreased stride length, Decreased dorsiflexion - right, Decreased dorsiflexion - left, Decreased weight shift to right Gait velocity: decreased     General Gait Details: Poor foot clearance BLE. shuffling steps. Eventually had to move bed behind pt due to fatigue and pain to prevent falling.    Balance Overall balance assessment: Needs assistance Sitting-balance support: Feet supported, Bilateral upper extremity supported Sitting balance-Leahy Scale: Fair     Standing balance support: Bilateral upper extremity supported, During functional activity Standing balance-Leahy Scale: Fair      Cognition Arousal/Alertness:  Awake/alert Behavior During Therapy: WFL for tasks assessed/performed Overall Cognitive Status: Within Functional Limits for tasks assessed    General Comments: Pt is A and oriented x 3. unable to recall hip precautions           General Comments General comments (skin integrity, edema, etc.): Pt was unwilling to perform HEP this session. " I think I've done enough today. can we not do the exercises till tomorrow." Overall session greatly limited by pain      Pertinent Vitals/Pain Pain Assessment Pain Assessment: 0-10 Pain Score: 8  Pain Location: R hip with movement/weight bearing Pain Descriptors / Indicators: Discomfort Pain Intervention(s): Limited activity within patient's tolerance, Monitored during session, Premedicated before session, Repositioned    Home Living Family/patient expects to be discharged to:: Private residence Living Arrangements: Spouse/significant other              PT Goals (current goals can now be found in the care plan section) Acute Rehab PT Goals Patient Stated Goal: rehab at DC before going home Progress towards PT goals: Progressing toward goals    Frequency    BID      PT Plan Current plan remains appropriate       AM-PAC PT "6 Clicks" Mobility   Outcome Measure  Help needed turning from your back to your side while in a flat bed without using bedrails?: A Little Help needed moving from lying on your back to sitting on the side of a flat bed without using bedrails?: A Lot Help needed moving to and from a bed to a chair (including a wheelchair)?: A Lot Help needed standing up from a chair using your arms (e.g., wheelchair or bedside chair)?: A Lot Help needed to walk in hospital room?: A Lot Help needed climbing 3-5 steps with a railing? : Total 6 Click Score: 12    End of Session Equipment Utilized During Treatment: Gait belt Activity Tolerance: Patient limited by pain;Patient limited by fatigue Patient left: in bed;with  call bell/phone within reach;with bed alarm set Nurse Communication: Mobility status;Precautions;Weight bearing status PT Visit Diagnosis: Unsteadiness on feet (R26.81);Muscle weakness (generalized) (M62.81);Difficulty in walking, not elsewhere classified (R26.2);Pain Pain - Right/Left: Right Pain - part of body: Hip     Time: 1450-1505 PT Time Calculation (min) (ACUTE ONLY): 15 min  Charges:  $Therapeutic Activity: 8-22 mins                     Jetta Lout PTA 08/21/21, 5:14 PM

## 2021-08-21 NOTE — Progress Notes (Signed)
Patient expressed to have urinated sometime today. No charted in flow sheets. Bladder scanned: Urine retention 175 mL.

## 2021-08-22 DIAGNOSIS — N179 Acute kidney failure, unspecified: Secondary | ICD-10-CM | POA: Diagnosis not present

## 2021-08-22 DIAGNOSIS — D696 Thrombocytopenia, unspecified: Secondary | ICD-10-CM

## 2021-08-22 DIAGNOSIS — D62 Acute posthemorrhagic anemia: Secondary | ICD-10-CM

## 2021-08-22 DIAGNOSIS — I10 Essential (primary) hypertension: Secondary | ICD-10-CM | POA: Diagnosis not present

## 2021-08-22 DIAGNOSIS — R338 Other retention of urine: Secondary | ICD-10-CM

## 2021-08-22 DIAGNOSIS — K754 Autoimmune hepatitis: Secondary | ICD-10-CM | POA: Diagnosis not present

## 2021-08-22 LAB — CBC
HCT: 28.4 % — ABNORMAL LOW (ref 36.0–46.0)
Hemoglobin: 9.2 g/dL — ABNORMAL LOW (ref 12.0–15.0)
MCH: 27.7 pg (ref 26.0–34.0)
MCHC: 32.4 g/dL (ref 30.0–36.0)
MCV: 85.5 fL (ref 80.0–100.0)
Platelets: 140 10*3/uL — ABNORMAL LOW (ref 150–400)
RBC: 3.32 MIL/uL — ABNORMAL LOW (ref 3.87–5.11)
RDW: 18.4 % — ABNORMAL HIGH (ref 11.5–15.5)
WBC: 8.4 10*3/uL (ref 4.0–10.5)
nRBC: 0 % (ref 0.0–0.2)

## 2021-08-22 LAB — BASIC METABOLIC PANEL
Anion gap: 6 (ref 5–15)
BUN: 25 mg/dL — ABNORMAL HIGH (ref 8–23)
CO2: 23 mmol/L (ref 22–32)
Calcium: 8.7 mg/dL — ABNORMAL LOW (ref 8.9–10.3)
Chloride: 103 mmol/L (ref 98–111)
Creatinine, Ser: 1.49 mg/dL — ABNORMAL HIGH (ref 0.44–1.00)
GFR, Estimated: 36 mL/min — ABNORMAL LOW (ref >60)
Glucose, Bld: 91 mg/dL (ref 70–99)
Potassium: 3.4 mmol/L — ABNORMAL LOW (ref 3.5–5.1)
Sodium: 132 mmol/L — ABNORMAL LOW (ref 135–145)

## 2021-08-22 MED ORDER — POTASSIUM CHLORIDE CRYS ER 20 MEQ PO TBCR
40.0000 meq | EXTENDED_RELEASE_TABLET | Freq: Once | ORAL | Status: AC
  Administered 2021-08-22: 40 meq via ORAL
  Filled 2021-08-22: qty 2

## 2021-08-22 MED ORDER — PREDNISONE 5 MG PO TABS
7.5000 mg | ORAL_TABLET | Freq: Every day | ORAL | Status: DC
  Administered 2021-08-23 – 2021-08-24 (×2): 7.5 mg via ORAL
  Filled 2021-08-22 (×2): qty 1

## 2021-08-22 NOTE — Progress Notes (Signed)
Physical Therapy Treatment Patient Details Name: [name] [name] MRN: [id number] DOB: 28-Jan-1946 Today's Date: 08/22/2021   History of Present Illness Pt is a 76 y/o F admitted on 08/19/21 with c/c of fall & hitting her leg. Pt found to have : Displaced right femoral neck hip fracture, severe bilateral foot onychomycosis & pt underwent right hip hemiarthroplasty. Pt is WBAT with posterior hip precautions. PMH: osteoporosis, autoimmune hepatitis, HTN, GERD, chronic iron deficiency anemia, chronic ambulation dysfunction, HLD    PT Comments    Participated in exercises as described below.  To EOB with increased time and mod a x 1.  She is able to stand and take very small "scooting" steps to left.  Seated rest before standing again to complete transfer to chair.  She is not able to progress gait away from bed and is generally fatigued with activity.  Remained in recliner after session with needs met.  Discussed with tech.   Recommendations for follow up therapy are one component of a multi-disciplinary discharge planning process, led by the attending physician.  Recommendations may be updated based on patient status, additional functional criteria and insurance authorization.  Follow Up Recommendations  Skilled nursing-short term rehab (<3 hours/day)     Assistance Recommended at Discharge Frequent or constant Supervision/Assistance  Patient can return home with the following A lot of help with walking and/or transfers;A lot of help with bathing/dressing/bathroom;Assist for transportation;Assistance with cooking/housework;Direct supervision/assist for financial management;Direct supervision/assist for medications management;Help with stairs or ramp for entrance   Equipment Recommendations  None recommended by PT    Recommendations for Other Services       Precautions / Restrictions Precautions Precautions: Fall;Posterior Hip Precaution Booklet Issued: Yes (comment) Restrictions Weight  Bearing Restrictions: Yes RLE Weight Bearing: Weight bearing as tolerated     Mobility  Bed Mobility Overal bed mobility: Needs Assistance       Supine to sit: Min assist          Transfers Overall transfer level: Needs assistance Equipment used: Rolling walker (2 wheels) Transfers: Sit to/from Stand Sit to Stand: Min assist, Mod assist   Step pivot transfers: Mod assist, Min assist       General transfer comment: increased time and cues    Ambulation/Gait Ambulation/Gait assistance: Min assist Gait Distance (Feet): 3 Feet Assistive device: Rolling walker (2 wheels) Gait Pattern/deviations: Decreased step length - right, Decreased step length - left, Decreased stride length, Decreased dorsiflexion - right, Decreased dorsiflexion - left, Decreased weight shift to right Gait velocity: decreased     General Gait Details: very small limted steps.  did not progress away from bed as she had difficulty sidestepping and turning to chair.   Stairs             Wheelchair Mobility    Modified Rankin (Stroke Patients Only)       Balance Overall balance assessment: Needs assistance Sitting-balance support: Feet supported, Bilateral upper extremity supported Sitting balance-Leahy Scale: Fair     Standing balance support: Bilateral upper extremity supported, During functional activity Standing balance-Leahy Scale: Fair                              Cognition Arousal/Alertness: Awake/alert Behavior During Therapy: WFL for tasks assessed/performed Overall Cognitive Status: Within Functional Limits for tasks assessed  Exercises General Exercises - Lower Extremity Ankle Circles/Pumps: AROM, Right, 10 reps, Supine (max mulitmodal cuing for technique) Quad Sets: AROM, Strengthening, Right, 10 reps, Supine (max mulitmodal cuing for technique) Gluteal Sets: AROM, Strengthening, Supine, 10 reps  (max mulitmodal cuing for technique) Short Arc Quad: AROM, Strengthening, Right, 10 reps, Supine (max mulitmodal cuing for technique) Long Arc Quad: AROM, Right, Strengthening, 10 reps, Seated Heel Slides: AAROM, Strengthening, Right, 10 reps, Supine Hip ABduction/ADduction: AROM, AAROM, Strengthening, Supine, 10 reps (hip abduction slides x 10 AAROM, hip adduction pillow squeezes x 10)    General Comments        Pertinent Vitals/Pain Pain Assessment Pain Assessment: Faces Faces Pain Scale: Hurts little more Pain Location: R hip with movement/weight bearing Pain Descriptors / Indicators: Discomfort Pain Intervention(s): Limited activity within patient's tolerance, Monitored during session, Premedicated before session, Repositioned    Home Living                          Prior Function            PT Goals (current goals can now be found in the care plan section) Progress towards PT goals: Progressing toward goals    Frequency    BID      PT Plan Current plan remains appropriate    Co-evaluation              AM-PAC PT "6 Clicks" Mobility   Outcome Measure  Help needed turning from your back to your side while in a flat bed without using bedrails?: A Little Help needed moving from lying on your back to sitting on the side of a flat bed without using bedrails?: A Lot Help needed moving to and from a bed to a chair (including a wheelchair)?: A Lot Help needed standing up from a chair using your arms (e.g., wheelchair or bedside chair)?: A Lot Help needed to walk in hospital room?: A Lot Help needed climbing 3-5 steps with a railing? : Total 6 Click Score: 12    End of Session Equipment Utilized During Treatment: Gait belt Activity Tolerance: Patient limited by fatigue;Patient tolerated treatment well Patient left: in chair;with call bell/phone within reach;with chair alarm set Nurse Communication: Mobility status;Precautions;Weight bearing status PT  Visit Diagnosis: Unsteadiness on feet (R26.81);Muscle weakness (generalized) (M62.81);Difficulty in walking, not elsewhere classified (R26.2);Pain Pain - Right/Left: Right Pain - part of body: Hip     Time: 9694-0982 PT Time Calculation (min) (ACUTE ONLY): 23 min  Charges:  $Therapeutic Exercise: 8-22 mins $Therapeutic Activity: 8-22 mins                    Chesley Noon, PTA 08/22/21, 1:32 PM

## 2021-08-22 NOTE — Progress Notes (Signed)
Cross Cover Urinary retention. Required in and out cath

## 2021-08-22 NOTE — Progress Notes (Signed)
°  Progress Note   Patient: [name] [name] [id number] DOB: 1945/09/01 DOA: 08/19/2021     3 DOS: the patient was seen and examined on 08/22/2021   Assessment and Plan: * Closed hip fracture requiring operative repair, right, sequela Status post right hip hemiarthroplasty on 08/20/2020.  Pain control. Physical therapy recommending rehab.  Acute postoperative anemia due to expected blood loss Hemoglobin dropped from 14.2 down to 9.2.  We will recheck tomorrow and evaluate.  Holding IV fluids at this point.  Acute kidney injury superimposed on CKD ([place]) Today the patient has acute kidney injury on chronic kidney disease stage IIIa.  Creatinine went up to 1.59.  Previous creatinine 1.13.  Today's creatinine 1.49.  We will continue to monitor.  Autoimmune hepatitis ([place]) Continue Imuran and prednisone.  We will change prednisone down to her usual dose for tomorrow.  Hypertension With blood pressure being on the lower side will hold off on Norvasc and continue metoprolol.  GERD (gastroesophageal reflux disease) Continue PPI  Nausea Resolved  Thrombocytopenia (HCC) Platelet count slightly lower today at 140.  Was in the normal range yesterday at 171.  Continue to monitor.  Acute urinary retention Required in and out catheterization last night.  Hopefully will be able to urinate.  Hyponatremia Sodium 132 today.    Toenail fungus Will need podiatry as outpatient to cut these overgrown toenails.        Subjective: Patient feels okay.  Offers no complaints.  Humphrey Rolls and out catheterization last night.  We will continue to monitor closely.  Postoperative day 2 hip fracture.  Physical Exam: Vitals:   08/21/21 1545 08/21/21 2008 08/22/21 0406 08/22/21 0827  BP: 93/66 112/73 131/79 124/80  Pulse: 68 66 75 76  Resp: 16 14 16 15   Temp: 97.6 F (36.4 C) 97.7 F (36.5 C) 97.6 F (36.4 C) 98 F (36.7 C)  TempSrc: Oral     SpO2: 96% 94% 93% 93%  Weight:      Height:       Physical  Exam HENT:     Head: Normocephalic.     Mouth/Throat:     Pharynx: No oropharyngeal exudate.  Eyes:     General: Lids are normal.     Conjunctiva/sclera: Conjunctivae normal.  Cardiovascular:     Rate and Rhythm: Normal rate and regular rhythm.     Heart sounds: Normal heart sounds, S1 normal and S2 normal.  Pulmonary:     Breath sounds: No decreased breath sounds, wheezing, rhonchi or rales.  Abdominal:     Palpations: Abdomen is soft.     Tenderness: There is no abdominal tenderness.  Musculoskeletal:     Right lower leg: No swelling.     Left lower leg: No swelling.     Comments: Right leg shortened  Skin:    General: Skin is warm.     Findings: No rash.     Comments: Overgrown toenails. Bruising upper left lip  Neurological:     Mental Status: She is alert.     Comments: Answers questions appropriately     Data Reviewed: Hemoglobin dipped down to 9.2, platelet count dipped down to 140.  Creatinine only slightly improved to 1.49.  Sodium slightly improved to 132  Family Communication: Spoke with husband on the phone  Disposition: Status is: Inpatient Remains inpatient appropriate because: Postoperative day 2 hemiarthroplasty  Planned Discharge Destination: Rehab  Author: Loletha Grayer, MD 08/22/2021 11:24 AM  For on call review [email or web address].

## 2021-08-22 NOTE — Progress Notes (Signed)
°  Subjective:  Patient reports pain as mild.  Sitting up in chair.  Objective:   VITALS:   Vitals:   08/21/21 1545 08/21/21 2008 08/22/21 0406 08/22/21 0827  BP: 93/66 112/73 131/79 124/80  Pulse: 68 66 75 76  Resp: 16 14 16 15   Temp: 97.6 F (36.4 C) 97.7 F (36.5 C) 97.6 F (36.4 C) 98 F (36.7 C)  TempSrc: Oral     SpO2: 96% 94% 93% 93%  Weight:      Height:        PHYSICAL EXAM:  Sensation intact distally Dorsiflexion/Plantar flexion intact Incision: dressing C/D/I No cellulitis present Compartment soft  LABS  Results for orders placed or performed during the hospital encounter of 08/19/21 (from the past 24 hour(s))  CBC     Status: Abnormal   Collection Time: 08/22/21  5:06 AM  Result Value Ref Range   WBC 8.4 4.0 - 10.5 K/uL   RBC 3.32 (L) 3.87 - 5.11 MIL/uL   Hemoglobin 9.2 (L) 12.0 - 15.0 g/dL   HCT 10/20/21 (L) 09.3 - 23.5 %   MCV 85.5 80.0 - 100.0 fL   MCH 27.7 26.0 - 34.0 pg   MCHC 32.4 30.0 - 36.0 g/dL   RDW 57.3 (H) 22.0 - 25.4 %   Platelets 140 (L) 150 - 400 K/uL   nRBC 0.0 0.0 - 0.2 %  Basic metabolic panel     Status: Abnormal   Collection Time: 08/22/21  5:06 AM  Result Value Ref Range   Sodium 132 (L) 135 - 145 mmol/L   Potassium 3.4 (L) 3.5 - 5.1 mmol/L   Chloride 103 98 - 111 mmol/L   CO2 23 22 - 32 mmol/L   Glucose, Bld 91 70 - 99 mg/dL   BUN 25 (H) 8 - 23 mg/dL   Creatinine, Ser 10/20/21 (H) 0.44 - 1.00 mg/dL   Calcium 8.7 (L) 8.9 - 10.3 mg/dL   GFR, Estimated 36 (L) >60 mL/min   Anion gap 6 5 - 15    DG Hip Port Unilat With Pelvis 1V Right  Result Date: 08/20/2021 CLINICAL DATA:  Right hip replacement EXAM: DG HIP (WITH OR WITHOUT PELVIS) 1V PORT RIGHT COMPARISON:  None. FINDINGS: Generalized osteopenia. Interval right hip arthroplasty without hardware failure or complication. Normal alignment. Postsurgical changes in the surrounding soft tissues. Nondisplaced fracture of the right inferior pubic ramus. Otherwise, no acute fracture or  dislocation. IMPRESSION: 1. Interval right hip arthroplasty. Electronically Signed   By: 10/18/2021 M.D.   On: 08/20/2021 13:02    Assessment/Plan: 2 Days Post-Op   Principal Problem:   Closed hip fracture requiring operative repair, right, sequela Active Problems:   Autoimmune hepatitis (HCC)   Nausea   Hypertension   GERD (gastroesophageal reflux disease)   Acute kidney injury superimposed on CKD (HCC)   Toenail fungus   Hyponatremia   Acute urinary retention   Acute postoperative anemia due to expected blood loss   Thrombocytopenia (HCC)   Up with therapy  Lovenox for DVT prophylaxis  Patient will need a skilled nursing facility upon discharge.   10/18/2021 , MD 08/22/2021, 11:36 AM

## 2021-08-22 NOTE — Plan of Care (Signed)

## 2021-08-22 NOTE — NC FL2 (Signed)
[place] MEDICAID FL2 LEVEL OF CARE SCREENING TOOL     IDENTIFICATION  Patient Name: [name] [name] Birthdate: [birth year] Sex: female Admission Date (Current Location): 08/19/2021  [facility] and [place] Number:  Chiropodist and Address:  [facility], [address] [place], [place] [address]      Provider Number: [id number]  Attending Physician Name and Address:  Alford Highland, MD  Relative Name and Phone Number:  Jeffreys,Cornelius (Spouse)   [phone] (Home Phone)    Current Level of Care: Hospital Recommended Level of Care: Skilled Nursing Facility Prior Approval Number:    Date Approved/Denied:   PASRR Number: [id number]  Discharge Plan:      Current Diagnoses: Patient Active Problem List   Diagnosis Date Noted   Acute urinary retention 08/22/2021   Acute kidney injury superimposed on CKD (HCC) 08/21/2021   Toenail fungus 08/21/2021   Hyponatremia 08/21/2021   Nausea 08/20/2021   Hypertension    GERD (gastroesophageal reflux disease)    Closed hip fracture requiring operative repair, right, sequela 08/19/2021   Autoimmune hepatitis (HCC) 08/19/2021   Iron deficiency anemia due to chronic blood loss 05/27/2021   Abnormal LFTs 02/20/2017    Orientation RESPIRATION BLADDER Height & Weight     Self, Time, Situation, Place  Normal External catheter Weight: 127 lb (57.6 kg) Height:  5\' 3"  (160 cm)  BEHAVIORAL SYMPTOMS/MOOD NEUROLOGICAL BOWEL NUTRITION STATUS        Diet (regular)  AMBULATORY STATUS COMMUNICATION OF NEEDS Skin   Limited Assist Verbally Surgical wounds (right hip)                       Personal Care Assistance Level of Assistance  Bathing, Feeding, Dressing Bathing Assistance: Limited assistance Feeding assistance: Limited assistance Dressing Assistance: Limited assistance     Functional Limitations Info             SPECIAL CARE FACTORS FREQUENCY  PT (By licensed PT), OT (By licensed OT)      PT Frequency: 5 times per week OT Frequency: 5 times per week            Contractures      Additional Factors Info  Code Status, Allergies Code Status Info: full Allergies Info: sulfa antibiotics, sulfasalazine           Current Medications (08/22/2021):  This is the current hospital active medication list Current Facility-Administered Medications  Medication Dose Route Frequency Provider Last Rate Last Admin   acetaminophen (TYLENOL) tablet 325-650 mg  325-650 mg Oral Q6H PRN 10/20/2021, MD       alum & mag hydroxide-simeth (MAALOX/MYLANTA) 200-200-20 MG/5ML suspension 30 mL  30 mL Oral Q4H PRN 01-24-2001, MD       aspirin EC tablet 81 mg  81 mg Oral Daily Juanell Fairly, MD   81 mg at 08/22/21 1008   azaTHIOprine (IMURAN) tablet 25 mg  25 mg Oral Daily 10/20/21, MD   25 mg at 08/22/21 1007   bisacodyl (DULCOLAX) suppository 10 mg  10 mg Rectal Daily PRN 10/20/21, MD       calcium carbonate (TUMS - dosed in mg elemental calcium) chewable tablet 500 mg  500 mg Oral Daily Juanell Fairly, MD   500 mg at 08/22/21 1019   Chlorhexidine Gluconate Cloth 2 % PADS 6 each  6 each Topical Daily 10/20/21, MD   6 each at 08/22/21 1021   docusate sodium (COLACE) capsule  100 mg  100 mg Oral BID Juanell Fairly, MD   100 mg at 08/22/21 1008   enoxaparin (LOVENOX) injection 30 mg  30 mg Subcutaneous Q24H Juanell Fairly, MD   30 mg at 08/22/21 4765   feeding supplement (ENSURE ENLIVE / ENSURE PLUS) liquid 237 mL  237 mL Oral BID BM Juanell Fairly, MD   237 mL at 08/22/21 1006   Ferrous Fumarate (HEMOCYTE - 106 mg FE) tablet 106 mg of iron  1 tablet Oral Daily Juanell Fairly, MD   106 mg of iron at 08/22/21 1008   HYDROcodone-acetaminophen (NORCO/VICODIN) 5-325 MG per tablet 1-2 tablet  1-2 tablet Oral Q4H PRN Juanell Fairly, MD   1 tablet at 08/22/21 0757   menthol-cetylpyridinium (CEPACOL) lozenge 3 mg  1 lozenge Oral PRN Juanell Fairly, MD        Or   phenol (CHLORASEPTIC) mouth spray 1 spray  1 spray Mouth/Throat PRN Juanell Fairly, MD       methocarbamol (ROBAXIN) tablet 500 mg  500 mg Oral Q6H PRN Juanell Fairly, MD       Or   methocarbamol (ROBAXIN) 500 mg in dextrose 5 % 50 mL IVPB  500 mg Intravenous Q6H PRN Juanell Fairly, MD       metoprolol tartrate (LOPRESSOR) tablet 50 mg  50 mg Oral Daily Juanell Fairly, MD   50 mg at 08/22/21 1008   morphine (PF) 2 MG/ML injection 0.5-1 mg  0.5-1 mg Intravenous Q2H PRN Juanell Fairly, MD       multivitamin with minerals tablet 1 tablet  1 tablet Oral Daily Juanell Fairly, MD   1 tablet at 08/22/21 1006   ondansetron (ZOFRAN) tablet 4 mg  4 mg Oral Q6H PRN Juanell Fairly, MD   4 mg at 08/21/21 4650   Or   ondansetron (ZOFRAN) injection 4 mg  4 mg Intravenous Q6H PRN Juanell Fairly, MD       pantoprazole (PROTONIX) EC tablet 40 mg  40 mg Oral Daily Juanell Fairly, MD   40 mg at 08/22/21 1009   polyethylene glycol (MIRALAX / GLYCOLAX) packet 17 g  17 g Oral Daily PRN Juanell Fairly, MD   17 g at 08/21/21 0840   potassium chloride SA (KLOR-CON M) CR tablet 40 mEq  40 mEq Oral Once Alford Highland, MD       Melene Muller ON 08/23/2021] predniSONE (DELTASONE) tablet 7.5 mg  7.5 mg Oral Q breakfast Wieting, Richard, MD       senna Mancel Parsons) tablet 8.6 mg  1 tablet Oral BID Juanell Fairly, MD   8.6 mg at 08/22/21 1009   senna-docusate (Senokot-S) tablet 1 tablet  1 tablet Oral QHS PRN Juanell Fairly, MD       traMADol Janean Sark) tablet 50 mg  50 mg Oral Q6H Juanell Fairly, MD   50 mg at 08/22/21 3546     Discharge Medications: Please see discharge summary for a list of discharge medications.  Relevant Imaging Results:  Relevant Lab Results:   Additional Information SS #: [id number]  Anahlia Iseminger E Tru Leopard, LCSW

## 2021-08-22 NOTE — Assessment & Plan Note (Addendum)
Hemoglobin dropped from 14.2 down to 9.2.  Hemoglobin stabilized at 9.5.

## 2021-08-22 NOTE — Assessment & Plan Note (Addendum)
Platelet count 170 today.  This has resolved.

## 2021-08-22 NOTE — Assessment & Plan Note (Addendum)
Patient required in and out catheterizations for the last 2 days.  After trying to urinate today bladder had 730 mL.  Will place Foley catheter.  Can follow-up with urology for voiding trial or can try and the facility in 1 week.

## 2021-08-22 NOTE — TOC Initial Note (Signed)
Transition of Care [facility]) - Initial/Assessment Note    Patient Details  Name: [name] [name] MRN: [id number] Date of Birth: Dec 30, 1945  [facility]) CM/SW Contact:    [name] [name] [place], [place] Phone Number: [phone] 08/22/2021, 10:08 AM  Clinical Narrative:                 Met with patient at bedside to discuss SNF recommendation post discharge. Patient states that she resides with her spouse, he is her main caregiver. However due to his sight issues, she is agreeable to a rehab stay post discharge to get a little stronger before her return home. Per patient, she has other family support but her husband is the  main support. Patient has a cain in the home and is followed by Dr. Berniece [name] , [facility] Patient has no other services in the home. Patient's first choice for rehab is [facility], if there is a bed available. SNF process explained , FL2 and [place] to be completed and faxed out for potential bed offers.  [name] [name] [address] LCSW Transition of Care [phone]   Expected Discharge Plan: [place] Barriers to Discharge: Continued Medical Work up   Patient Goals and CMS Choice Patient states their goals for this hospitalization and ongoing recovery are:: "I want to get stronger" CMS Medicare.gov Compare Post Acute Care list provided to:: Patient Choice offered to / list presented to : Patient  Expected Discharge Plan and Services Expected Discharge Plan: [place] In-house Referral: Clinical Social Work   Post Acute Care Choice: [place] Living arrangements for the past 2 months: Mobile Home                                      Prior Living Arrangements/Services Living arrangements for the past 2 months: Mobile Home Lives with:: Spouse Patient language and need for interpreter reviewed:: Yes Do you feel safe going back to the place where you live?: Yes      Need  for Family Participation in Patient Care: Yes (Comment) Care giver support system in place?: Yes (comment) Current home services: Other (comment) (Has a walker) Criminal Activity/Legal Involvement Pertinent to Current Situation/Hospitalization: No - Comment as needed  Activities of Daily Living      Permission Sought/Granted   Permission granted to share information with : Yes, Verbal Permission Granted        Permission granted to share info w Relationship: [name] [name] [phone]  Permission granted to share info w Contact Information: [phone]  Emotional Assessment Appearance:: Appears stated age Attitude/Demeanor/Rapport: Engaged Affect (typically observed): Accepting, Adaptable Orientation: : Oriented to Self, Oriented to Place, Oriented to  Time, Oriented to Situation Alcohol / Substance Use: Not Applicable Psych Involvement: No (comment)  Admission diagnosis:  Hip fracture ([place]) [S72.009A] Patient Active Problem List   Diagnosis Date Noted   Acute kidney injury superimposed on CKD ([place]) 08/21/2021   Toenail fungus 08/21/2021   Hyponatremia 08/21/2021   Nausea 08/20/2021   Hypertension    GERD (gastroesophageal reflux disease)    Closed hip fracture requiring operative repair, right, sequela 08/19/2021   Autoimmune hepatitis ([place]) 08/19/2021   Iron deficiency anemia due to chronic blood loss 05/27/2021   Abnormal LFTs 02/20/2017   PCP:  [name] Maltese, MD [id number] [name] [address] Phone: [phone] Fax: [phone]     Social Determinants of Health (SDOH) Interventions    Readmission Risk Interventions No flowsheet data found.

## 2021-08-23 DIAGNOSIS — K754 Autoimmune hepatitis: Secondary | ICD-10-CM | POA: Diagnosis not present

## 2021-08-23 DIAGNOSIS — I10 Essential (primary) hypertension: Secondary | ICD-10-CM | POA: Diagnosis not present

## 2021-08-23 DIAGNOSIS — N179 Acute kidney failure, unspecified: Secondary | ICD-10-CM | POA: Diagnosis not present

## 2021-08-23 DIAGNOSIS — D62 Acute posthemorrhagic anemia: Secondary | ICD-10-CM | POA: Diagnosis not present

## 2021-08-23 LAB — RESP PANEL BY RT-PCR (FLU A&B, COVID) ARPGX2
Influenza A by PCR: NEGATIVE
Influenza B by PCR: NEGATIVE
SARS Coronavirus 2 by RT PCR: NEGATIVE

## 2021-08-23 LAB — CBC
HCT: 30.6 % — ABNORMAL LOW (ref 36.0–46.0)
Hemoglobin: 9.5 g/dL — ABNORMAL LOW (ref 12.0–15.0)
MCH: 27.7 pg (ref 26.0–34.0)
MCHC: 31 g/dL (ref 30.0–36.0)
MCV: 89.2 fL (ref 80.0–100.0)
Platelets: 170 10*3/uL (ref 150–400)
RBC: 3.43 MIL/uL — ABNORMAL LOW (ref 3.87–5.11)
RDW: 18.5 % — ABNORMAL HIGH (ref 11.5–15.5)
WBC: 8.9 10*3/uL (ref 4.0–10.5)
nRBC: 0.2 % (ref 0.0–0.2)

## 2021-08-23 LAB — BASIC METABOLIC PANEL
Anion gap: 8 (ref 5–15)
BUN: 29 mg/dL — ABNORMAL HIGH (ref 8–23)
CO2: 23 mmol/L (ref 22–32)
Calcium: 9.5 mg/dL (ref 8.9–10.3)
Chloride: 103 mmol/L (ref 98–111)
Creatinine, Ser: 1.3 mg/dL — ABNORMAL HIGH (ref 0.44–1.00)
GFR, Estimated: 43 mL/min — ABNORMAL LOW (ref >60)
Glucose, Bld: 87 mg/dL (ref 70–99)
Potassium: 4.2 mmol/L (ref 3.5–5.1)
Sodium: 134 mmol/L — ABNORMAL LOW (ref 135–145)

## 2021-08-23 LAB — TYPE AND SCREEN
ABO/RH(D): A POS
Antibody Screen: NEGATIVE

## 2021-08-23 MED ORDER — SODIUM CHLORIDE 0.9 % IV BOLUS
500.0000 mL | Freq: Once | INTRAVENOUS | Status: AC
  Administered 2021-08-23: 500 mL via INTRAVENOUS

## 2021-08-23 MED ORDER — BETHANECHOL CHLORIDE 25 MG PO TABS
25.0000 mg | ORAL_TABLET | ORAL | Status: AC
  Administered 2021-08-23: 25 mg via ORAL
  Filled 2021-08-23 (×2): qty 1

## 2021-08-23 MED ORDER — BETHANECHOL CHLORIDE 10 MG PO TABS
10.0000 mg | ORAL_TABLET | Freq: Three times a day (TID) | ORAL | Status: DC
  Administered 2021-08-23 – 2021-08-24 (×2): 10 mg via ORAL
  Filled 2021-08-23 (×4): qty 1

## 2021-08-23 NOTE — TOC Progression Note (Signed)
Transition of Care [facility]) - Progression Note    Patient Details  Name: [name] [name] MRN: [id number] Date of Birth: Sep 14, 1945  Transition of Care [facility]) CM/SW Contact  Marlowe Sax, RN Phone Number: 08/23/2021, 3:37 PM  Clinical Narrative:   Reviewed the bed offers with the patient to go to STR SNF, She [facility], I notified Stanton Kidney of the choice, I uploaded the Ins clinical documents to get ins approval    Expected Discharge Plan: Skilled Nursing Facility Barriers to Discharge: Continued Medical Work up  Expected Discharge Plan and Services Expected Discharge Plan: Skilled Nursing Facility In-house Referral: Clinical Social Work   Post Acute Care Choice: Skilled Nursing Facility Living arrangements for the past 2 months: Mobile Home                                       Social Determinants of Health (SDOH) Interventions    Readmission Risk Interventions No flowsheet data found.

## 2021-08-23 NOTE — Progress Notes (Signed)
Pt unable to void for more than 16 hrs. Bladder scan shown more than 300 cc in bladder. Paged provider and order received for in and out cath. Patient tolerated I and out cath well. Output 500 cc clear amber urine.

## 2021-08-23 NOTE — Progress Notes (Signed)
Occupational Therapy Treatment Patient Details Name: [name] [name] MRN: [id number] DOB: [birth year] Today's Date: 08/23/2021   History of present illness Pt is a 76 y/o F admitted on 08/19/21 with c/c of fall & hitting her leg. Pt found to have : Displaced right femoral neck hip fracture, severe bilateral foot onychomycosis & pt underwent right hip hemiarthroplasty. Pt is WBAT with posterior hip precautions. PMH: osteoporosis, autoimmune hepatitis, HTN, GERD, chronic iron deficiency anemia, chronic ambulation dysfunction, HLD   OT comments  Pt agreeable to OOB activity this morning, working toward chair in prep for breakfast set up.  Pt had incontinence of BM in the bed, seemed to be unaware of this.  OT assisted with transfer to [facility] to try and void further.  Pt had another small BM but was unable to urinate; notified NT and MD present outside of room as pt had been retaining urine.  Performed pericare in standing with total assist as pt required 2 hands on walker for support in standing.  Extra time for steps from bed to commode and commode to chair d/t pt sliding feet d/t limited tolerance for WB to RLE.  Another sit to stand attempt from recliner to allow application of new sacral pad d/t previous soiled from stool.  Ice applied to R hip at end of session.  OT reviewed hip precautions, with pt recalling 1 of 3.  Reinforced precautions throughout session, primarily reminding pt to keep toes forward while standing and turning.  Overall min A for transfers and functional mobility, but distance limited by pain, and extensive time needed d/t pain.  Left in chair with chair alarm on and breakfast set up, phone and call light in reach with all needs met.  Pt will continue to benefit from acute OT for ADL and functional transfer training.  Continue to recommend SNF.   Recommendations for follow up therapy are one component of a multi-disciplinary discharge planning process, led by the attending physician.   Recommendations may be updated based on patient status, additional functional criteria and insurance authorization.    Follow Up Recommendations  Skilled nursing-short term rehab (<3 hours/day)    Assistance Recommended at Discharge Frequent or constant Supervision/Assistance  Patient can return home with the following  A lot of help with walking and/or transfers;A lot of help with bathing/dressing/bathroom;Assistance with cooking/housework;Assist for transportation;Help with stairs or ramp for entrance   Equipment Recommendations  Other (comment) (defer to next venue of care)    Recommendations for Other Services      Precautions / Restrictions Precautions Precautions: Fall;Posterior Hip Precaution Booklet Issued: Yes (comment) Precaution Comments: reviewed with pt during session; pt able to recall 1 of 3 and requiring constant cueing to keep toes forward when turning in standing Restrictions Weight Bearing Restrictions: Yes RLE Weight Bearing: Weight bearing as tolerated       Mobility Bed Mobility Overal bed mobility: Needs Assistance       Supine to sit: Min assist       Patient Response: Cooperative  Transfers Overall transfer level: Needs assistance Equipment used: Rolling walker (2 wheels) Transfers: Bed to chair/wheelchair/BSC, Sit to/from Stand Sit to Stand: Min assist Stand pivot transfers: Min assist   Step pivot transfers: Min assist     General transfer comment: increased time and cues     Balance Overall balance assessment: Needs assistance Sitting-balance support: Feet supported, Bilateral upper extremity supported Sitting balance-Leahy Scale: Fair Sitting balance - Comments: supervision static sitting   Standing balance support:  Bilateral upper extremity supported, During functional activity Standing balance-Leahy Scale: Poor               High level balance activites: Direction changes, Side stepping High Level Balance Comments: extra  time and max vc to keep toes pointing forward           ADL either performed or assessed with clinical judgement   ADL Overall ADL's : Needs assistance/impaired Eating/Feeding: Set up;Sitting                       Toilet Transfer: Minimal assistance;Stand-pivot;BSC/3in1;Rolling walker (2 wheels) Toilet Transfer Details (indicate cue type and reason): extensive time to pivot to commode d/t pain and pt sliding LLE forward d/t limited tolerance for WB to RLE Toileting- Clothing Manipulation and Hygiene: Total assistance;Sit to/from stand Toileting - Clothing Manipulation Details (indicate cue type and reason): Pt tolerated standing with BUE support on walker; OT performed hygiene after pt had had BM in the bed; extensive clean up as BM had soiled sacral pad and pt did not feel confident to attempt to help with cleaning in standing.  NT assisted with application of new pad while OT providing min A for standing.     Functional mobility during ADLs: Minimal assistance;Rolling walker (2 wheels) General ADL Comments: Pt shuffled feet from EOB to [facility], then BSC to chair in front of her.  Min A for balance.  Pt required max vc to keep toes pointed forward to maintain hip precautions while turning.  Extra time needed as pt poorly tolerated lifting L foot up to step forward or backward with the extra weight on the RLE.    Extremity/Trunk Assessment Upper Extremity Assessment Upper Extremity Assessment: Generalized weakness   Lower Extremity Assessment Lower Extremity Assessment: Defer to PT evaluation        Vision Patient Visual Report: No change from baseline                Cognition Arousal/Alertness: Awake/alert Behavior During Therapy: WFL for tasks assessed/performed Overall Cognitive Status: Within Functional Limits for tasks assessed                                 General Comments: Pt is A and oriented x 3. able to recall 1 hip precaution         Exercises             General Comments changed sacral pad d/t incontinence of BM in the bed; OT assisted with pericare in standing (sit<>stand at [facility])    Pertinent Vitals/ Pain       Pain Assessment Pain Score: 7  Pain Location: R hip with movement/weight bearing Pain Descriptors / Indicators: Discomfort Pain Intervention(s): Limited activity within patient's tolerance, Monitored during session, Repositioned, Ice applied, Other (comment) (RN had given pain meds prior to session)                                                          Frequency  Min 2X/week        Progress Toward Goals  OT Goals(current goals can now be found in the care plan section)  Progress towards OT goals: Progressing toward goals  Acute Rehab OT Goals Patient  Stated Goal: to get stronger OT Goal Formulation: With patient Time For Goal Achievement: 09/04/21 Potential to Achieve Goals: Good  Plan Discharge plan remains appropriate                     AM-PAC OT "6 Clicks" Daily Activity     Outcome Measure   Help from another person eating meals?: None Help from another person taking care of personal grooming?: A Little Help from another person toileting, which includes using toliet, bedpan, or urinal?: A Lot Help from another person bathing (including washing, rinsing, drying)?: A Lot Help from another person to put on and taking off regular upper body clothing?: A Little Help from another person to put on and taking off regular lower body clothing?: A Lot 6 Click Score: 16    End of Session Equipment Utilized During Treatment: Gait belt;Rolling walker (2 wheels)  OT Visit Diagnosis: Unsteadiness on feet (R26.81);Muscle weakness (generalized) (M62.81);Pain Pain - Right/Left: Right Pain - part of body: Hip   Activity Tolerance Patient limited by pain   Patient Left in chair;with call bell/phone within reach;with chair alarm set   Nurse Communication  Mobility status        Time: [phone] OT Time Calculation (min): 38 min  Charges: OT General Charges $OT Visit: 1 Visit OT Treatments $Self Care/Home Management : 38-52 mins  Leta Speller, MS, OTR/L   Darleene Cleaver 08/23/2021, 1:39 PM

## 2021-08-23 NOTE — TOC Progression Note (Signed)
Transition of Care [facility]) - Progression Note    Patient Details  Name: [name] [name] MRN: [id number] Date of Birth: 09-11-1945  Transition of Care [facility]) CM/SW Contact  Marlowe Sax, RN Phone Number: 08/23/2021, 10:57 AM  Clinical Narrative:   resent bedsearch    Expected Discharge Plan: Skilled Nursing Facility Barriers to Discharge: Continued Medical Work up  Expected Discharge Plan and Services Expected Discharge Plan: Skilled Nursing Facility In-house Referral: Clinical Social Work   Post Acute Care Choice: Skilled Nursing Facility Living arrangements for the past 2 months: Mobile Home                                       Social Determinants of Health (SDOH) Interventions    Readmission Risk Interventions No flowsheet data found.

## 2021-08-23 NOTE — Progress Notes (Signed)
°  Subjective:  POD #3 s/p right hip hemiarthroplasty.   Patient reports right hip pain as mild.  Patient sitting up in the chair eating lunch.  Objective:   VITALS:   Vitals:   08/22/21 2023 08/23/21 0414 08/23/21 0747 08/23/21 1134  BP: 135/81 127/87 134/86 96/60  Pulse: 72 91 88 73  Resp: 16 16 20 18   Temp: 97.9 F (36.6 C) 97.8 F (36.6 C) 97.9 F (36.6 C) (!) 97.5 F (36.4 C)  TempSrc:      SpO2: 94% 92% 92% 95%  Weight:      Height:        PHYSICAL EXAM: Right lower extremity Neurovascular intact Sensation intact distally Intact pulses distally Dorsiflexion/Plantar flexion intact Aquacel dressing with scant drainage No cellulitis present Compartment soft  LABS  Results for orders placed or performed during the hospital encounter of 08/19/21 (from the past 24 hour(s))  CBC     Status: Abnormal   Collection Time: 08/23/21  6:12 AM  Result Value Ref Range   WBC 8.9 4.0 - 10.5 K/uL   RBC 3.43 (L) 3.87 - 5.11 MIL/uL   Hemoglobin 9.5 (L) 12.0 - 15.0 g/dL   HCT 30.6 (L) 36.0 - 46.0 %   MCV 89.2 80.0 - 100.0 fL   MCH 27.7 26.0 - 34.0 pg   MCHC 31.0 30.0 - 36.0 g/dL   RDW 18.5 (H) 11.5 - 15.5 %   Platelets 170 150 - 400 K/uL   nRBC 0.2 0.0 - 0.2 %  Basic metabolic panel     Status: Abnormal   Collection Time: 08/23/21  6:12 AM  Result Value Ref Range   Sodium 134 (L) 135 - 145 mmol/L   Potassium 4.2 3.5 - 5.1 mmol/L   Chloride 103 98 - 111 mmol/L   CO2 23 22 - 32 mmol/L   Glucose, Bld 87 70 - 99 mg/dL   BUN 29 (H) 8 - 23 mg/dL   Creatinine, Ser 1.30 (H) 0.44 - 1.00 mg/dL   Calcium 9.5 8.9 - 10.3 mg/dL   GFR, Estimated 43 (L) >60 mL/min   Anion gap 8 5 - 15  Type and screen [place]     Status: None   Collection Time: 08/23/21  6:12 AM  Result Value Ref Range   ABO/RH(D) A POS    Antibody Screen NEG    Sample Expiration      08/26/2021,2359 Performed at [facility], [address] [place], [place] [address]     No  results found.  Assessment/Plan: 3 Days Post-Op   Principal Problem:   Closed hip fracture requiring operative repair, right, sequela Active Problems:   Autoimmune hepatitis (HCC)   Nausea   Hypertension   GERD (gastroesophageal reflux disease)   Acute kidney injury superimposed on CKD (HCC)   Toenail fungus   Hyponatremia   Acute urinary retention   Acute postoperative anemia due to expected blood loss   Thrombocytopenia (HCC)  Patient stable postop.  Hemoglobin and hematocrit are stable.  Continue physical therapy.  Patient will need a skilled nursing facility upon discharge.  Continue Lovenox daily for DVT prophylaxis.    Thornton Park , MD 08/23/2021, 2:06 PM

## 2021-08-23 NOTE — Progress Notes (Signed)
Physical Therapy Treatment Patient Details Name: [name] [name] MRN: [id number] DOB: 1946-02-23 Today's Date: 08/23/2021   History of Present Illness Pt is a 76 y/o F admitted on 08/19/21 with c/c of fall & hitting her leg. Pt found to have : Displaced right femoral neck hip fracture, severe bilateral foot onychomycosis & pt underwent right hip hemiarthroplasty. Pt is WBAT with posterior hip precautions. PMH: osteoporosis, autoimmune hepatitis, HTN, GERD, chronic iron deficiency anemia, chronic ambulation dysfunction, HLD    PT Comments    Pt in recliner for about 6 hours today and ready to get back to bed.  Stands and is able to transfer with RW and min a x 1 but needs mod a x 1 once fully turned due to sitting quickly on the bed.  Overall transfers are improving but gait remains limited.  She is assisted back to bed and generally fatigued with activities today.   Recommendations for follow up therapy are one component of a multi-disciplinary discharge planning process, led by the attending physician.  Recommendations may be updated based on patient status, additional functional criteria and insurance authorization.  Follow Up Recommendations  Skilled nursing-short term rehab (<3 hours/day)     Assistance Recommended at Discharge Frequent or constant Supervision/Assistance  Patient can return home with the following A lot of help with walking and/or transfers;A lot of help with bathing/dressing/bathroom;Assist for transportation;Assistance with cooking/housework;Direct supervision/assist for financial management;Direct supervision/assist for medications management;Help with stairs or ramp for entrance   Equipment Recommendations  None recommended by PT    Recommendations for Other Services       Precautions / Restrictions Precautions Precautions: Fall;Posterior Hip Precaution Booklet Issued: Yes (comment) Precaution Comments: reviewed with pt during session; pt able to recall 1 of 3 and  requiring constant cueing to keep toes forward when turning in standing Restrictions Weight Bearing Restrictions: Yes RLE Weight Bearing: Weight bearing as tolerated     Mobility  Bed Mobility Overal bed mobility: Needs Assistance Bed Mobility: Sit to Supine       Sit to supine: Mod assist   General bed mobility comments: in reclienr before and after    Transfers Overall transfer level: Needs assistance Equipment used: Rolling walker (2 wheels) Transfers: Sit to/from Stand Sit to Stand: Min assist Stand pivot transfers: Min assist, Mod assist Step pivot transfers: Min assist       General transfer comment: increased time and cues    Ambulation/Gait Ambulation/Gait assistance: Min assist Gait Distance (Feet): 2 Feet Assistive device: Rolling walker (2 wheels) Gait Pattern/deviations: Decreased step length - right, Decreased step length - left, Decreased stride length, Decreased dorsiflexion - right, Decreased dorsiflexion - left, Decreased weight shift to right Gait velocity: decreased     General Gait Details: slightly better steps this pm but remains limited to transfers   Stairs             Wheelchair Mobility    Modified Rankin (Stroke Patients Only)       Balance Overall balance assessment: Needs assistance Sitting-balance support: Feet supported, Bilateral upper extremity supported Sitting balance-Leahy Scale: Fair Sitting balance - Comments: supervision static sitting   Standing balance support: Bilateral upper extremity supported, During functional activity Standing balance-Leahy Scale: Poor                 High Level Balance Comments: extra time and max vc to keep toes pointing forward            Cognition Arousal/Alertness: Awake/alert Behavior  During Therapy: WFL for tasks assessed/performed Overall Cognitive Status: Within Functional Limits for tasks assessed                                           Exercises Other Exercises Other Exercises: seated AROM Other Exercises: assisted with bathing and linen change    General Comments General comments (skin integrity, edema, etc.): changed sacral pad d/t incontinence of BM in the bed; OT assisted with pericare in standing (sit<>stand at [facility])      Pertinent Vitals/Pain Pain Assessment Pain Assessment: Faces Faces Pain Scale: Hurts little more Pain Location: R hip with movement/weight bearing Pain Descriptors / Indicators: Discomfort, Sore Pain Intervention(s): Limited activity within patient's tolerance, Monitored during session, Repositioned    Home Living                          Prior Function            PT Goals (current goals can now be found in the care plan section) Progress towards PT goals: Progressing toward goals    Frequency    BID      PT Plan Current plan remains appropriate    Co-evaluation              AM-PAC PT "6 Clicks" Mobility   Outcome Measure  Help needed turning from your back to your side while in a flat bed without using bedrails?: A Little Help needed moving from lying on your back to sitting on the side of a flat bed without using bedrails?: A Lot Help needed moving to and from a bed to a chair (including a wheelchair)?: A Lot Help needed standing up from a chair using your arms (e.g., wheelchair or bedside chair)?: A Lot Help needed to walk in hospital room?: A Lot Help needed climbing 3-5 steps with a railing? : Total 6 Click Score: 12    End of Session Equipment Utilized During Treatment: Gait belt Activity Tolerance: Patient limited by fatigue;Patient tolerated treatment well Patient left: in bed;with call bell/phone within reach;with bed alarm set Nurse Communication: Mobility status;Precautions;Weight bearing status;Other (comment) PT Visit Diagnosis: Unsteadiness on feet (R26.81);Muscle weakness (generalized) (M62.81);Difficulty in walking, not elsewhere  classified (R26.2);Pain Pain - Right/Left: Right Pain - part of body: Hip     Time: 0253-0302 PT Time Calculation (min) (ACUTE ONLY): 9 min  Charges:  $Therapeutic Exercise: 8-22 mins $Therapeutic Activity: 8-22 mins                    Danielle Dess, PTA 08/23/21, 3:14 PM

## 2021-08-23 NOTE — Progress Notes (Signed)
°  Progress Note   Patient: [name] [name] [id number] DOB: 1946-02-11 DOA: 08/19/2021     4 DOS: the patient was seen and examined on 08/23/2021    Assessment and Plan: * Closed hip fracture requiring operative repair, right, sequela Status post right hip hemiarthroplasty on 08/20/2020.  Pain control. Physical therapy recommending rehab.  Awaiting insurance authorization for rehab.  Acute kidney injury superimposed on CKD ([place]) Today the patient has acute kidney injury on chronic kidney disease stage IIIa.  Creatinine went up to 1.59.  Previous creatinine 1.13.  Today's creatinine 1.30.  Fluid bolus today.  Acute postoperative anemia due to expected blood loss Hemoglobin dropped from 14.2 down to 9.2.  Hemoglobin stabilized at 9.5.    Autoimmune hepatitis ([place]) Continue Imuran and prednisone.   Hypertension Continue metoprolol.  Discontinue Norvasc.  GERD (gastroesophageal reflux disease) Continue PPI  Nausea Resolved  Thrombocytopenia (HCC) Platelet count 170 today.  This has resolved.  Acute urinary retention Required in and out catheterization last night.  We will give a fluid bolus and start Urecholine.  Hyponatremia Sodium 134 today.    Toenail fungus Will need podiatry as outpatient to cut these overgrown toenails.        Subjective: Patient feels okay has some pain in the hip.  Had a fall and had a hemiarthroplasty.  Physical Exam: Vitals:   08/23/21 0414 08/23/21 0747 08/23/21 1134 08/23/21 1525  BP: 127/87 134/86 96/60 117/72  Pulse: 91 88 73 70  Resp: 16 20 18 16   Temp: 97.8 F (36.6 C) 97.9 F (36.6 C) (!) 97.5 F (36.4 C) 98.2 F (36.8 C)  TempSrc:      SpO2: 92% 92% 95% 93%  Weight:      Height:       Physical Exam HENT:     Head: Normocephalic.     Mouth/Throat:     Pharynx: No oropharyngeal exudate.  Eyes:     General: Lids are normal.     Conjunctiva/sclera: Conjunctivae normal.  Cardiovascular:     Rate and Rhythm: Normal rate and  regular rhythm.     Heart sounds: Normal heart sounds, S1 normal and S2 normal.  Pulmonary:     Breath sounds: No decreased breath sounds, wheezing, rhonchi or rales.  Abdominal:     Palpations: Abdomen is soft.     Tenderness: There is no abdominal tenderness.  Musculoskeletal:     Right lower leg: No swelling.     Left lower leg: No swelling.  Skin:    General: Skin is warm.     Findings: No rash.  Neurological:     Mental Status: She is alert and oriented to person, place, and time.     Data Reviewed: Hemoglobin 9.5, platelet count 170  Family Communication:   Disposition: Status is: Inpatient Remains inpatient appropriate because: Awaiting insurance authorization for rehab  Planned Discharge Destination: Rehab  Author: Loletha Grayer, MD 08/23/2021 4:43 PM  For on call review [email or web address].

## 2021-08-23 NOTE — Progress Notes (Signed)
Physical Therapy Treatment Patient Details Name: [name] [name] MRN: [id number] DOB: [birth year] Today's Date: 08/23/2021   History of Present Illness Pt is a 76 y/o F admitted on 08/19/21 with c/c of fall & hitting her leg. Pt found to have : Displaced right femoral neck hip fracture, severe bilateral foot onychomycosis & pt underwent right hip hemiarthroplasty. Pt is WBAT with posterior hip precautions. PMH: osteoporosis, autoimmune hepatitis, HTN, GERD, chronic iron deficiency anemia, chronic ambulation dysfunction, HLD    PT Comments    Pt ready for session.  She is able to stand and transfer to/from commode in attempt to void.  No void but she does have a medium soft BM.  Care is provided.  She is able to take 2 steps forward and backwards with walker but continues to fatigue quickly limiting gait progression.  She also is hesitant to put increased weight on R LE which also impacts mobility.  Seated AROM.  She is assisted with bathing in chair and actively assists with her care.  Standing at bedside with RW for peribathing.  Pt is fatigued with effort but able to tolerate increased activity today.   Recommendations for follow up therapy are one component of a multi-disciplinary discharge planning process, led by the attending physician.  Recommendations may be updated based on patient status, additional functional criteria and insurance authorization.  Follow Up Recommendations  Skilled nursing-short term rehab (<3 hours/day)     Assistance Recommended at Discharge Frequent or constant Supervision/Assistance  Patient can return home with the following A lot of help with walking and/or transfers;A lot of help with bathing/dressing/bathroom;Assist for transportation;Assistance with cooking/housework;Direct supervision/assist for financial management;Direct supervision/assist for medications management;Help with stairs or ramp for entrance   Equipment Recommendations  None recommended by PT     Recommendations for Other Services       Precautions / Restrictions Precautions Precautions: Fall;Posterior Hip Precaution Booklet Issued: Yes (comment) Restrictions Weight Bearing Restrictions: Yes RLE Weight Bearing: Weight bearing as tolerated     Mobility  Bed Mobility               General bed mobility comments: in reclienr before and after    Transfers Overall transfer level: Needs assistance Equipment used: Rolling walker (2 wheels) Transfers: Bed to chair/wheelchair/BSC Sit to Stand: Min assist Stand pivot transfers: Min assist, Mod assist         General transfer comment: increased time and cues    Ambulation/Gait Ambulation/Gait assistance: Min assist Gait Distance (Feet): 2 Feet Assistive device: Rolling walker (2 wheels)   Gait velocity: decreased     General Gait Details: able to step forward a back x 2 and take small steps to/from [facility].  has more difficulty loving to L than R which is expeced with diagnosis   Stairs             Wheelchair Mobility    Modified Rankin (Stroke Patients Only)       Balance Overall balance assessment: Needs assistance Sitting-balance support: Feet supported, Bilateral upper extremity supported Sitting balance-Leahy Scale: Fair     Standing balance support: Bilateral upper extremity supported, During functional activity Standing balance-Leahy Scale: Fair                              Cognition Arousal/Alertness: Awake/alert Behavior During Therapy: WFL for tasks assessed/performed Overall Cognitive Status: Within Functional Limits for tasks assessed  Exercises Other Exercises Other Exercises: seated AROM Other Exercises: assisted with bathing and linen change    General Comments        Pertinent Vitals/Pain Pain Assessment Pain Assessment: Faces Faces Pain Scale: Hurts a little bit Pain Location: R hip with  movement/weight bearing Pain Descriptors / Indicators: Discomfort Pain Intervention(s): Limited activity within patient's tolerance, Monitored during session, Repositioned    Home Living                          Prior Function            PT Goals (current goals can now be found in the care plan section) Progress towards PT goals: Progressing toward goals    Frequency    BID      PT Plan Current plan remains appropriate    Co-evaluation              AM-PAC PT "6 Clicks" Mobility   Outcome Measure  Help needed turning from your back to your side while in a flat bed without using bedrails?: A Little Help needed moving from lying on your back to sitting on the side of a flat bed without using bedrails?: A Lot Help needed moving to and from a bed to a chair (including a wheelchair)?: A Lot Help needed standing up from a chair using your arms (e.g., wheelchair or bedside chair)?: A Lot Help needed to walk in hospital room?: A Lot Help needed climbing 3-5 steps with a railing? : Total 6 Click Score: 12    End of Session Equipment Utilized During Treatment: Gait belt Activity Tolerance: Patient limited by fatigue;Patient tolerated treatment well Patient left: in chair;with call bell/phone within reach;with chair alarm set Nurse Communication: Mobility status;Precautions;Weight bearing status;Other (comment) PT Visit Diagnosis: Unsteadiness on feet (R26.81);Muscle weakness (generalized) (M62.81);Difficulty in walking, not elsewhere classified (R26.2);Pain Pain - Right/Left: Right Pain - part of body: Hip     Time: 8676-1950 PT Time Calculation (min) (ACUTE ONLY): 25 min  Charges:  $Therapeutic Exercise: 8-22 mins $Therapeutic Activity: 8-22 mins                    Danielle Dess, PTA 08/23/21, 11:27 AM

## 2021-08-24 DIAGNOSIS — M2011 Hallux valgus (acquired), right foot: Secondary | ICD-10-CM | POA: Diagnosis not present

## 2021-08-24 DIAGNOSIS — N179 Acute kidney failure, unspecified: Secondary | ICD-10-CM | POA: Diagnosis not present

## 2021-08-24 DIAGNOSIS — B351 Tinea unguium: Secondary | ICD-10-CM | POA: Diagnosis not present

## 2021-08-24 DIAGNOSIS — D696 Thrombocytopenia, unspecified: Secondary | ICD-10-CM | POA: Diagnosis not present

## 2021-08-24 DIAGNOSIS — S72141A Displaced intertrochanteric fracture of right femur, initial encounter for closed fracture: Secondary | ICD-10-CM | POA: Diagnosis not present

## 2021-08-24 DIAGNOSIS — R110 Nausea: Secondary | ICD-10-CM | POA: Diagnosis not present

## 2021-08-24 DIAGNOSIS — M79675 Pain in left toe(s): Secondary | ICD-10-CM | POA: Diagnosis not present

## 2021-08-24 DIAGNOSIS — M79674 Pain in right toe(s): Secondary | ICD-10-CM | POA: Diagnosis not present

## 2021-08-24 DIAGNOSIS — R338 Other retention of urine: Secondary | ICD-10-CM | POA: Diagnosis not present

## 2021-08-24 DIAGNOSIS — K754 Autoimmune hepatitis: Secondary | ICD-10-CM | POA: Diagnosis not present

## 2021-08-24 DIAGNOSIS — R2689 Other abnormalities of gait and mobility: Secondary | ICD-10-CM | POA: Diagnosis not present

## 2021-08-24 DIAGNOSIS — R41841 Cognitive communication deficit: Secondary | ICD-10-CM | POA: Diagnosis not present

## 2021-08-24 DIAGNOSIS — M2012 Hallux valgus (acquired), left foot: Secondary | ICD-10-CM | POA: Diagnosis not present

## 2021-08-24 DIAGNOSIS — R5381 Other malaise: Secondary | ICD-10-CM | POA: Diagnosis not present

## 2021-08-24 DIAGNOSIS — N1831 Chronic kidney disease, stage 3a: Secondary | ICD-10-CM | POA: Diagnosis not present

## 2021-08-24 DIAGNOSIS — M6281 Muscle weakness (generalized): Secondary | ICD-10-CM | POA: Diagnosis not present

## 2021-08-24 DIAGNOSIS — D62 Acute posthemorrhagic anemia: Secondary | ICD-10-CM | POA: Diagnosis not present

## 2021-08-24 DIAGNOSIS — L600 Ingrowing nail: Secondary | ICD-10-CM | POA: Diagnosis not present

## 2021-08-24 DIAGNOSIS — I10 Essential (primary) hypertension: Secondary | ICD-10-CM | POA: Diagnosis not present

## 2021-08-24 DIAGNOSIS — Z4789 Encounter for other orthopedic aftercare: Secondary | ICD-10-CM | POA: Diagnosis not present

## 2021-08-24 DIAGNOSIS — K219 Gastro-esophageal reflux disease without esophagitis: Secondary | ICD-10-CM | POA: Diagnosis not present

## 2021-08-24 DIAGNOSIS — S7291XD Unspecified fracture of right femur, subsequent encounter for closed fracture with routine healing: Secondary | ICD-10-CM | POA: Diagnosis not present

## 2021-08-24 DIAGNOSIS — R69 Illness, unspecified: Secondary | ICD-10-CM | POA: Diagnosis not present

## 2021-08-24 DIAGNOSIS — M2041 Other hammer toe(s) (acquired), right foot: Secondary | ICD-10-CM | POA: Diagnosis not present

## 2021-08-24 DIAGNOSIS — S72001A Fracture of unspecified part of neck of right femur, initial encounter for closed fracture: Secondary | ICD-10-CM | POA: Diagnosis not present

## 2021-08-24 DIAGNOSIS — D509 Iron deficiency anemia, unspecified: Secondary | ICD-10-CM | POA: Diagnosis not present

## 2021-08-24 DIAGNOSIS — M2042 Other hammer toe(s) (acquired), left foot: Secondary | ICD-10-CM | POA: Diagnosis not present

## 2021-08-24 DIAGNOSIS — N189 Chronic kidney disease, unspecified: Secondary | ICD-10-CM | POA: Diagnosis not present

## 2021-08-24 LAB — SURGICAL PATHOLOGY

## 2021-08-24 MED ORDER — POLYETHYLENE GLYCOL 3350 17 G PO PACK: 17.0000 g | PACK | Freq: Every day | ORAL | 0 refills | Status: DC | PRN

## 2021-08-24 MED ORDER — ACETAMINOPHEN 325 MG PO TABS: 650.0000 mg | ORAL_TABLET | Freq: Four times a day (QID) | ORAL | Status: DC | PRN

## 2021-08-24 MED ORDER — ENOXAPARIN SODIUM 30 MG/0.3ML IJ SOSY: 30.0000 mg | PREFILLED_SYRINGE | INTRAMUSCULAR | 0 refills | Status: DC

## 2021-08-24 MED ORDER — HYDROCODONE-ACETAMINOPHEN 5-325 MG PO TABS: 1.0000 | ORAL_TABLET | Freq: Four times a day (QID) | ORAL | 0 refills | Status: DC | PRN

## 2021-08-24 MED ORDER — ENSURE ENLIVE PO LIQD: 237.0000 mL | Freq: Two times a day (BID) | ORAL | 0 refills | Status: DC

## 2021-08-24 MED ORDER — ADULT MULTIVITAMIN W/MINERALS CH: 1.0000 | ORAL_TABLET | Freq: Every day | ORAL | 0 refills | Status: DC

## 2021-08-24 NOTE — Progress Notes (Signed)
Contacted [facility] and reported to facility nurse, Yvonna Alanis, Charity fundraiser.

## 2021-08-24 NOTE — TOC Progression Note (Signed)
Transition of Care [facility]) - Progression Note    Patient Details  Name: [name] [name] MRN: [id number] Date of Birth: August 02, 1945  Transition of Care [facility]) CM/SW [place], RN Phone Number: 08/24/2021, 10:42 AM  Clinical Narrative:    Called the patient's Husband and let him know that the patient is going to [facility] today to room 213, Called EMS to pick up the patient there is 1 ahead of her   Expected Discharge Plan: [place] Barriers to Discharge: Continued Medical Work up  Expected Discharge Plan and Services Expected Discharge Plan: [place] In-house Referral: Clinical Social Work   Post Acute Care Choice: [place] Living arrangements for the past 2 months: Mobile Home Expected Discharge Date: 08/24/21                                     Social Determinants of Health (SDOH) Interventions    Readmission Risk Interventions No flowsheet data found.

## 2021-08-24 NOTE — Progress Notes (Signed)
Occupational Therapy Treatment Patient Details Name: [name] [name] MRN: [id number] DOB: Mar 28, 1946 Today's Date: 08/24/2021   History of present illness Pt is a 76 y/o F admitted on 08/19/21 with c/c of fall & hitting her leg. Pt found to have : Displaced right femoral neck hip fracture, severe bilateral foot onychomycosis & pt underwent right hip hemiarthroplasty. Pt is WBAT with posterior hip precautions. PMH: osteoporosis, autoimmune hepatitis, HTN, GERD, chronic iron deficiency anemia, chronic ambulation dysfunction, HLD   OT comments  Pt awake/alert/agreeable to OT visit this a.m.  Assisted pt supine to sit with min A to transition trunk and lift R leg to side of bed.  Pt transferred to [facility] with min A and vc for hand placement, but unable to have BM.  Pt took several shuffling steps toward recliner, requiring max vc and min assist to weight shift to pick up feet to avoid shuffling.  Extra time needed for bedside fxl mobility.  OT assisted pt to recliner in prep for breakfast tray.  Reviewed hip precautions with pt; able to recall 2 of 3 precautions this day, and cueing required during bed mobility and transition from [facility] to recliner to keep toes forward.  Will continue to work towards OT goals.    Recommendations for follow up therapy are one component of a multi-disciplinary discharge planning process, led by the attending physician.  Recommendations may be updated based on patient status, additional functional criteria and insurance authorization.    Follow Up Recommendations  Skilled nursing-short term rehab (<3 hours/day)    Assistance Recommended at Discharge Frequent or constant Supervision/Assistance  Patient can return home with the following  A lot of help with walking and/or transfers;A lot of help with bathing/dressing/bathroom;Assistance with cooking/housework;Assist for transportation;Help with stairs or ramp for entrance   Equipment Recommendations  Other (comment) (defer to  next venue of care)          Precautions / Restrictions Precautions Precautions: Fall;Posterior Hip Precaution Booklet Issued: Yes (comment) Precaution Comments: reviewed with pt during session; pt able to recall 2 of 3 and requiring constant cueing to keep toes forward when turning in standing Restrictions Weight Bearing Restrictions: Yes RLE Weight Bearing: Weight bearing as tolerated       Mobility Bed Mobility Overal bed mobility: Needs Assistance Bed Mobility: Supine to Sit     Supine to sit: Min assist     General bed mobility comments: min A to lift R leg over to side of bed, min A with trunk transition from supine to sit Patient Response: Cooperative  Transfers Overall transfer level: Needs assistance Equipment used: Rolling walker (2 wheels) Transfers: Sit to/from Stand Sit to Stand: Min assist     Step pivot transfers: Min assist     General transfer comment: increased time and cues     Balance Overall balance assessment: Needs assistance Sitting-balance support: Feet supported, Bilateral upper extremity supported Sitting balance-Leahy Scale: Fair Sitting balance - Comments: supervision static sitting   Standing balance support: Bilateral upper extremity supported, During functional activity Standing balance-Leahy Scale: Poor                 High Level Balance Comments: extra time and max vc to keep toes pointing forward           ADL either performed or assessed with clinical judgement   ADL Overall ADL's : Needs assistance/impaired     Grooming: Set up;Sitting;Oral care;Brushing hair  Toilet Transfer: Minimal assistance;Stand-pivot;BSC/3in1;Rolling walker (2 wheels) Toilet Transfer Details (indicate cue type and reason): extra time needed.  Pt scooted feet 50% of the way vs picking up feet to take a step   Toileting - Clothing Manipulation Details (indicate cue type and reason): catheter in place; pt attempted  BM but unable.  RN later stated that pt had had incontinence of BM in the bed this morning and had been cleaned up.       General ADL Comments: shuffled feet from bed to [facility] to recliner with a few steps with max cues, min A and RW for stability, extra time.    Extremity/Trunk Assessment Upper Extremity Assessment Upper Extremity Assessment: Generalized weakness   Lower Extremity Assessment Lower Extremity Assessment: Defer to PT evaluation        Vision Patient Visual Report: No change from baseline                Cognition Arousal/Alertness: Awake/alert Behavior During Therapy: WFL for tasks assessed/performed Overall Cognitive Status: Within Functional Limits for tasks assessed                                 General Comments: Pt is A and oriented x 3. able to recall 2 hip precautions        Exercises                   Pertinent Vitals/ Pain       Pain Assessment Pain Score: 7  Pain Location: R hip with movement/weight bearing Pain Descriptors / Indicators: Discomfort, Sore Pain Intervention(s): Limited activity within patient's tolerance, Monitored during session, Repositioned                                                          Frequency  Min 2X/week        Progress Toward Goals  OT Goals(current goals can now be found in the care plan section)  Progress towards OT goals: Progressing toward goals  Acute Rehab OT Goals Patient Stated Goal: to get stronger OT Goal Formulation: With patient Time For Goal Achievement: 09/04/21 Potential to Achieve Goals: Good  Plan Discharge plan remains appropriate                     AM-PAC OT "6 Clicks" Daily Activity     Outcome Measure   Help from another person eating meals?: None Help from another person taking care of personal grooming?: A Little Help from another person toileting, which includes using toliet, bedpan, or urinal?: A Lot Help from  another person bathing (including washing, rinsing, drying)?: A Lot Help from another person to put on and taking off regular upper body clothing?: A Little Help from another person to put on and taking off regular lower body clothing?: A Lot 6 Click Score: 16    End of Session Equipment Utilized During Treatment: Gait belt;Rolling walker (2 wheels)  OT Visit Diagnosis: Unsteadiness on feet (R26.81);Muscle weakness (generalized) (M62.81);Pain Pain - Right/Left: Right Pain - part of body: Hip   Activity Tolerance Patient limited by pain   Patient Left in chair;with call bell/phone within reach;with chair alarm set   Nurse Communication Mobility status  [id number] OT Time Calculation (min): 27 min  Charges: OT General Charges $OT Visit: 1 Visit OT Treatments $Self Care/Home Management : 23-37 mins  [name] Speller, MS, OTR/L   Darleene Cleaver 08/24/2021, 3:07 PM

## 2021-08-24 NOTE — TOC Progression Note (Signed)
Transition of Care [facility]) - Progression Note    Patient Details  Name: [name] [name] MRN: [id number] Date of Birth: 10/02/1945  Transition of Care [facility]) CM/SW [place], RN Phone Number: 08/24/2021, 3:19 PM  Clinical Narrative:    I called the son Mitzi Hansen at his request at [phone], He stated that his dad is blind and Hard of hearing and does not drive He stated that he is a paramedic in the county and has been for 30 years I explained that we do a bed search and that when the patient is alert and oriented we let them make the choice She was offered 3 bed choices He stated that she wanted to go to [facility], I explained [facility] not an option, there was no bed offers from [facility] He stated understanding and that he will work with what they have     Expected Discharge Plan: [place] Barriers to Discharge: Continued Medical Work up  Expected Discharge Plan and Services Expected Discharge Plan: [place] In-house Referral: Clinical Social Work   Post Acute Care Choice: [place] Living arrangements for the past 2 months: Mobile Home Expected Discharge Date: 08/24/21                                     Social Determinants of Health (SDOH) Interventions    Readmission Risk Interventions No flowsheet data found.

## 2021-08-24 NOTE — Discharge Summary (Signed)
Physician Discharge Summary   Patient: [name] [name] MRN: [id number] DOB: 1945-08-28  Admit date:     08/19/2021  Discharge date: 08/24/21  Discharge Physician: Alford Highland   PCP: Margaretann Loveless, MD   Recommendations at discharge:   Follow-up team at rehab 1 day Follow-up orthopedic surgery 2 weeks Follow-up urology 1 week for voiding trial for Dr. Wyn Quaker at facility Follow-up podiatry to cut overgrown fungal toenails 1 month    Discharge Diagnoses: Principal Problem:   Closed hip fracture requiring operative repair, right, sequela Active Problems:   Acute kidney injury superimposed on CKD (HCC)   Acute urinary retention   Acute postoperative anemia due to expected blood loss   Autoimmune hepatitis (HCC)   Hypertension   GERD (gastroesophageal reflux disease)   Nausea   Toenail fungus   Hyponatremia   Thrombocytopenia [facility])   Hospital Course: The patient was admitted to the hospital for a fall and found to have a right hip fracture.  The patient was brought to the operating room by Dr. Martha Clan on 08/20/2021 and had a right hip hemiarthroplasty.  The patient will go out to rehab on 08/24/2021.  Physical therapy as tolerated.  Try to switch over to Tylenol as quickly as possible.  DVT prophylaxis with Lovenox.  Can resume Prolia injections as outpatient after rehab completed.  Acute urinary retention.  The patient had in and out catheterizations for couple days.  On the morning of discharge at the rehab was retaining 730 mL of urine after trying to urinate.  Will place Foley catheter and can do a voiding trial in 1 week.  Foley catheter care.  Check CBC and BMP in 1 week  Assessment and Plan: * Closed hip fracture requiring operative repair, right, sequela Status post right hip hemiarthroplasty on 08/20/2020.  Pain control. Physical therapy recommending rehab.  Out to rehab today.  Acute urinary retention Patient required in and out catheterizations for the last 2 days.   After trying to urinate today bladder had 730 mL.  Will place Foley catheter.  Can follow-up with urology for voiding trial or can try and the facility in 1 week.  Acute kidney injury superimposed on CKD (HCC) Today the patient has acute kidney injury on chronic kidney disease stage IIIa.  Creatinine went up to 1.59.  Previous creatinine 1.13.  Yesterday's creatinine 1.30.  Recommend checking a BMP in 1 week.  Foley catheter will be placed today.  Acute postoperative anemia due to expected blood loss Hemoglobin dropped from 14.2 down to 9.2.  Hemoglobin stabilized at 9.5.    Autoimmune hepatitis (HCC) Continue Imuran and prednisone.   Hypertension Continue metoprolol.  Discontinue Norvasc.  GERD (gastroesophageal reflux disease) Continue PPI  Nausea Resolved  Thrombocytopenia (HCC) Platelet count 170 today.  This has resolved.  Hyponatremia Last sodium 134.  Went down to 131 at its lowest point.  Toenail fungus Will need podiatry as outpatient to cut these overgrown toenails.           Consultants: Orthopedic surgery Procedures performed: Right hemiarthroplasty Disposition: Rehabilitation facility Diet recommendation:  Regular diet  DISCHARGE MEDICATION: Allergies as of 08/24/2021       Reactions   Sulfa Antibiotics Nausea And Vomiting   Pt has not had a sulfa drug in years and is not certain of reaction but suspects nausea and vomiting.   Sulfasalazine Nausea And Vomiting        Medication List     STOP taking these medications  alendronate 70 MG tablet Commonly known as: FOSAMAX   amLODipine 5 MG tablet Commonly known as: NORVASC   benazepril 40 MG tablet Commonly known as: LOTENSIN   Cholecalciferol 1.25 MG (50000 UT) capsule   Prolia 60 MG/ML Sosy injection Generic drug: denosumab       TAKE these medications    acetaminophen 325 MG tablet Commonly known as: TYLENOL Take 2 tablets (650 mg total) by mouth every 6 (six) hours as needed  for mild pain or moderate pain (pain score 1-3 or temp > 100.5).   aspirin EC 81 MG tablet Take 81 mg by mouth daily.   azaTHIOprine 50 MG tablet Commonly known as: IMURAN Take 25 mg by mouth daily.   Calcium Carbonate Antacid 600 MG chewable tablet Chew 600 mg by mouth.   calcium citrate-vitamin D 315-200 MG-UNIT tablet Commonly known as: CITRACAL+D Take 1 tablet by mouth 2 (two) times daily.   cyanocobalamin 100 MCG tablet Take 100 mcg by mouth daily.   enoxaparin 30 MG/0.3ML injection Commonly known as: LOVENOX Inject 0.3 mLs (30 mg total) into the skin daily for 14 days.   estradiol 0.1 MG/GM vaginal cream Commonly known as: ESTRACE Insert pea size amount daily x 2 weeks then every other day x 2 weeks then 2-3 times weekly for maintenance   feeding supplement Liqd Take 237 mLs by mouth 2 (two) times daily between meals.   Ferrous Fumarate 324 (106 Fe) MG Tabs tablet Commonly known as: HEMOCYTE - 106 mg FE Take by mouth. What changed: Another medication with the same name was removed. Continue taking this medication, and follow the directions you see here.   HYDROcodone-acetaminophen 5-325 MG tablet Commonly known as: NORCO/VICODIN Take 1 tablet by mouth every 6 (six) hours as needed for severe pain.   metoprolol tartrate 50 MG tablet Commonly known as: LOPRESSOR Take 50 mg by mouth daily.   multivitamin with minerals Tabs tablet Take 1 tablet by mouth daily.   omeprazole 40 MG capsule Commonly known as: PRILOSEC Take 40 mg by mouth daily.   polyethylene glycol 17 g packet Commonly known as: MIRALAX / GLYCOLAX Take 17 g by mouth daily as needed for mild constipation.   potassium chloride 10 MEQ tablet Commonly known as: KLOR-CON Take 10 mEq by mouth daily.   predniSONE 5 MG tablet Commonly known as: DELTASONE Take 7.5 mg by mouth daily.   Walker Misc 1 Device by Does not apply route as directed.        Contact information for follow-up providers      Samara Deist, DPM Follow up in 4 week(s).   Specialty: Podiatry Why: Toenail clipping needed Contact information: [place] [address] [phone]         Abbie Sons, MD Follow up in 1 week(s).   Specialty: Urology Why: urinary retension Contact information: [place] [address] [phone]         Thornton Park, MD Follow up in 2 week(s).   Specialty: Orthopedic Surgery Contact information: [place] [address] [phone]              Contact information for after-discharge care     Destination     [facility] .   Service: Skilled Chiropodist information: [address] [place] [phone]  Discharge Exam: Filed Weights   08/19/21 1338  Weight: 57.6 kg   Physical Exam HENT:     Head: Normocephalic.     Mouth/Throat:     Pharynx: No oropharyngeal exudate.  Eyes:     General: Lids are normal.     Conjunctiva/sclera: Conjunctivae normal.  Cardiovascular:     Rate and Rhythm: Normal rate and regular rhythm.     Heart sounds: Normal heart sounds, S1 normal and S2 normal.  Pulmonary:     Breath sounds: No decreased breath sounds, wheezing, rhonchi or rales.  Abdominal:     Palpations: Abdomen is soft.     Tenderness: There is no abdominal tenderness.  Musculoskeletal:     Right lower leg: No swelling.     Left lower leg: No swelling.  Skin:    General: Skin is warm.     Findings: No rash.  Neurological:     Mental Status: She is alert and oriented to person, place, and time.     Condition at discharge: stable  The results of significant diagnostics from this hospitalization (including imaging, microbiology, ancillary and laboratory) are listed below for reference.   Imaging Studies: DG Chest 1 View  Result Date: 08/19/2021 CLINICAL DATA:  Fall. EXAM:  CHEST  1 VIEW COMPARISON:  October 05, 2017. FINDINGS: Stable cardiomediastinal silhouette. Large hiatal hernia is noted. Both lungs are clear. The visualized skeletal structures are unremarkable. IMPRESSION: No active disease. Electronically Signed   By: Marijo Conception M.D.   On: 08/19/2021 14:27   CT ABDOMEN PELVIS W CONTRAST  Result Date: 08/17/2021 CLINICAL DATA:  Unexplained weight loss. EXAM: CT ABDOMEN AND PELVIS WITH CONTRAST TECHNIQUE: Multidetector CT imaging of the abdomen and pelvis was performed using the standard protocol following bolus administration of intravenous contrast. RADIATION DOSE REDUCTION: This exam was performed according to the departmental dose-optimization program which includes automated exposure control, adjustment of the mA and/or kV according to patient size and/or use of iterative reconstruction technique. CONTRAST:  21mL OMNIPAQUE IOHEXOL 300 MG/ML  SOLN COMPARISON:  MRI abdomen 12/02/2020 FINDINGS: Lower chest: The lung bases are clear of acute process. No pleural effusion or pulmonary lesions. The heart is normal in size. No pericardial effusion. Stable large hiatal hernia with most of the stomach up in the chest. Hepatobiliary: Stable numerous hepatic cysts. No worrisome hepatic lesions or intrahepatic biliary dilatation. The portal and hepatic veins are patent. Gallbladder is unremarkable. No common bile duct dilatation. Pancreas: Stable 2.2 x 1.1 cm cyst in the pancreatic head. Smaller cyst in the pancreatic tail is stable 7 mm. No pancreatic ductal dilatation or acute inflammation. Spleen: Normal size. No focal lesions. Adrenals/Urinary Tract: Adrenal glands are unremarkable. Stable renal cortical thinning but no worrisome renal lesions or hydronephrosis. Stable small bilateral renal cysts. The bladder is unremarkable. Stomach/Bowel: The stomach, duodenum, small bowel and colon are unremarkable. No acute inflammatory process, mass lesion or obstructive findings. Stable  scattered descending and sigmoid colon diverticulosis. Vascular/Lymphatic: Tortuosity and calcification of the abdominal aorta but no aneurysm or dissection. The branch vessels are patent. Moderate ostial calcifications involving the left renal artery. The major venous structures are patent. No mesenteric or retroperitoneal mass or adenopathy. Reproductive: The uterus is surgically absent. Both ovaries are still present and are normal. Other: No pelvic mass or adenopathy. No free pelvic fluid collections. No inguinal mass or adenopathy. No abdominal wall hernia or subcutaneous lesions. Musculoskeletal: Marked thoracolumbar scoliosis and associated degenerative spondylosis. No acute bony findings or  worrisome bone lesions. Right greater than left hip joint degenerative changes. IMPRESSION: 1. No acute abdominal/pelvic findings, mass lesion or lymphadenopathy. 2. Stable large hiatal hernia with most of the stomach up in the chest. 3. Stable hepatic and renal cysts. 4. Stable pancreatic cysts. Aortic Atherosclerosis (ICD10-I70.0). Electronically Signed   By: Marijo Sanes M.D.   On: 08/17/2021 09:09   DG Hip Port Unilat With Pelvis 1V Right  Result Date: 08/20/2021 CLINICAL DATA:  Right hip replacement EXAM: DG HIP (WITH OR WITHOUT PELVIS) 1V PORT RIGHT COMPARISON:  None. FINDINGS: Generalized osteopenia. Interval right hip arthroplasty without hardware failure or complication. Normal alignment. Postsurgical changes in the surrounding soft tissues. Nondisplaced fracture of the right inferior pubic ramus. Otherwise, no acute fracture or dislocation. IMPRESSION: 1. Interval right hip arthroplasty. Electronically Signed   By: Kathreen Devoid M.D.   On: 08/20/2021 13:02   DG Hip Unilat  With Pelvis 2-3 Views Right  Result Date: 08/19/2021 CLINICAL DATA:  Fall, right hip pain EXAM: DG HIP (WITH OR WITHOUT PELVIS) 3V RIGHT COMPARISON:  Radiograph dated October 05, 2017. FINDINGS: Displaced right femoral neck fracture.  Unchanged calcification located superior to the right greater trochanter. Degenerative changes of the right hip joint. Contrast material noted in the bowel. Soft tissues otherwise unremarkable. IMPRESSION: Displaced right femoral neck fracture. Electronically Signed   By: Yetta Glassman M.D.   On: 08/19/2021 14:28    Microbiology: Results for orders placed or performed during the hospital encounter of 08/19/21  Resp Panel by RT-PCR (Flu A&B, Covid) Nasopharyngeal Swab     Status: None   Collection Time: 08/19/21  3:38 PM   Specimen: Nasopharyngeal Swab; Nasopharyngeal(NP) swabs in vial transport medium  Result Value Ref Range Status   SARS Coronavirus 2 by RT PCR NEGATIVE NEGATIVE Final    Comment: (NOTE) SARS-CoV-2 target nucleic acids are NOT DETECTED.  The SARS-CoV-2 RNA is generally detectable in upper respiratory specimens during the acute phase of infection. The lowest concentration of SARS-CoV-2 viral copies this assay can detect is 138 copies/mL. A negative result does not preclude SARS-Cov-2 infection and should not be used as the sole basis for treatment or other patient management decisions. A negative result may occur with  improper specimen collection/handling, submission of specimen other than nasopharyngeal swab, presence of viral mutation(s) within the areas targeted by this assay, and inadequate number of viral copies(<138 copies/mL). A negative result must be combined with clinical observations, patient history, and epidemiological information. The expected result is Negative.  Fact Sheet for Patients:  EntrepreneurPulse.com.au  Fact Sheet for Healthcare Providers:  IncredibleEmployment.be  This test is no t yet approved or cleared by the [place] FDA and  has been authorized for detection and/or diagnosis of SARS-CoV-2 by FDA under an Emergency Use Authorization (EUA). This EUA will remain  in effect (meaning this test can  be used) for the duration of the COVID-19 declaration under Section 564(b)(1) of the Act, 21 U.S.C.section 360bbb-3(b)(1), unless the authorization is terminated  or revoked sooner.       Influenza A by PCR NEGATIVE NEGATIVE Final   Influenza B by PCR NEGATIVE NEGATIVE Final    Comment: (NOTE) The Xpert Xpress SARS-CoV-2/FLU/RSV plus assay is intended as an aid in the diagnosis of influenza from Nasopharyngeal swab specimens and should not be used as a sole basis for treatment. Nasal washings and aspirates are unacceptable for Xpert Xpress SARS-CoV-2/FLU/RSV testing.  Fact Sheet for Patients: EntrepreneurPulse.com.au  Fact Sheet for Healthcare Providers: IncredibleEmployment.be  This test is not yet approved or cleared by the [place] and has been authorized for detection and/or diagnosis of SARS-CoV-2 by FDA under an Emergency Use Authorization (EUA). This EUA will remain in effect (meaning this test can be used) for the duration of the COVID-19 declaration under Section 564(b)(1) of the Act, 21 U.S.C. section 360bbb-3(b)(1), unless the authorization is terminated or revoked.  Performed at [facility] [address], [place], [place] [address]   Surgical pcr screen     Status: None   Collection Time: 08/20/21  8:14 AM   Specimen: Nasal Mucosa; Nasal Swab  Result Value Ref Range Status   MRSA, PCR NEGATIVE NEGATIVE Final   Staphylococcus aureus NEGATIVE NEGATIVE Final    Comment: (NOTE) The Xpert SA Assay (FDA approved for NASAL specimens in patients 49 years of age and older), is one component of a comprehensive surveillance program. It is not intended to diagnose infection nor to guide or monitor treatment. Performed at [facility], [address] [place],  [address]   Resp Panel by RT-PCR (Flu A&B, Covid) Nasopharyngeal Swab     Status: None   Collection Time: 08/23/21  4:06 PM   Specimen:  Nasopharyngeal Swab; Nasopharyngeal(NP) swabs in vial transport medium  Result Value Ref Range Status   SARS Coronavirus 2 by RT PCR NEGATIVE NEGATIVE Final    Comment: (NOTE) SARS-CoV-2 target nucleic acids are NOT DETECTED.  The SARS-CoV-2 RNA is generally detectable in upper respiratory specimens during the acute phase of infection. The lowest concentration of SARS-CoV-2 viral copies this assay can detect is 138 copies/mL. A negative result does not preclude SARS-Cov-2 infection and should not be used as the sole basis for treatment or other patient management decisions. A negative result may occur with  improper specimen collection/handling, submission of specimen other than nasopharyngeal swab, presence of viral mutation(s) within the areas targeted by this assay, and inadequate number of viral copies(<138 copies/mL). A negative result must be combined with clinical observations, patient history, and epidemiological information. The expected result is Negative.  Fact Sheet for Patients:  EntrepreneurPulse.com.au  Fact Sheet for Healthcare Providers:  IncredibleEmployment.be  This test is no t yet approved or cleared by the [place] FDA and  has been authorized for detection and/or diagnosis of SARS-CoV-2 by FDA under an Emergency Use Authorization (EUA). This EUA will remain  in effect (meaning this test can be used) for the duration of the COVID-19 declaration under Section 564(b)(1) of the Act, 21 U.S.C.section 360bbb-3(b)(1), unless the authorization is terminated  or revoked sooner.       Influenza A by PCR NEGATIVE NEGATIVE Final   Influenza B by PCR NEGATIVE NEGATIVE Final    Comment: (NOTE) The Xpert Xpress SARS-CoV-2/FLU/RSV plus assay is intended as an aid in the diagnosis of influenza from Nasopharyngeal swab specimens and should not be used as a sole basis for treatment. Nasal washings and aspirates are unacceptable for  Xpert Xpress SARS-CoV-2/FLU/RSV testing.  Fact Sheet for Patients: EntrepreneurPulse.com.au  Fact Sheet for Healthcare Providers: IncredibleEmployment.be  This test is not yet approved or cleared by the [place] FDA and has been authorized for detection and/or diagnosis of SARS-CoV-2 by FDA under an Emergency Use Authorization (EUA). This EUA will remain in effect (meaning this test can be used) for the duration of the COVID-19 declaration under Section 564(b)(1) of the Act, 21 U.S.C. section 360bbb-3(b)(1), unless the authorization is terminated or revoked.  Performed at [facility],  [place], [place] [address]     Labs: CBC: Recent Labs  Lab 08/19/21 1339 08/20/21 0401 08/21/21 0507 08/22/21 0506 08/23/21 0612  WBC 11.3* 8.7 11.9* 8.4 8.9  NEUTROABS 9.0*  --   --   --   --   HGB 14.2 12.9 10.2* 9.2* 9.5*  HCT 46.2* 41.3 32.0* 28.4* 30.6*  MCV 89.7 86.2 86.5 85.5 89.2  PLT 167 170 171 140* [id number]   Basic Metabolic Panel: Recent Labs  Lab 08/19/21 1840 08/19/21 1848 08/21/21 0507 08/22/21 0506 08/23/21 0612  NA  --  135 131* 132* 134*  K  --  3.6 3.7 3.4* 4.2  CL  --  103 100 103 103  CO2  --  23 23 23 23   GLUCOSE  --  141* 105* 91 87  BUN  --  18 22 25* 29*  CREATININE  --  1.13* 1.59* 1.49* 1.30*  CALCIUM  --  9.2 8.3* 8.7* 9.5  MG 1.8  --   --   --   --    Liver Function Tests: Recent Labs  Lab 08/19/21 1848  AST 26  ALT 15  ALKPHOS 43  BILITOT 0.7  PROT 7.2  ALBUMIN 3.6    Discharge time spent: greater than 30 minutes.  I left a message for the patient's husband but spoke with him yesterday afternoon about the plan  Signed: Loletha Grayer, MD [facility] 08/24/2021

## 2021-08-24 NOTE — TOC Progression Note (Signed)
Transition of Care [facility]) - Progression Note    Patient Details  Name: ANAE HAMS MRN: [id number] Date of Birth: 09-Jan-1946  Transition of Care [facility]) CM/SW Contact  Marlowe Sax, RN Phone Number: 08/24/2021, 10:29 AM  Clinical Narrative:   Going to room 213, at [facility] Will need EMS to transport Ins approved [id number]    Expected Discharge Plan: Skilled Nursing Facility Barriers to Discharge: Continued Medical Work up  Expected Discharge Plan and Services Expected Discharge Plan: Skilled Nursing Facility In-house Referral: Clinical Social Work   Post Acute Care Choice: Skilled Nursing Facility Living arrangements for the past 2 months: Mobile Home Expected Discharge Date: 08/24/21                                     Social Determinants of Health (SDOH) Interventions    Readmission Risk Interventions No flowsheet data found.

## 2021-08-27 ENCOUNTER — Encounter: Payer: Self-pay | Admitting: Oncology

## 2021-08-27 DIAGNOSIS — N1831 Chronic kidney disease, stage 3a: Secondary | ICD-10-CM | POA: Diagnosis not present

## 2021-08-27 DIAGNOSIS — S72001A Fracture of unspecified part of neck of right femur, initial encounter for closed fracture: Secondary | ICD-10-CM | POA: Diagnosis not present

## 2021-08-27 DIAGNOSIS — I10 Essential (primary) hypertension: Secondary | ICD-10-CM | POA: Diagnosis not present

## 2021-08-27 DIAGNOSIS — D509 Iron deficiency anemia, unspecified: Secondary | ICD-10-CM | POA: Diagnosis not present

## 2021-09-02 ENCOUNTER — Ambulatory Visit: Payer: Medicare Other | Admitting: Urology

## 2021-09-03 ENCOUNTER — Ambulatory Visit: Admit: 2021-09-03 | Payer: Medicare PPO

## 2021-09-03 SURGERY — COLONOSCOPY WITH PROPOFOL
Anesthesia: General

## 2021-09-06 DIAGNOSIS — M2012 Hallux valgus (acquired), left foot: Secondary | ICD-10-CM | POA: Diagnosis not present

## 2021-09-06 DIAGNOSIS — M2042 Other hammer toe(s) (acquired), left foot: Secondary | ICD-10-CM | POA: Diagnosis not present

## 2021-09-06 DIAGNOSIS — M2011 Hallux valgus (acquired), right foot: Secondary | ICD-10-CM | POA: Diagnosis not present

## 2021-09-06 DIAGNOSIS — L600 Ingrowing nail: Secondary | ICD-10-CM | POA: Diagnosis not present

## 2021-09-06 DIAGNOSIS — M79674 Pain in right toe(s): Secondary | ICD-10-CM | POA: Diagnosis not present

## 2021-09-06 DIAGNOSIS — M2041 Other hammer toe(s) (acquired), right foot: Secondary | ICD-10-CM | POA: Diagnosis not present

## 2021-09-06 DIAGNOSIS — M79675 Pain in left toe(s): Secondary | ICD-10-CM | POA: Diagnosis not present

## 2021-09-06 DIAGNOSIS — B351 Tinea unguium: Secondary | ICD-10-CM | POA: Diagnosis not present

## 2021-09-08 DIAGNOSIS — S72141A Displaced intertrochanteric fracture of right femur, initial encounter for closed fracture: Secondary | ICD-10-CM | POA: Diagnosis not present

## 2021-09-13 NOTE — Progress Notes (Signed)
09/14/2021 8:49 PM   [name] [name] 13-Nov-1945 [id number]  Referring provider: Margaretann Loveless, MD [address] [place],  [place] [address]  Chief Complaint  Patient presents with   Urinary Retention   Urological history: 1. Renal cysts -contrast CT 07/2021 - stable small bilateral renal cysts  HPI: [name] [name] is a 76 y.o. female who experienced post-operative urinary retention after undergoing a right hip hemiarthroplasty on 08/20/2021.  Her catheter was removed this morning without issue.  She states that she drank a pitcher of water today, but she has not voided.    We changed her depends and they were completely dry.  Her caregiver stated that her depends have not been changed since her office visit [place] this morning.   She denies any suprapubic discomfort or an urge to void at this time.  PVR 16 mL  PMH: Past Medical History:  Diagnosis Date   Abnormal LFTs (liver function tests)    Autoimmune hepatitis (HCC) 2018   Autoimmune hepatitis (HCC)    Autoimmune hepatitis treated with steroids (HCC)    DOE (dyspnea on exertion)    GERD (gastroesophageal reflux disease)    Hepatitis    Hyperlipidemia    Hypertension    Iron deficiency anemia due to chronic blood loss 05/27/2021   Osteoporosis     Surgical History: Past Surgical History:  Procedure Laterality Date   ABDOMINAL HYSTERECTOMY     COLONOSCOPY  10/06/2015   COLONOSCOPY WITH PROPOFOL N/A 08/27/2018   Procedure: COLONOSCOPY WITH PROPOFOL;  Surgeon: Scot Jun, MD;  Location: [facility] ENDOSCOPY;  Service: Endoscopy;  Laterality: N/A;   ESOPHAGOGASTRODUODENOSCOPY (EGD) WITH PROPOFOL N/A 08/27/2018   Procedure: ESOPHAGOGASTRODUODENOSCOPY (EGD) WITH PROPOFOL;  Surgeon: Scot Jun, MD;  Location: [facility] ENDOSCOPY;  Service: Endoscopy;  Laterality: N/A;   ESOPHAGOGASTRODUODENOSCOPY (EGD) WITH PROPOFOL N/A 05/21/2021   Procedure: ESOPHAGOGASTRODUODENOSCOPY (EGD) WITH PROPOFOL;  Surgeon: Regis Bill, MD;  Location: [facility] ENDOSCOPY;  Service: Endoscopy;  Laterality: N/A;   HIP ARTHROPLASTY Right 08/20/2021   Procedure: ARTHROPLASTY BIPOLAR HIP (HEMIARTHROPLASTY);  Surgeon: Juanell Fairly, MD;  Location: [facility] ORS;  Service: Orthopedics;  Laterality: Right;   LIVER BIOPSY      Home Medications:  Allergies as of 09/14/2021       Reactions   Sulfa Antibiotics Nausea And Vomiting   Pt has not had a sulfa drug in years and is not certain of reaction but suspects nausea and vomiting.   Sulfasalazine Nausea And Vomiting        Medication List        Accurate as of September 14, 2021  8:49 PM. If you have any questions, ask your nurse or doctor.          acetaminophen 325 MG tablet Commonly known as: TYLENOL Take 2 tablets (650 mg total) by mouth every 6 (six) hours as needed for mild pain or moderate pain (pain score 1-3 or temp > 100.5).   aspirin EC 81 MG tablet Take 81 mg by mouth daily.   azaTHIOprine 50 MG tablet Commonly known as: IMURAN Take 25 mg by mouth daily.   Calcium Carbonate Antacid 600 MG chewable tablet Chew 600 mg by mouth.   calcium citrate-vitamin D 315-200 MG-UNIT tablet Commonly known as: CITRACAL+D Take 1 tablet by mouth 2 (two) times daily.   cyanocobalamin 100 MCG tablet Take 100 mcg by mouth daily.   enoxaparin 30 MG/0.3ML injection Commonly known as: LOVENOX Inject 0.3 mLs (30 mg total)  into the skin daily for 14 days.   estradiol 0.1 MG/GM vaginal cream Commonly known as: ESTRACE Insert pea size amount daily x 2 weeks then every other day x 2 weeks then 2-3 times weekly for maintenance   feeding supplement Liqd Take 237 mLs by mouth 2 (two) times daily between meals.   Ferrous Fumarate 324 (106 Fe) MG Tabs tablet Commonly known as: HEMOCYTE - 106 mg FE Take by mouth.   HYDROcodone-acetaminophen 5-325 MG tablet Commonly known as: NORCO/VICODIN Take 1 tablet by mouth every 6 (six) hours as needed for severe pain.    metoprolol tartrate 50 MG tablet Commonly known as: LOPRESSOR Take 50 mg by mouth daily.   multivitamin with minerals Tabs tablet Take 1 tablet by mouth daily.   omeprazole 40 MG capsule Commonly known as: PRILOSEC Take 40 mg by mouth daily.   polyethylene glycol 17 g packet Commonly known as: MIRALAX / GLYCOLAX Take 17 g by mouth daily as needed for mild constipation.   potassium chloride 10 MEQ tablet Commonly known as: KLOR-CON Take 10 mEq by mouth daily.   predniSONE 5 MG tablet Commonly known as: DELTASONE Take 7.5 mg by mouth daily.   Walker Misc 1 Device by Does not apply route as directed.        Allergies:  Allergies  Allergen Reactions   Sulfa Antibiotics Nausea And Vomiting    Pt has not had a sulfa drug in years and is not certain of reaction but suspects nausea and vomiting.   Sulfasalazine Nausea And Vomiting    Family History: Family History  Problem Relation Age of Onset   Cancer Mother     Social History:  reports that she has never smoked. She has never used smokeless tobacco. She reports that she does not drink alcohol and does not use drugs.  ROS: Pertinent ROS in HPI  Physical Exam: Constitutional:  Well nourished. Alert and oriented, No acute distress. HEENT: [place] AT, mask in place.  Trachea midline Cardiovascular: No clubbing, cyanosis, or edema. Respiratory: Normal respiratory effort, no increased work of breathing. Neurologic: Grossly intact, no focal deficits, moving all 4 extremities. Psychiatric: Normal mood and affect.    Laboratory Data: Lab Results  Component Value Date   WBC 8.9 08/23/2021   HGB 9.5 (L) 08/23/2021   HCT 30.6 (L) 08/23/2021   MCV 89.2 08/23/2021   PLT 170 08/23/2021    Lab Results  Component Value Date   CREATININE 1.30 (H) 08/23/2021    Lab Results  Component Value Date   AST 26 08/19/2021   Lab Results  Component Value Date   ALT 15 08/19/2021  I have reviewed the labs.   Pertinent  Imaging:  09/14/21 14:49  Scan Result 41mL   Assessment & Plan:    1. Urinary retention -PVR is minimal, but I am concerned that she has not challenged her bladder adequately and she will go into retention during the night. -will have her return tomorrow morning for repeat PVR   Return for return tomorrow for PVR .  These notes generated with voice recognition software. I apologize for typographical errors.  Zara Council, PA-C  [facility] [address]  [place], [place] [address] [phone]

## 2021-09-14 ENCOUNTER — Ambulatory Visit: Payer: Medicare PPO | Admitting: *Deleted

## 2021-09-14 ENCOUNTER — Other Ambulatory Visit: Payer: Self-pay

## 2021-09-14 ENCOUNTER — Encounter: Payer: Self-pay | Admitting: Urology

## 2021-09-14 ENCOUNTER — Ambulatory Visit (INDEPENDENT_AMBULATORY_CARE_PROVIDER_SITE_OTHER): Payer: Medicare PPO | Admitting: Urology

## 2021-09-14 DIAGNOSIS — Z741 Need for assistance with personal care: Secondary | ICD-10-CM | POA: Diagnosis not present

## 2021-09-14 DIAGNOSIS — R338 Other retention of urine: Secondary | ICD-10-CM | POA: Diagnosis not present

## 2021-09-14 DIAGNOSIS — R279 Unspecified lack of coordination: Secondary | ICD-10-CM | POA: Diagnosis not present

## 2021-09-14 DIAGNOSIS — R41841 Cognitive communication deficit: Secondary | ICD-10-CM | POA: Diagnosis not present

## 2021-09-14 DIAGNOSIS — R2689 Other abnormalities of gait and mobility: Secondary | ICD-10-CM | POA: Diagnosis not present

## 2021-09-14 DIAGNOSIS — K754 Autoimmune hepatitis: Secondary | ICD-10-CM | POA: Diagnosis not present

## 2021-09-14 DIAGNOSIS — M6259 Muscle wasting and atrophy, not elsewhere classified, multiple sites: Secondary | ICD-10-CM | POA: Diagnosis not present

## 2021-09-14 DIAGNOSIS — M6281 Muscle weakness (generalized): Secondary | ICD-10-CM | POA: Diagnosis not present

## 2021-09-14 DIAGNOSIS — S7291XD Unspecified fracture of right femur, subsequent encounter for closed fracture with routine healing: Secondary | ICD-10-CM | POA: Diagnosis not present

## 2021-09-14 LAB — BLADDER SCAN AMB NON-IMAGING

## 2021-09-14 NOTE — Progress Notes (Signed)
Catheter Removal ? ?Patient is present today for a catheter removal.  7ml of water was drained from the balloon. A 16FR foley cath was removed from the bladder no complications were noted . Patient tolerated well. ? ?Performed by: Aleeza Bellville CMA  ? ?Follow up/ Additional notes: Afternoon appt.   ?

## 2021-09-15 ENCOUNTER — Ambulatory Visit (INDEPENDENT_AMBULATORY_CARE_PROVIDER_SITE_OTHER): Payer: Medicare PPO | Admitting: Urology

## 2021-09-15 ENCOUNTER — Ambulatory Visit: Payer: Medicare PPO | Admitting: Urology

## 2021-09-15 ENCOUNTER — Encounter: Payer: Self-pay | Admitting: Urology

## 2021-09-15 VITALS — Ht 60.0 in | Wt 127.0 lb

## 2021-09-15 DIAGNOSIS — M6259 Muscle wasting and atrophy, not elsewhere classified, multiple sites: Secondary | ICD-10-CM | POA: Diagnosis not present

## 2021-09-15 DIAGNOSIS — Z741 Need for assistance with personal care: Secondary | ICD-10-CM | POA: Diagnosis not present

## 2021-09-15 DIAGNOSIS — R41841 Cognitive communication deficit: Secondary | ICD-10-CM | POA: Diagnosis not present

## 2021-09-15 DIAGNOSIS — R279 Unspecified lack of coordination: Secondary | ICD-10-CM | POA: Diagnosis not present

## 2021-09-15 DIAGNOSIS — M6281 Muscle weakness (generalized): Secondary | ICD-10-CM | POA: Diagnosis not present

## 2021-09-15 DIAGNOSIS — R338 Other retention of urine: Secondary | ICD-10-CM

## 2021-09-15 DIAGNOSIS — R2689 Other abnormalities of gait and mobility: Secondary | ICD-10-CM | POA: Diagnosis not present

## 2021-09-15 LAB — BLADDER SCAN AMB NON-IMAGING: Scan Result: 227

## 2021-09-15 NOTE — Progress Notes (Signed)
? ? ?09/15/2021 ?8:10 AM  ? ?[name] [name] ?September 27, 1945 ?[id number] ? ?Referring provider: Margaretann Loveless, MD ?[address] ?[place],  [place] [address] ? ?Chief Complaint  ?Patient presents with  ? Other  ? ?Urological history: ?1. Renal cysts ?-contrast CT 07/2021 - stable small bilateral renal cysts ? ?HPI: ?[name] [name] is a 76 y.o. female who experienced post-operative urinary retention after undergoing a right hip hemiarthroplasty on 08/20/2021  who presents this morning for a second PVR after catheter removed yesterday and no spontaneous void reported by patient and a PVR of 16 mL.   ? ?She has voiding spontaneously.  She does not complain of suprapubic discomfort this morning.  Her PVR is 231 mL. ? ?PMH: ?Past Medical History:  ?Diagnosis Date  ? Abnormal LFTs (liver function tests)   ? Autoimmune hepatitis (HCC) 2018  ? Autoimmune hepatitis (HCC)   ? Autoimmune hepatitis treated with steroids (HCC)   ? DOE (dyspnea on exertion)   ? GERD (gastroesophageal reflux disease)   ? Hepatitis   ? Hyperlipidemia   ? Hypertension   ? Iron deficiency anemia due to chronic blood loss 05/27/2021  ? Osteoporosis   ? ? ?Surgical History: ?Past Surgical History:  ?Procedure Laterality Date  ? ABDOMINAL HYSTERECTOMY    ? COLONOSCOPY  10/06/2015  ? COLONOSCOPY WITH PROPOFOL N/A 08/27/2018  ? Procedure: COLONOSCOPY WITH PROPOFOL;  Surgeon: Scot Jun, MD;  Location: [facility] ENDOSCOPY;  Service: Endoscopy;  Laterality: N/A;  ? ESOPHAGOGASTRODUODENOSCOPY (EGD) WITH PROPOFOL N/A 08/27/2018  ? Procedure: ESOPHAGOGASTRODUODENOSCOPY (EGD) WITH PROPOFOL;  Surgeon: Scot Jun, MD;  Location: [facility] ENDOSCOPY;  Service: Endoscopy;  Laterality: N/A;  ? ESOPHAGOGASTRODUODENOSCOPY (EGD) WITH PROPOFOL N/A 05/21/2021  ? Procedure: ESOPHAGOGASTRODUODENOSCOPY (EGD) WITH PROPOFOL;  Surgeon: Regis Bill, MD;  Location: [facility] ENDOSCOPY;  Service: Endoscopy;  Laterality: N/A;  ? HIP ARTHROPLASTY Right 08/20/2021  ? Procedure:  ARTHROPLASTY BIPOLAR HIP (HEMIARTHROPLASTY);  Surgeon: Juanell Fairly, MD;  Location: [facility] ORS;  Service: Orthopedics;  Laterality: Right;  ? LIVER BIOPSY    ? ? ?Home Medications:  ?Allergies as of 09/15/2021   ? ?   Reactions  ? Sulfa Antibiotics Nausea And Vomiting  ? Pt has not had a sulfa drug in years and is not certain of reaction but suspects nausea and vomiting.  ? Sulfasalazine Nausea And Vomiting  ? ?  ? ?  ?Medication List  ?  ? ?  ? Accurate as of September 15, 2021  8:10 AM. If you have any questions, ask your nurse or doctor.  ?  ?  ? ?  ? ?acetaminophen 325 MG tablet ?Commonly known as: TYLENOL ?Take 2 tablets (650 mg total) by mouth every 6 (six) hours as needed for mild pain or moderate pain (pain score 1-3 or temp > 100.5). ?  ?aspirin EC 81 MG tablet ?Take 81 mg by mouth daily. ?  ?azaTHIOprine 50 MG tablet ?Commonly known as: IMURAN ?Take 25 mg by mouth daily. ?  ?Calcium Carbonate Antacid 600 MG chewable tablet ?Chew 600 mg by mouth. ?  ?calcium citrate-vitamin D 315-200 MG-UNIT tablet ?Commonly known as: CITRACAL+D ?Take 1 tablet by mouth 2 (two) times daily. ?  ?cyanocobalamin 100 MCG tablet ?Take 100 mcg by mouth daily. ?  ?enoxaparin 30 MG/0.3ML injection ?Commonly known as: LOVENOX ?Inject 0.3 mLs (30 mg total) into the skin daily for 14 days. ?  ?estradiol 0.1 MG/GM vaginal cream ?Commonly known as: ESTRACE ?Insert pea size amount daily x 2 weeks  then every other day x 2 weeks then 2-3 times weekly for maintenance ?  ?feeding supplement Liqd ?Take 237 mLs by mouth 2 (two) times daily between meals. ?  ?Ferrous Fumarate 324 (106 Fe) MG Tabs tablet ?Commonly known as: HEMOCYTE - 106 mg FE ?Take by mouth. ?  ?HYDROcodone-acetaminophen 5-325 MG tablet ?Commonly known as: NORCO/VICODIN ?Take 1 tablet by mouth every 6 (six) hours as needed for severe pain. ?  ?metoprolol tartrate 50 MG tablet ?Commonly known as: LOPRESSOR ?Take 50 mg by mouth daily. ?  ?multivitamin with minerals Tabs tablet ?Take 1  tablet by mouth daily. ?  ?omeprazole 40 MG capsule ?Commonly known as: PRILOSEC ?Take 40 mg by mouth daily. ?  ?polyethylene glycol 17 g packet ?Commonly known as: MIRALAX / GLYCOLAX ?Take 17 g by mouth daily as needed for mild constipation. ?  ?potassium chloride 10 MEQ tablet ?Commonly known as: KLOR-CON ?Take 10 mEq by mouth daily. ?  ?predniSONE 5 MG tablet ?Commonly known as: DELTASONE ?Take 7.5 mg by mouth daily. ?  ?Walker Misc ?1 Device by Does not apply route as directed. ?  ? ?  ? ? ?Allergies:  ?Allergies  ?Allergen Reactions  ? Sulfa Antibiotics Nausea And Vomiting  ?  Pt has not had a sulfa drug in years and is not certain of reaction but suspects nausea and vomiting.  ? Sulfasalazine Nausea And Vomiting  ? ? ?Family History: ?Family History  ?Problem Relation Age of Onset  ? Cancer Mother   ? ? ?Social History:  reports that she has never smoked. She has never used smokeless tobacco. She reports that she does not drink alcohol and does not use drugs. ? ?ROS: ?Pertinent ROS in HPI ? ?Physical Exam: ?Constitutional:  Well nourished. Alert and oriented, No acute distress. ?HEENT: [place] AT, mask in place trachea midline ?Cardiovascular: No clubbing, cyanosis, or edema. ?Respiratory: Normal respiratory effort, no increased work of breathing. ?Neurologic: Grossly intact, no focal deficits, moving all 4 extremities.  In wheelchair ?Psychiatric: Normal mood and affect.   ? ?Laboratory Data: ?N/A ? ?Pertinent Imaging: ? 09/15/21 07:56  ?Scan Result 227  ? ?Assessment & Plan:   ? ?1. Urinary retention ?-Reporting spontaneous voids ?-PVR less than 300 at today's visit and no suprapubic discomfort therefore will not place a Foley catheter at this time however if she should develop suprapubic discomfort and/or the inability to urinate her facility will need to place a catheter or take her to the emergency room or contact her office during office hours ? ?Return in about 1 month (around 10/16/2021) for PVR and OAB  questionnaire. ? ?These notes generated with voice recognition software. I apologize for typographical errors. ? ?Sharlisa Hollifield, PA-C ? ?[facility] ?[address]  [address] ?[place], [place] [address] ?[phone] ?  ?

## 2021-09-17 ENCOUNTER — Emergency Department: Payer: Medicare PPO

## 2021-09-17 ENCOUNTER — Other Ambulatory Visit: Payer: Self-pay

## 2021-09-17 ENCOUNTER — Encounter: Payer: Self-pay | Admitting: *Deleted

## 2021-09-17 ENCOUNTER — Inpatient Hospital Stay (HOSPITAL_COMMUNITY)
Admission: AD | Admit: 2021-09-17 | Payer: Medicare PPO | Source: Other Acute Inpatient Hospital | Admitting: Internal Medicine

## 2021-09-17 ENCOUNTER — Emergency Department
Admission: EM | Admit: 2021-09-17 | Discharge: 2021-09-18 | DRG: 536 | Disposition: A | Discharge status: Other Acute Inpatient Hospital | Payer: Medicare PPO | Attending: Obstetrics and Gynecology | Admitting: Obstetrics and Gynecology

## 2021-09-17 DIAGNOSIS — N1831 Chronic kidney disease, stage 3a: Secondary | ICD-10-CM | POA: Diagnosis not present

## 2021-09-17 DIAGNOSIS — Y92009 Unspecified place in unspecified non-institutional (private) residence as the place of occurrence of the external cause: Secondary | ICD-10-CM

## 2021-09-17 DIAGNOSIS — D696 Thrombocytopenia, unspecified: Secondary | ICD-10-CM | POA: Diagnosis present

## 2021-09-17 DIAGNOSIS — Z20822 Contact with and (suspected) exposure to covid-19: Secondary | ICD-10-CM | POA: Diagnosis present

## 2021-09-17 DIAGNOSIS — I129 Hypertensive chronic kidney disease with stage 1 through stage 4 chronic kidney disease, or unspecified chronic kidney disease: Secondary | ICD-10-CM | POA: Diagnosis present

## 2021-09-17 DIAGNOSIS — M79651 Pain in right thigh: Secondary | ICD-10-CM | POA: Diagnosis not present

## 2021-09-17 DIAGNOSIS — K219 Gastro-esophageal reflux disease without esophagitis: Secondary | ICD-10-CM | POA: Diagnosis not present

## 2021-09-17 DIAGNOSIS — E785 Hyperlipidemia, unspecified: Secondary | ICD-10-CM | POA: Diagnosis present

## 2021-09-17 DIAGNOSIS — Z79899 Other long term (current) drug therapy: Secondary | ICD-10-CM | POA: Diagnosis not present

## 2021-09-17 DIAGNOSIS — K754 Autoimmune hepatitis: Secondary | ICD-10-CM | POA: Diagnosis not present

## 2021-09-17 DIAGNOSIS — Z7982 Long term (current) use of aspirin: Secondary | ICD-10-CM | POA: Diagnosis not present

## 2021-09-17 DIAGNOSIS — W1830XA Fall on same level, unspecified, initial encounter: Secondary | ICD-10-CM | POA: Diagnosis present

## 2021-09-17 DIAGNOSIS — S72141A Displaced intertrochanteric fracture of right femur, initial encounter for closed fracture: Secondary | ICD-10-CM | POA: Diagnosis present

## 2021-09-17 DIAGNOSIS — Z96641 Presence of right artificial hip joint: Secondary | ICD-10-CM | POA: Diagnosis not present

## 2021-09-17 DIAGNOSIS — Z96649 Presence of unspecified artificial hip joint: Secondary | ICD-10-CM

## 2021-09-17 DIAGNOSIS — I10 Essential (primary) hypertension: Secondary | ICD-10-CM | POA: Diagnosis not present

## 2021-09-17 DIAGNOSIS — M978XXA Periprosthetic fracture around other internal prosthetic joint, initial encounter: Secondary | ICD-10-CM

## 2021-09-17 DIAGNOSIS — R52 Pain, unspecified: Secondary | ICD-10-CM | POA: Diagnosis not present

## 2021-09-17 DIAGNOSIS — K449 Diaphragmatic hernia without obstruction or gangrene: Secondary | ICD-10-CM | POA: Diagnosis not present

## 2021-09-17 DIAGNOSIS — Z882 Allergy status to sulfonamides status: Secondary | ICD-10-CM

## 2021-09-17 DIAGNOSIS — Z7952 Long term (current) use of systemic steroids: Secondary | ICD-10-CM | POA: Diagnosis not present

## 2021-09-17 DIAGNOSIS — S72142A Displaced intertrochanteric fracture of left femur, initial encounter for closed fracture: Secondary | ICD-10-CM | POA: Diagnosis not present

## 2021-09-17 DIAGNOSIS — W19XXXA Unspecified fall, initial encounter: Secondary | ICD-10-CM | POA: Diagnosis not present

## 2021-09-17 DIAGNOSIS — I959 Hypotension, unspecified: Secondary | ICD-10-CM | POA: Diagnosis not present

## 2021-09-17 DIAGNOSIS — J9811 Atelectasis: Secondary | ICD-10-CM | POA: Diagnosis not present

## 2021-09-17 DIAGNOSIS — R079 Chest pain, unspecified: Secondary | ICD-10-CM | POA: Diagnosis not present

## 2021-09-17 DIAGNOSIS — S72001A Fracture of unspecified part of neck of right femur, initial encounter for closed fracture: Secondary | ICD-10-CM

## 2021-09-17 DIAGNOSIS — S79921A Unspecified injury of right thigh, initial encounter: Secondary | ICD-10-CM | POA: Diagnosis not present

## 2021-09-17 LAB — RESP PANEL BY RT-PCR (FLU A&B, COVID) ARPGX2
Influenza A by PCR: NEGATIVE
Influenza B by PCR: NEGATIVE
SARS Coronavirus 2 by RT PCR: NEGATIVE

## 2021-09-17 LAB — URINALYSIS, COMPLETE (UACMP) WITH MICROSCOPIC
Bilirubin Urine: NEGATIVE
Glucose, UA: NEGATIVE mg/dL
Hgb urine dipstick: NEGATIVE
Ketones, ur: NEGATIVE mg/dL
Leukocytes,Ua: NEGATIVE
Nitrite: NEGATIVE
Protein, ur: NEGATIVE mg/dL
Specific Gravity, Urine: 1.015 (ref 1.005–1.030)
Squamous Epithelial / HPF: NONE SEEN (ref 0–5)
pH: 5 (ref 5.0–8.0)

## 2021-09-17 LAB — BASIC METABOLIC PANEL
Anion gap: 5 (ref 5–15)
BUN: 31 mg/dL — ABNORMAL HIGH (ref 8–23)
CO2: 23 mmol/L (ref 22–32)
Calcium: 9.2 mg/dL (ref 8.9–10.3)
Chloride: 107 mmol/L (ref 98–111)
Creatinine, Ser: 1.27 mg/dL — ABNORMAL HIGH (ref 0.44–1.00)
GFR, Estimated: 44 mL/min — ABNORMAL LOW (ref >60)
Glucose, Bld: 94 mg/dL (ref 70–99)
Potassium: 4.1 mmol/L (ref 3.5–5.1)
Sodium: 135 mmol/L (ref 135–145)

## 2021-09-17 LAB — CBC WITH DIFFERENTIAL/PLATELET
Abs Immature Granulocytes: 0.04 10*3/uL (ref 0.00–0.07)
Basophils Absolute: 0.1 10*3/uL (ref 0.0–0.1)
Basophils Relative: 1 %
Eosinophils Absolute: 0.3 10*3/uL (ref 0.0–0.5)
Eosinophils Relative: 4 %
HCT: 35.9 % — ABNORMAL LOW (ref 36.0–46.0)
Hemoglobin: 10.7 g/dL — ABNORMAL LOW (ref 12.0–15.0)
Immature Granulocytes: 1 %
Lymphocytes Relative: 12 %
Lymphs Abs: 0.8 10*3/uL (ref 0.7–4.0)
MCH: 28.1 pg (ref 26.0–34.0)
MCHC: 29.8 g/dL — ABNORMAL LOW (ref 30.0–36.0)
MCV: 94.2 fL (ref 80.0–100.0)
Monocytes Absolute: 0.8 10*3/uL (ref 0.1–1.0)
Monocytes Relative: 11 %
Neutro Abs: 4.8 10*3/uL (ref 1.7–7.7)
Neutrophils Relative %: 71 %
Platelets: 228 10*3/uL (ref 150–400)
RBC: 3.81 MIL/uL — ABNORMAL LOW (ref 3.87–5.11)
RDW: 17.6 % — ABNORMAL HIGH (ref 11.5–15.5)
WBC: 6.7 10*3/uL (ref 4.0–10.5)
nRBC: 0 % (ref 0.0–0.2)

## 2021-09-17 MED ORDER — ONDANSETRON HCL 4 MG/2ML IJ SOLN
4.0000 mg | Freq: Once | INTRAMUSCULAR | Status: AC
  Administered 2021-09-17: 4 mg via INTRAVENOUS
  Filled 2021-09-17: qty 2

## 2021-09-17 MED ORDER — FENTANYL CITRATE PF 50 MCG/ML IJ SOSY
50.0000 ug | PREFILLED_SYRINGE | Freq: Once | INTRAMUSCULAR | Status: AC
  Administered 2021-09-17: 50 ug via INTRAVENOUS
  Filled 2021-09-17: qty 1

## 2021-09-17 MED ORDER — MORPHINE SULFATE (PF) 4 MG/ML IV SOLN
4.0000 mg | Freq: Once | INTRAVENOUS | Status: AC
  Administered 2021-09-17: 4 mg via INTRAVENOUS
  Filled 2021-09-17: qty 1

## 2021-09-17 MED ORDER — MORPHINE SULFATE (PF) 4 MG/ML IV SOLN
4.0000 mg | INTRAVENOUS | Status: DC | PRN
  Administered 2021-09-17 – 2021-09-18 (×2): 4 mg via INTRAVENOUS
  Filled 2021-09-17 (×2): qty 1

## 2021-09-17 MED ORDER — LIDOCAINE HCL URETHRAL/MUCOSAL 2 % EX GEL
1.0000 | Freq: Once | CUTANEOUS | Status: AC
Start: 2021-09-17 — End: 2021-09-17
  Administered 2021-09-17: 1 via URETHRAL

## 2021-09-17 NOTE — ED Notes (Signed)
EDP at BS 

## 2021-09-17 NOTE — ED Notes (Signed)
Called [facility]a Bellman) to page ortho concerning pt waiting for bed assign. There are no available beds for ortho. ? Pt ED to ED ?

## 2021-09-17 NOTE — ED Notes (Signed)
Per [name] [name] @ [facility] still waiting for bed assignment ?

## 2021-09-17 NOTE — ED Notes (Signed)
Called [facility] spoke to [place] for transfer to [place] ?

## 2021-09-17 NOTE — ED Triage Notes (Signed)
BIB [facility] from home s/p fall onto R side. C/o R thigh pain with movement and palpation. Prefers lying on L side. No obvious shortening or rotation. D/c'd from rehab yesterday s/p R hip surgery from fall 1 month ago. CMS OK. Denies LOC, hitting head or blood thinners. Lives with husband who is legally blind. Daughter in [place]. Pt alert, NAD, calm, interactive.  ?

## 2021-09-17 NOTE — ED Notes (Signed)
Husband called for update. Pt is being transferred to [facility] for surgery.  ?[phone] - Husband - Baird Lyons ?

## 2021-09-17 NOTE — ED Notes (Signed)
Son would like to be, with mother's permission, main contact: Kimani Bedoya [phone]. Wife to be secondary, Laresha Bacorn [phone]. ?

## 2021-09-17 NOTE — ED Provider Notes (Addendum)
? ?[facility] ?Provider Note ? ? ? Event Date/Time  ? First MD Initiated Contact with Patient 09/17/21 1204   ?  (approximate) ? ? ?History  ? ?Fall ? ? ?HPI ? ?[name] [name] is a 76 y.o. female with a past medical history of autoimmune hepatitis, HTN, GERD, thrombocytopenia, hyponatremia, and recent admission 2/2 to 2/7 for closed right hip fracture status post right hemiarthroplasty on 2/3 who presents for evaluation of some new right hip pain after a fall that occurred this morning.  Patient states he got out of rehab yesterday.  She states she is using a walker to get around her house.  She said she thinks it got out from under her and that she fell onto her hip.  She did not hit her head or neck and is adamant she does not have any headache, neck pain, back pain, left hip pain or pain in either knee, ankle or the upper extremities.  He states otherwise he has been doing okay without any recent fevers, chills, cough, nausea, vomiting, urinary symptoms rash or other acute extremity pain. ? ?  ? ? ?Physical Exam  ?Triage Vital Signs: ?ED Triage Vitals  ?Enc Vitals Group  ?   BP --   ?   Pulse --   ?   Resp --   ?   Temp --   ?   Temp src --   ?   SpO2 09/17/21 1158 96 %  ?   Weight 09/17/21 1203 130 lb (59 kg)  ?   Height 09/17/21 1203 5\' 4"  (1.626 m)  ?   Head Circumference --   ?   Peak Flow --   ?   Pain Score 09/17/21 1203 0  ?   Pain Loc --   ?   Pain Edu? --   ?   Excl. in [place]? --   ? ? ?Most recent vital signs: ?Vitals:  ? 09/17/21 1300 09/17/21 1315  ?BP: (!) 138/53 128/63  ?Pulse: (!) 57 63  ?Resp: 19 (!) 23  ?Temp:    ?SpO2: 96% 97%  ? ? ?General: Awake, appears uncomfortable. ?CV:  Good peripheral perfusion.  2+ radial and DP pulses. ?Resp:  Normal effort.  ?Abd:  No distention.  Soft throughout. ?Other:  Decreased range of motion tenderness circumferentially about the right hip.  Patient is able to flex and extend at the right knee and at the right ankle.  There is full strength  of the left lower extremity in the bilateral upper extremities.  There is little bit of ecchymosis over the medial left malleolus which patient states is from a prior fall.  No other obvious trauma extremities.  No tenderness over the C/T/L-spine.  Cranial nerves II through XII are grossly intact. ? ? ?ED Results / Procedures / Treatments  ?Labs ?(all labs ordered are listed, but only abnormal results are displayed) ?Labs Reviewed  ?CBC WITH DIFFERENTIAL/PLATELET - Abnormal; Notable for the following components:  ?    Result Value  ? RBC 3.81 (*)   ? Hemoglobin 10.7 (*)   ? HCT 35.9 (*)   ? MCHC 29.8 (*)   ? RDW 17.6 (*)   ? All other components within normal limits  ?BASIC METABOLIC PANEL - Abnormal; Notable for the following components:  ? BUN 31 (*)   ? Creatinine, Ser 1.27 (*)   ? GFR, Estimated 44 (*)   ? All other components within normal limits  ?URINALYSIS, COMPLETE (  UACMP) WITH MICROSCOPIC - Abnormal; Notable for the following components:  ? Color, Urine YELLOW (*)   ? APPearance CLEAR (*)   ? Bacteria, UA RARE (*)   ? All other components within normal limits  ?RESP PANEL BY RT-PCR (FLU A&B, COVID) ARPGX2  ? ? ? ?EKG ? ?EKG is remarkable for sinus rhythm with a ventricular rate of 65, borderline left axis deviation, unremarkable intervals with nonspecific ST change in lateral and inferior leads without other clear evidence of acute ischemia or significant arrhythmia. ? ?These changes appear new compared to ECG from 2/2 which showed A-fib. ? ?RADIOLOGY ?Chest reviewed by myself shows no focal consoidation, effusion, edema, pneumothorax or other clear acute thoracic process. I also reviewed radiology interpretation and agree with findings described. ? ?Plain film of the pelvis and right femur shows evidence of a right-sided periprosthetic fracture with some minimal displacement medially.  No other acute pelvic fractures noted. ? ?PROCEDURES: ? ?Critical Care performed: No ? ?.1-3 Lead EKG  Interpretation ?Performed by: Lucrezia Starch, MD ?Authorized by: Lucrezia Starch, MD  ? ?  Interpretation: normal   ?  ECG rate assessment: normal   ?  Rhythm: sinus rhythm   ?  Ectopy: none   ?  Conduction: normal   ? ?The patient is on the cardiac monitor to evaluate for evidence of arrhythmia and/or significant heart rate changes. ? ? ?MEDICATIONS ORDERED IN ED: ?Medications  ?ondansetron (ZOFRAN) injection 4 mg (4 mg Intravenous Given 09/17/21 1250)  ?lidocaine (XYLOCAINE) 2 % jelly 1 application (1 application Urethral Given 09/17/21 1409)  ?fentaNYL (SUBLIMAZE) injection 50 mcg (50 mcg Intravenous Given 09/17/21 1400)  ? ? ? ?IMPRESSION / MDM / ASSESSMENT AND PLAN / ED COURSE  ?I reviewed the triage vital signs and the nursing notes. ?             ?               ? ?Differential diagnosis includes, but is not limited to contusion, hematoma and possible recurrent right hip fracture.  She is neurovascular intact distally and I have a low suspicion for compartment syndrome at this time. ? ?Plain film of the pelvis and right femur shows evidence of a right-sided periprosthetic fracture with some minimal displacement medially.  No other acute pelvic fractures noted. ? ?Preop chest x-ray and ECG are unremarkable.  CBC without leukocytosis and stable hemoglobin at 10.7 compared to 9.53 weeks ago.  Normal platelets.  BMP shows no significant electrolyte or metabolic derangements.  COVID influenza PCR is negative.  UA is unremarkable for evidence of cystitis. ? ?Reach out to unassigned orthopedist Dr. Posey Pronto who recommended reaching out to [facility] Dr. Sharlet Salina as he is a colleague of Dr. Harden Mo to see if Dr. Mack Guise would be available to help care for this patient.  We were informed that Dr. Sharlet Salina is in surgery. ? ?Care patient signed over to assuming father approximately 1500.  Plan is to follow-up with Dr. Sharlet Salina once he is out of surgery and likely admit even if nonoperative as patient is now living at  home but coming in with an acute injury I think she will likely require PT/OT/and pain control  for.  ? ?  ? ? ?FINAL CLINICAL IMPRESSION(S) / ED DIAGNOSES  ? ?Final diagnoses:  ?Fall, initial encounter  ?Closed fracture of right hip, initial encounter ([place])  ? ? ? ?Rx / DC Orders  ? ?ED Discharge Orders   ? ? None  ? ?  ? ? ? ?  Note:  This document was prepared using Dragon voice recognition software and may include unintentional dictation errors. ?  ?Lucrezia Starch, MD ?09/17/21 1523 ? ?  ?Lucrezia Starch, MD ?09/17/21 1524 ? ?

## 2021-09-17 NOTE — ED Notes (Signed)
Assisted Jon, RN with inserting foley and cleansing pt. Pt lying in bed with lights dimmed, no other needs voiced at this time. ?

## 2021-09-17 NOTE — ED Notes (Signed)
Per [facility] patient waiting for bed assignment per Lauren ?

## 2021-09-17 NOTE — ED Notes (Addendum)
Son leaving for the night. Son states that living conditions at mother's home are inadequate.  He is concerned that cluttered home w/'hoarding conditions'  leading to falls. States that he is working on finding parents an assisted living facility. He is concerned that mom will be sent home after rehab and insists that is not a safe option. ?

## 2021-09-17 NOTE — ED Notes (Signed)
Pt to xray via stretcher, alert, NAD, calm, interactive. No changes.  ?

## 2021-09-18 ENCOUNTER — Inpatient Hospital Stay (HOSPITAL_COMMUNITY): Payer: Medicare PPO

## 2021-09-18 ENCOUNTER — Inpatient Hospital Stay (HOSPITAL_COMMUNITY)
Admission: EM | Admit: 2021-09-18 | Discharge: 2021-09-24 | DRG: 467 | Disposition: A | Discharge status: Skilled Nursing Facility | Payer: Medicare PPO | Source: Other Acute Inpatient Hospital | Attending: Family Medicine | Admitting: Family Medicine

## 2021-09-18 ENCOUNTER — Encounter (HOSPITAL_COMMUNITY): Payer: Self-pay | Admitting: Internal Medicine

## 2021-09-18 ENCOUNTER — Inpatient Hospital Stay (HOSPITAL_COMMUNITY): Payer: Medicare PPO | Admitting: Anesthesiology

## 2021-09-18 ENCOUNTER — Encounter (HOSPITAL_COMMUNITY)
Admission: EM | Disposition: A | Discharge status: Skilled Nursing Facility | Payer: Self-pay | Source: Other Acute Inpatient Hospital | Attending: Family Medicine

## 2021-09-18 ENCOUNTER — Inpatient Hospital Stay: Payer: Medicare PPO

## 2021-09-18 ENCOUNTER — Other Ambulatory Visit: Payer: Self-pay

## 2021-09-18 DIAGNOSIS — M810 Age-related osteoporosis without current pathological fracture: Secondary | ICD-10-CM | POA: Diagnosis present

## 2021-09-18 DIAGNOSIS — Z881 Allergy status to other antibiotic agents status: Secondary | ICD-10-CM

## 2021-09-18 DIAGNOSIS — D696 Thrombocytopenia, unspecified: Secondary | ICD-10-CM | POA: Diagnosis not present

## 2021-09-18 DIAGNOSIS — E785 Hyperlipidemia, unspecified: Secondary | ICD-10-CM | POA: Diagnosis present

## 2021-09-18 DIAGNOSIS — S72001A Fracture of unspecified part of neck of right femur, initial encounter for closed fracture: Secondary | ICD-10-CM | POA: Insufficient documentation

## 2021-09-18 DIAGNOSIS — L89152 Pressure ulcer of sacral region, stage 2: Secondary | ICD-10-CM | POA: Diagnosis present

## 2021-09-18 DIAGNOSIS — I129 Hypertensive chronic kidney disease with stage 1 through stage 4 chronic kidney disease, or unspecified chronic kidney disease: Secondary | ICD-10-CM | POA: Diagnosis not present

## 2021-09-18 DIAGNOSIS — M978XXA Periprosthetic fracture around other internal prosthetic joint, initial encounter: Secondary | ICD-10-CM

## 2021-09-18 DIAGNOSIS — Z09 Encounter for follow-up examination after completed treatment for conditions other than malignant neoplasm: Secondary | ICD-10-CM

## 2021-09-18 DIAGNOSIS — M50222 Other cervical disc displacement at C5-C6 level: Secondary | ICD-10-CM | POA: Diagnosis not present

## 2021-09-18 DIAGNOSIS — D631 Anemia in chronic kidney disease: Secondary | ICD-10-CM | POA: Diagnosis present

## 2021-09-18 DIAGNOSIS — E860 Dehydration: Secondary | ICD-10-CM | POA: Diagnosis present

## 2021-09-18 DIAGNOSIS — Z882 Allergy status to sulfonamides status: Secondary | ICD-10-CM | POA: Diagnosis not present

## 2021-09-18 DIAGNOSIS — Z79899 Other long term (current) drug therapy: Secondary | ICD-10-CM

## 2021-09-18 DIAGNOSIS — M9701XA Periprosthetic fracture around internal prosthetic right hip joint, initial encounter: Secondary | ICD-10-CM

## 2021-09-18 DIAGNOSIS — D72829 Elevated white blood cell count, unspecified: Secondary | ICD-10-CM | POA: Diagnosis present

## 2021-09-18 DIAGNOSIS — D849 Immunodeficiency, unspecified: Secondary | ICD-10-CM | POA: Diagnosis not present

## 2021-09-18 DIAGNOSIS — Z66 Do not resuscitate: Secondary | ICD-10-CM | POA: Diagnosis present

## 2021-09-18 DIAGNOSIS — I10 Essential (primary) hypertension: Secondary | ICD-10-CM | POA: Diagnosis not present

## 2021-09-18 DIAGNOSIS — Z96641 Presence of right artificial hip joint: Secondary | ICD-10-CM | POA: Diagnosis not present

## 2021-09-18 DIAGNOSIS — Z7982 Long term (current) use of aspirin: Secondary | ICD-10-CM

## 2021-09-18 DIAGNOSIS — R7301 Impaired fasting glucose: Secondary | ICD-10-CM | POA: Diagnosis present

## 2021-09-18 DIAGNOSIS — W19XXXA Unspecified fall, initial encounter: Secondary | ICD-10-CM | POA: Diagnosis not present

## 2021-09-18 DIAGNOSIS — M50223 Other cervical disc displacement at C6-C7 level: Secondary | ICD-10-CM | POA: Diagnosis not present

## 2021-09-18 DIAGNOSIS — W010XXA Fall on same level from slipping, tripping and stumbling without subsequent striking against object, initial encounter: Secondary | ICD-10-CM | POA: Diagnosis present

## 2021-09-18 DIAGNOSIS — K219 Gastro-esophageal reflux disease without esophagitis: Secondary | ICD-10-CM | POA: Diagnosis present

## 2021-09-18 DIAGNOSIS — S72141A Displaced intertrochanteric fracture of right femur, initial encounter for closed fracture: Principal | ICD-10-CM | POA: Diagnosis present

## 2021-09-18 DIAGNOSIS — M9701XD Periprosthetic fracture around internal prosthetic right hip joint, subsequent encounter: Secondary | ICD-10-CM | POA: Diagnosis not present

## 2021-09-18 DIAGNOSIS — Z90710 Acquired absence of both cervix and uterus: Secondary | ICD-10-CM

## 2021-09-18 DIAGNOSIS — Z20822 Contact with and (suspected) exposure to covid-19: Secondary | ICD-10-CM | POA: Diagnosis not present

## 2021-09-18 DIAGNOSIS — M50221 Other cervical disc displacement at C4-C5 level: Secondary | ICD-10-CM | POA: Diagnosis not present

## 2021-09-18 DIAGNOSIS — R569 Unspecified convulsions: Secondary | ICD-10-CM | POA: Diagnosis not present

## 2021-09-18 DIAGNOSIS — N1831 Chronic kidney disease, stage 3a: Secondary | ICD-10-CM | POA: Diagnosis not present

## 2021-09-18 DIAGNOSIS — E871 Hypo-osmolality and hyponatremia: Secondary | ICD-10-CM | POA: Diagnosis present

## 2021-09-18 DIAGNOSIS — Z743 Need for continuous supervision: Secondary | ICD-10-CM | POA: Diagnosis not present

## 2021-09-18 DIAGNOSIS — Z471 Aftercare following joint replacement surgery: Secondary | ICD-10-CM | POA: Diagnosis not present

## 2021-09-18 DIAGNOSIS — S22000A Wedge compression fracture of unspecified thoracic vertebra, initial encounter for closed fracture: Secondary | ICD-10-CM | POA: Diagnosis not present

## 2021-09-18 DIAGNOSIS — D649 Anemia, unspecified: Secondary | ICD-10-CM | POA: Diagnosis not present

## 2021-09-18 DIAGNOSIS — Z96649 Presence of unspecified artificial hip joint: Secondary | ICD-10-CM

## 2021-09-18 DIAGNOSIS — Z419 Encounter for procedure for purposes other than remedying health state, unspecified: Secondary | ICD-10-CM

## 2021-09-18 DIAGNOSIS — K754 Autoimmune hepatitis: Secondary | ICD-10-CM | POA: Diagnosis not present

## 2021-09-18 DIAGNOSIS — M9702XA Periprosthetic fracture around internal prosthetic left hip joint, initial encounter: Secondary | ICD-10-CM | POA: Diagnosis not present

## 2021-09-18 DIAGNOSIS — S79929A Unspecified injury of unspecified thigh, initial encounter: Secondary | ICD-10-CM | POA: Diagnosis not present

## 2021-09-18 DIAGNOSIS — L8990 Pressure ulcer of unspecified site, unspecified stage: Secondary | ICD-10-CM | POA: Diagnosis present

## 2021-09-18 HISTORY — PX: TOTAL HIP REVISION: SHX763

## 2021-09-18 LAB — COMPREHENSIVE METABOLIC PANEL
ALT: 14 U/L (ref 0–44)
AST: 22 U/L (ref 15–41)
Albumin: 2.8 g/dL — ABNORMAL LOW (ref 3.5–5.0)
Alkaline Phosphatase: 40 U/L (ref 38–126)
Anion gap: 9 (ref 5–15)
BUN: 24 mg/dL — ABNORMAL HIGH (ref 8–23)
CO2: 18 mmol/L — ABNORMAL LOW (ref 22–32)
Calcium: 7.9 mg/dL — ABNORMAL LOW (ref 8.9–10.3)
Chloride: 104 mmol/L (ref 98–111)
Creatinine, Ser: 0.73 mg/dL (ref 0.44–1.00)
GFR, Estimated: >60 mL/min (ref >60)
Glucose, Bld: 201 mg/dL — ABNORMAL HIGH (ref 70–99)
Potassium: 5 mmol/L (ref 3.5–5.1)
Sodium: 131 mmol/L — ABNORMAL LOW (ref 135–145)
Total Bilirubin: 1.9 mg/dL — ABNORMAL HIGH (ref 0.3–1.2)
Total Protein: 4.9 g/dL — ABNORMAL LOW (ref 6.5–8.1)

## 2021-09-18 LAB — CBC WITH DIFFERENTIAL/PLATELET
Abs Immature Granulocytes: 0.21 10*3/uL — ABNORMAL HIGH (ref 0.00–0.07)
Basophils Absolute: 0.1 10*3/uL (ref 0.0–0.1)
Basophils Relative: 0 %
Eosinophils Absolute: 0 10*3/uL (ref 0.0–0.5)
Eosinophils Relative: 0 %
HCT: 39.4 % (ref 36.0–46.0)
Hemoglobin: 12.3 g/dL (ref 12.0–15.0)
Immature Granulocytes: 1 %
Lymphocytes Relative: 6 %
Lymphs Abs: 1.3 10*3/uL (ref 0.7–4.0)
MCH: 28.3 pg (ref 26.0–34.0)
MCHC: 31.2 g/dL (ref 30.0–36.0)
MCV: 90.6 fL (ref 80.0–100.0)
Monocytes Absolute: 1.2 10*3/uL — ABNORMAL HIGH (ref 0.1–1.0)
Monocytes Relative: 6 %
Neutro Abs: 18.6 10*3/uL — ABNORMAL HIGH (ref 1.7–7.7)
Neutrophils Relative %: 87 %
Platelets: 174 10*3/uL (ref 150–400)
RBC: 4.35 MIL/uL (ref 3.87–5.11)
RDW: 16.8 % — ABNORMAL HIGH (ref 11.5–15.5)
WBC: 21.4 10*3/uL — ABNORMAL HIGH (ref 4.0–10.5)
nRBC: 0 % (ref 0.0–0.2)

## 2021-09-18 LAB — CBC
HCT: 38.7 % (ref 36.0–46.0)
Hemoglobin: 12.3 g/dL (ref 12.0–15.0)
MCH: 28.9 pg (ref 26.0–34.0)
MCHC: 31.8 g/dL (ref 30.0–36.0)
MCV: 90.8 fL (ref 80.0–100.0)
Platelets: 188 10*3/uL (ref 150–400)
RBC: 4.26 MIL/uL (ref 3.87–5.11)
RDW: 17 % — ABNORMAL HIGH (ref 11.5–15.5)
WBC: 24.2 10*3/uL — ABNORMAL HIGH (ref 4.0–10.5)
nRBC: 0 % (ref 0.0–0.2)

## 2021-09-18 LAB — PREPARE RBC (CROSSMATCH)

## 2021-09-18 SURGERY — TOTAL HIP REVISION
Anesthesia: General | Site: Hip | Laterality: Right

## 2021-09-18 MED ORDER — HYDROCODONE-ACETAMINOPHEN 5-325 MG PO TABS
1.0000 | ORAL_TABLET | ORAL | Status: DC | PRN
  Administered 2021-09-23: 13:00:00 1 via ORAL
  Filled 2021-09-18 (×2): qty 2

## 2021-09-18 MED ORDER — FENTANYL CITRATE (PF) 250 MCG/5ML IJ SOLN
INTRAMUSCULAR | Status: AC
  Filled 2021-09-18: qty 5

## 2021-09-18 MED ORDER — METOCLOPRAMIDE HCL 5 MG/ML IJ SOLN
5.0000 mg | Freq: Three times a day (TID) | INTRAMUSCULAR | Status: DC | PRN
  Filled 2021-09-18: qty 2

## 2021-09-18 MED ORDER — PROPOFOL 10 MG/ML IV BOLUS
INTRAVENOUS | Status: AC
  Filled 2021-09-18: qty 20

## 2021-09-18 MED ORDER — FENTANYL CITRATE (PF) 100 MCG/2ML IJ SOLN
INTRAMUSCULAR | Status: DC | PRN
  Administered 2021-09-18 (×2): 50 ug via INTRAVENOUS

## 2021-09-18 MED ORDER — ACETAMINOPHEN 325 MG PO TABS: 650.0000 mg | ORAL_TABLET | Freq: Four times a day (QID) | ORAL | Status: DC | PRN

## 2021-09-18 MED ORDER — ACETAMINOPHEN 10 MG/ML IV SOLN
INTRAVENOUS | Status: DC | PRN
Start: 2021-09-18 — End: 2021-09-18
  Administered 2021-09-18: 1000 mg via INTRAVENOUS

## 2021-09-18 MED ORDER — DIPHENHYDRAMINE HCL 12.5 MG/5ML PO ELIX: 12.5000 mg | ORAL_SOLUTION | ORAL | Status: DC | PRN

## 2021-09-18 MED ORDER — PANTOPRAZOLE SODIUM 40 MG PO TBEC
80.0000 mg | DELAYED_RELEASE_TABLET | Freq: Every day | ORAL | Status: DC
  Administered 2021-09-18 – 2021-09-24 (×6): 80 mg via ORAL
  Filled 2021-09-18 (×6): qty 2

## 2021-09-18 MED ORDER — CALCIUM CITRATE-VITAMIN D 315-200 MG-UNIT PO TABS: 1.0000 | ORAL_TABLET | Freq: Two times a day (BID) | ORAL | Status: DC

## 2021-09-18 MED ORDER — 0.9 % SODIUM CHLORIDE (POUR BTL) OPTIME
TOPICAL | Status: DC | PRN
  Administered 2021-09-18: 1000 mL

## 2021-09-18 MED ORDER — CHOLECALCIFEROL 10 MCG (400 UNIT) PO TABS
200.0000 [IU] | ORAL_TABLET | Freq: Two times a day (BID) | ORAL | Status: DC
Start: 2021-09-18 — End: 2021-09-24
  Administered 2021-09-18 – 2021-09-24 (×11): 200 [IU] via ORAL
  Filled 2021-09-18 (×11): qty 1

## 2021-09-18 MED ORDER — TRANEXAMIC ACID-NACL 1000-0.7 MG/100ML-% IV SOLN
1000.0000 mg | Freq: Once | INTRAVENOUS | Status: AC
  Administered 2021-09-18: 1000 mg via INTRAVENOUS
  Filled 2021-09-18: qty 100

## 2021-09-18 MED ORDER — KETOROLAC TROMETHAMINE 30 MG/ML IJ SOLN
INTRAMUSCULAR | Status: AC
  Filled 2021-09-18: qty 1

## 2021-09-18 MED ORDER — METOCLOPRAMIDE HCL 5 MG PO TABS: 5.0000 mg | ORAL_TABLET | Freq: Three times a day (TID) | ORAL | Status: DC | PRN

## 2021-09-18 MED ORDER — OXYCODONE HCL 5 MG/5ML PO SOLN: 5.0000 mg | Freq: Once | ORAL | Status: DC | PRN

## 2021-09-18 MED ORDER — PHENYLEPHRINE 40 MCG/ML (10ML) SYRINGE FOR IV PUSH (FOR BLOOD PRESSURE SUPPORT)
PREFILLED_SYRINGE | INTRAVENOUS | Status: DC | PRN
Start: 2021-09-18 — End: 2021-09-18
  Administered 2021-09-18 (×2): 120 ug via INTRAVENOUS
  Administered 2021-09-18: 160 ug via INTRAVENOUS
  Administered 2021-09-18: 80 ug via INTRAVENOUS
  Administered 2021-09-18 (×2): 120 ug via INTRAVENOUS
  Administered 2021-09-18: 80 ug via INTRAVENOUS
  Administered 2021-09-18: 160 ug via INTRAVENOUS

## 2021-09-18 MED ORDER — ALBUMIN HUMAN 5 % IV SOLN: INTRAVENOUS | Status: DC | PRN

## 2021-09-18 MED ORDER — CALCIUM CARBONATE ANTACID 600 MG PO CHEW: 600.0000 mg | CHEWABLE_TABLET | Freq: Every day | ORAL | Status: DC

## 2021-09-18 MED ORDER — MORPHINE SULFATE (PF) 2 MG/ML IV SOLN
0.5000 mg | INTRAVENOUS | Status: DC | PRN
  Administered 2021-09-18: 0.5 mg via INTRAVENOUS
  Filled 2021-09-18: qty 1

## 2021-09-18 MED ORDER — MENTHOL 3 MG MT LOZG: 1.0000 | LOZENGE | OROMUCOSAL | Status: DC | PRN

## 2021-09-18 MED ORDER — METOPROLOL TARTRATE 50 MG PO TABS
50.0000 mg | ORAL_TABLET | Freq: Every day | ORAL | Status: DC
  Administered 2021-09-18 – 2021-09-24 (×7): 50 mg via ORAL
  Filled 2021-09-18 (×7): qty 1

## 2021-09-18 MED ORDER — ISOPROPYL ALCOHOL 70 % SOLN
Status: AC
  Filled 2021-09-18: qty 480

## 2021-09-18 MED ORDER — FENTANYL CITRATE PF 50 MCG/ML IJ SOSY
25.0000 ug | PREFILLED_SYRINGE | INTRAMUSCULAR | Status: DC | PRN
  Administered 2021-09-18 (×3): 50 ug via INTRAVENOUS

## 2021-09-18 MED ORDER — DOCUSATE SODIUM 100 MG PO CAPS
100.0000 mg | ORAL_CAPSULE | Freq: Two times a day (BID) | ORAL | Status: DC
  Administered 2021-09-18 – 2021-09-24 (×7): 100 mg via ORAL
  Filled 2021-09-18 (×10): qty 1

## 2021-09-18 MED ORDER — SODIUM CHLORIDE 0.9 % IV SOLN: INTRAVENOUS | Status: DC

## 2021-09-18 MED ORDER — POLYETHYLENE GLYCOL 3350 17 G PO PACK: 17.0000 g | PACK | Freq: Every day | ORAL | Status: DC | PRN

## 2021-09-18 MED ORDER — VITAMIN B-12 100 MCG PO TABS: 100.0000 ug | ORAL_TABLET | Freq: Every day | ORAL | Status: DC

## 2021-09-18 MED ORDER — SODIUM CHLORIDE 0.9 % IR SOLN
Status: DC | PRN
  Administered 2021-09-18: 3000 mL
  Administered 2021-09-18: 1000 mL

## 2021-09-18 MED ORDER — HYDROCODONE-ACETAMINOPHEN 5-325 MG PO TABS
1.0000 | ORAL_TABLET | Freq: Four times a day (QID) | ORAL | Status: DC | PRN
  Administered 2021-09-21: 2 via ORAL
  Administered 2021-09-22: 1 via ORAL
  Administered 2021-09-22: 2 via ORAL
  Filled 2021-09-18: qty 2
  Filled 2021-09-18 (×3): qty 1

## 2021-09-18 MED ORDER — SODIUM CHLORIDE 0.9% IV SOLUTION: Freq: Once | INTRAVENOUS | Status: DC

## 2021-09-18 MED ORDER — ONDANSETRON HCL 4 MG PO TABS: 4.0000 mg | ORAL_TABLET | Freq: Four times a day (QID) | ORAL | Status: DC | PRN

## 2021-09-18 MED ORDER — PROCHLORPERAZINE EDISYLATE 10 MG/2ML IJ SOLN
10.0000 mg | Freq: Four times a day (QID) | INTRAMUSCULAR | Status: DC | PRN
Start: 2021-09-18 — End: 2021-09-18

## 2021-09-18 MED ORDER — FENTANYL CITRATE PF 50 MCG/ML IJ SOSY
PREFILLED_SYRINGE | INTRAMUSCULAR | Status: AC
  Filled 2021-09-18: qty 3

## 2021-09-18 MED ORDER — SODIUM CHLORIDE (PF) 0.9 % IJ SOLN
INTRAMUSCULAR | Status: AC
  Filled 2021-09-18: qty 50

## 2021-09-18 MED ORDER — CEFAZOLIN SODIUM-DEXTROSE 2-4 GM/100ML-% IV SOLN
2.0000 g | Freq: Four times a day (QID) | INTRAVENOUS | Status: AC
  Administered 2021-09-18 – 2021-09-19 (×2): 2 g via INTRAVENOUS
  Filled 2021-09-18 (×2): qty 100

## 2021-09-18 MED ORDER — CALCIUM CITRATE 950 (200 CA) MG PO TABS
950.0000 mg | ORAL_TABLET | Freq: Two times a day (BID) | ORAL | Status: DC
  Filled 2021-09-18: qty 1

## 2021-09-18 MED ORDER — METOPROLOL TARTRATE 50 MG PO TABS: 50.0000 mg | ORAL_TABLET | Freq: Every day | ORAL | Status: DC

## 2021-09-18 MED ORDER — PHENYLEPHRINE HCL-NACL 20-0.9 MG/250ML-% IV SOLN
INTRAVENOUS | Status: DC | PRN
  Administered 2021-09-18: 50 ug/min via INTRAVENOUS

## 2021-09-18 MED ORDER — CHOLECALCIFEROL 10 MCG (400 UNIT) PO TABS
400.0000 [IU] | ORAL_TABLET | Freq: Every day | ORAL | Status: DC
  Filled 2021-09-18: qty 1

## 2021-09-18 MED ORDER — ROCURONIUM BROMIDE 10 MG/ML (PF) SYRINGE
PREFILLED_SYRINGE | INTRAVENOUS | Status: DC | PRN
Start: 2021-09-18 — End: 2021-09-18
  Administered 2021-09-18: 20 mg via INTRAVENOUS
  Administered 2021-09-18: 30 mg via INTRAVENOUS
  Administered 2021-09-18: 50 mg via INTRAVENOUS

## 2021-09-18 MED ORDER — PHENYLEPHRINE HCL (PRESSORS) 10 MG/ML IV SOLN
INTRAVENOUS | Status: AC
  Filled 2021-09-18: qty 1

## 2021-09-18 MED ORDER — POVIDONE-IODINE 10 % EX SWAB: 2.0000 "application " | Freq: Once | CUTANEOUS | Status: DC

## 2021-09-18 MED ORDER — CEFAZOLIN SODIUM-DEXTROSE 2-4 GM/100ML-% IV SOLN
INTRAVENOUS | Status: AC
  Filled 2021-09-18: qty 100

## 2021-09-18 MED ORDER — CEFAZOLIN SODIUM-DEXTROSE 2-4 GM/100ML-% IV SOLN
2.0000 g | INTRAVENOUS | Status: AC
  Administered 2021-09-18: 2 g via INTRAVENOUS

## 2021-09-18 MED ORDER — ACETAMINOPHEN 160 MG/5ML PO SOLN: 1000.0000 mg | Freq: Once | ORAL | Status: DC | PRN

## 2021-09-18 MED ORDER — STERILE WATER FOR IRRIGATION IR SOLN
Status: DC | PRN
  Administered 2021-09-18: 2000 mL

## 2021-09-18 MED ORDER — OXYCODONE HCL 5 MG PO TABS: 5.0000 mg | ORAL_TABLET | Freq: Once | ORAL | Status: DC | PRN

## 2021-09-18 MED ORDER — DEXAMETHASONE SODIUM PHOSPHATE 10 MG/ML IJ SOLN
INTRAMUSCULAR | Status: DC | PRN
  Administered 2021-09-18: 10 mg via INTRAVENOUS

## 2021-09-18 MED ORDER — ACETAMINOPHEN 10 MG/ML IV SOLN: 1000.0000 mg | Freq: Once | INTRAVENOUS | Status: DC | PRN

## 2021-09-18 MED ORDER — ENSURE ENLIVE PO LIQD: 237.0000 mL | Freq: Two times a day (BID) | ORAL | Status: DC

## 2021-09-18 MED ORDER — CHLORHEXIDINE GLUCONATE 4 % EX LIQD: 60.0000 mL | Freq: Once | CUTANEOUS | Status: DC

## 2021-09-18 MED ORDER — LACTATED RINGERS IV SOLN: INTRAVENOUS | Status: DC | PRN

## 2021-09-18 MED ORDER — METHOCARBAMOL 500 MG PO TABS
500.0000 mg | ORAL_TABLET | Freq: Four times a day (QID) | ORAL | Status: DC | PRN
  Administered 2021-09-20 – 2021-09-22 (×2): 500 mg via ORAL
  Filled 2021-09-18 (×4): qty 1

## 2021-09-18 MED ORDER — VITAMIN B-12 100 MCG PO TABS
100.0000 ug | ORAL_TABLET | Freq: Every day | ORAL | Status: DC
  Administered 2021-09-20 – 2021-09-24 (×5): 100 ug via ORAL
  Filled 2021-09-18 (×6): qty 1

## 2021-09-18 MED ORDER — ADULT MULTIVITAMIN W/MINERALS CH
1.0000 | ORAL_TABLET | Freq: Every day | ORAL | Status: DC
  Administered 2021-09-18 – 2021-09-24 (×6): 1 via ORAL
  Filled 2021-09-18 (×6): qty 1

## 2021-09-18 MED ORDER — IRRISEPT - 450ML BOTTLE WITH 0.05% CHG IN STERILE WATER, USP 99.95% OPTIME
TOPICAL | Status: DC | PRN
  Administered 2021-09-18: 450 mL

## 2021-09-18 MED ORDER — PHENOL 1.4 % MT LIQD: 1.0000 | OROMUCOSAL | Status: DC | PRN

## 2021-09-18 MED ORDER — ACETAMINOPHEN 500 MG PO TABS: 1000.0000 mg | ORAL_TABLET | Freq: Once | ORAL | Status: DC | PRN

## 2021-09-18 MED ORDER — ALUM & MAG HYDROXIDE-SIMETH 200-200-20 MG/5ML PO SUSP: 30.0000 mL | ORAL | Status: DC | PRN

## 2021-09-18 MED ORDER — AZATHIOPRINE 50 MG PO TABS
25.0000 mg | ORAL_TABLET | Freq: Every day | ORAL | Status: DC
  Administered 2021-09-18 – 2021-09-24 (×7): 25 mg via ORAL
  Filled 2021-09-18 (×8): qty 1

## 2021-09-18 MED ORDER — LIDOCAINE 2% (20 MG/ML) 5 ML SYRINGE
INTRAMUSCULAR | Status: DC | PRN
  Administered 2021-09-18: 40 mg via INTRAVENOUS

## 2021-09-18 MED ORDER — SENNA 8.6 MG PO TABS
1.0000 | ORAL_TABLET | Freq: Two times a day (BID) | ORAL | Status: DC
  Administered 2021-09-18 – 2021-09-24 (×11): 8.6 mg via ORAL
  Filled 2021-09-18 (×11): qty 1

## 2021-09-18 MED ORDER — ASPIRIN 81 MG PO CHEW
81.0000 mg | CHEWABLE_TABLET | Freq: Two times a day (BID) | ORAL | Status: DC
  Administered 2021-09-18 – 2021-09-24 (×12): 81 mg via ORAL
  Filled 2021-09-18 (×12): qty 1

## 2021-09-18 MED ORDER — PROPOFOL 10 MG/ML IV BOLUS
INTRAVENOUS | Status: DC | PRN
  Administered 2021-09-18: 100 mg via INTRAVENOUS

## 2021-09-18 MED ORDER — VASOPRESSIN 20 UNIT/ML IV SOLN
INTRAVENOUS | Status: DC | PRN
Start: 2021-09-18 — End: 2021-09-18
  Administered 2021-09-18 (×2): 1 [IU] via INTRAVENOUS

## 2021-09-18 MED ORDER — TRANEXAMIC ACID-NACL 1000-0.7 MG/100ML-% IV SOLN
1000.0000 mg | INTRAVENOUS | Status: AC
  Administered 2021-09-18: 1000 mg via INTRAVENOUS
  Filled 2021-09-18: qty 100

## 2021-09-18 MED ORDER — HYDROCODONE-ACETAMINOPHEN 7.5-325 MG PO TABS
1.0000 | ORAL_TABLET | ORAL | Status: DC | PRN
  Administered 2021-09-19: 1 via ORAL
  Filled 2021-09-18: qty 1

## 2021-09-18 MED ORDER — CALCIUM CITRATE 950 (200 CA) MG PO TABS
200.0000 mg | ORAL_TABLET | Freq: Two times a day (BID) | ORAL | Status: DC
  Administered 2021-09-18 – 2021-09-24 (×11): 200 mg via ORAL
  Filled 2021-09-18 (×13): qty 1

## 2021-09-18 MED ORDER — MORPHINE SULFATE (PF) 2 MG/ML IV SOLN
0.5000 mg | INTRAVENOUS | Status: DC | PRN
  Administered 2021-09-18 – 2021-09-19 (×2): 1 mg via INTRAVENOUS
  Filled 2021-09-18 (×2): qty 1

## 2021-09-18 MED ORDER — PANTOPRAZOLE SODIUM 40 MG PO TBEC: 40.0000 mg | DELAYED_RELEASE_TABLET | Freq: Every day | ORAL | Status: DC

## 2021-09-18 MED ORDER — ONDANSETRON HCL 4 MG/2ML IJ SOLN
4.0000 mg | Freq: Four times a day (QID) | INTRAMUSCULAR | Status: DC | PRN
  Administered 2021-09-18: 4 mg via INTRAVENOUS
  Filled 2021-09-18: qty 2

## 2021-09-18 MED ORDER — ACETAMINOPHEN 325 MG PO TABS
325.0000 mg | ORAL_TABLET | Freq: Four times a day (QID) | ORAL | Status: DC | PRN
  Administered 2021-09-20 – 2021-09-22 (×2): 650 mg via ORAL
  Filled 2021-09-18 (×2): qty 2

## 2021-09-18 MED ORDER — SUGAMMADEX SODIUM 200 MG/2ML IV SOLN
INTRAVENOUS | Status: DC | PRN
  Administered 2021-09-18: 150 mg via INTRAVENOUS

## 2021-09-18 MED ORDER — SULFAMETHOXAZOLE-TRIMETHOPRIM 800-160 MG PO TABS: 1.0000 | ORAL_TABLET | Freq: Every day | ORAL | Status: DC

## 2021-09-18 MED ORDER — VITAMIN B-12 100 MCG PO TABS
100.0000 ug | ORAL_TABLET | Freq: Every day | ORAL | Status: DC
  Filled 2021-09-18: qty 1

## 2021-09-18 MED ORDER — METHOCARBAMOL 500 MG IVPB - SIMPLE MED
500.0000 mg | Freq: Four times a day (QID) | INTRAVENOUS | Status: DC | PRN
  Administered 2021-09-18 – 2021-09-19 (×2): 500 mg via INTRAVENOUS
  Filled 2021-09-18 (×2): qty 500

## 2021-09-18 MED ORDER — PREDNISONE 5 MG PO TABS
7.5000 mg | ORAL_TABLET | Freq: Every day | ORAL | Status: DC
  Administered 2021-09-19 – 2021-09-24 (×6): 7.5 mg via ORAL
  Filled 2021-09-18 (×6): qty 2

## 2021-09-18 MED ORDER — CALCIUM CARBONATE ANTACID 500 MG PO CHEW
1.0000 | CHEWABLE_TABLET | Freq: Every day | ORAL | Status: DC
  Administered 2021-09-20 – 2021-09-24 (×5): 200 mg via ORAL
  Filled 2021-09-18 (×5): qty 1

## 2021-09-18 MED ORDER — HYDROCORTISONE SOD SUC (PF) 100 MG IJ SOLR
100.0000 mg | Freq: Once | INTRAMUSCULAR | Status: AC
  Administered 2021-09-18: 100 mg via INTRAVENOUS
  Filled 2021-09-18: qty 2

## 2021-09-18 MED ORDER — ONDANSETRON HCL 4 MG/2ML IJ SOLN
INTRAMUSCULAR | Status: DC | PRN
  Administered 2021-09-18: 4 mg via INTRAVENOUS

## 2021-09-18 MED ORDER — ACETAMINOPHEN 10 MG/ML IV SOLN
INTRAVENOUS | Status: AC
  Filled 2021-09-18: qty 100

## 2021-09-18 MED ORDER — VASOPRESSIN 20 UNIT/ML IV SOLN
INTRAVENOUS | Status: AC
  Filled 2021-09-18: qty 1

## 2021-09-18 SURGICAL SUPPLY — 80 items
ACE SHELL 54 4H HIP (Shell) ×2 IMPLANT
BAG COUNTER SPONGE SURGICOUNT (BAG) IMPLANT
BAG DECANTER FOR FLEXI CONT (MISCELLANEOUS) ×2 IMPLANT
BAG ZIPLOCK 12X15 (MISCELLANEOUS) ×2 IMPLANT
BEARING CROSSLINK RSA 28X44 (Joint) ×1 IMPLANT
BLADE SAGITTAL 25.0X1.37X90 (BLADE) IMPLANT
BLADE SAW SGTL 18X1.27X75 (BLADE) IMPLANT
BNDG COHESIVE 6X5 TAN ST LF (GAUZE/BANDAGES/DRESSINGS) ×2 IMPLANT
BRUSH FEMORAL CANAL (MISCELLANEOUS) IMPLANT
CABLE CERLAGE W/CRIMP 1.8 (Cable) ×4 IMPLANT
CHLORAPREP W/TINT 26 (MISCELLANEOUS) ×2 IMPLANT
COVER SURGICAL LIGHT HANDLE (MISCELLANEOUS) ×2 IMPLANT
DERMABOND ADVANCED (GAUZE/BANDAGES/DRESSINGS) ×2
DERMABOND ADVANCED .7 DNX12 (GAUZE/BANDAGES/DRESSINGS) ×1 IMPLANT
DRAPE HIP W/POCKET STRL (MISCELLANEOUS) ×2 IMPLANT
DRAPE IMP U-DRAPE 54X76 (DRAPES) ×4 IMPLANT
DRAPE INCISE IOBAN 66X45 STRL (DRAPES) ×4 IMPLANT
DRAPE ORTHO SPLIT 77X108 STRL (DRAPES)
DRAPE POUCH INSTRU U-SHP 10X18 (DRAPES) ×2 IMPLANT
DRAPE SHEET LG 3/4 BI-LAMINATE (DRAPES) ×6 IMPLANT
DRAPE STERI IOBAN 125X83 (DRAPES) ×2 IMPLANT
DRAPE SURG 17X11 SM STRL (DRAPES) ×2 IMPLANT
DRAPE SURG ORHT 6 SPLT 77X108 (DRAPES) IMPLANT
DRAPE U-SHAPE 47X51 STRL (DRAPES) ×4 IMPLANT
DRSG AQUACEL AG ADV 3.5X10 (GAUZE/BANDAGES/DRESSINGS) ×2 IMPLANT
DRSG AQUACEL AG ADV 3.5X14 (GAUZE/BANDAGES/DRESSINGS) ×2 IMPLANT
ELECT BLADE TIP CTD 4 INCH (ELECTRODE) ×2 IMPLANT
ELECT REM PT RETURN 15FT ADLT (MISCELLANEOUS) ×2 IMPLANT
FEMORAL MOD ARCOS 60MM SZ A (Joint) ×1 IMPLANT
GAUZE SPONGE 2X2 8PLY STRL LF (GAUZE/BANDAGES/DRESSINGS) IMPLANT
GLOVE SRG 8 PF TXTR STRL LF DI (GLOVE) ×1 IMPLANT
GLOVE SURG ENC MOIS LTX SZ8.5 (GLOVE) ×4 IMPLANT
GLOVE SURG ENC TEXT LTX SZ7.5 (GLOVE) ×4 IMPLANT
GLOVE SURG UNDER POLY LF SZ8 (GLOVE) ×2
GLOVE SURG UNDER POLY LF SZ8.5 (GLOVE) ×2 IMPLANT
GOWN SPEC L3 XXLG W/TWL (GOWN DISPOSABLE) ×2 IMPLANT
GOWN STRL REUS W/ TWL LRG LVL3 (GOWN DISPOSABLE) ×1 IMPLANT
GOWN STRL REUS W/TWL LRG LVL3 (GOWN DISPOSABLE) ×2
HANDPIECE INTERPULSE COAX TIP (DISPOSABLE) ×2
HEAD MODULAR STD 28MM (Orthopedic Implant) ×1 IMPLANT
HOOD PEEL AWAY FLYTE STAYCOOL (MISCELLANEOUS) ×8 IMPLANT
JET LAVAGE IRRISEPT WOUND (IRRIGATION / IRRIGATOR) ×2
KIT BASIN OR (CUSTOM PROCEDURE TRAY) ×2 IMPLANT
KIT TURNOVER KIT A (KITS) IMPLANT
LAVAGE JET IRRISEPT WOUND (IRRIGATION / IRRIGATOR) IMPLANT
LINER ACETAB G7 44MM SZF (Liner) ×1 IMPLANT
MANIFOLD NEPTUNE II (INSTRUMENTS) ×2 IMPLANT
NDL SAFETY ECLIPSE 18X1.5 (NEEDLE) ×1 IMPLANT
NDL SPNL 18GX3.5 QUINCKE PK (NEEDLE) ×1 IMPLANT
NEEDLE HYPO 18GX1.5 SHARP (NEEDLE) ×2
NEEDLE SPNL 18GX3.5 QUINCKE PK (NEEDLE) ×2 IMPLANT
NS IRRIG 1000ML POUR BTL (IV SOLUTION) ×4 IMPLANT
PACK TOTAL JOINT (CUSTOM PROCEDURE TRAY) ×2 IMPLANT
PRESSURIZER FEMORAL UNIV (MISCELLANEOUS) IMPLANT
PROTECTOR NERVE ULNAR (MISCELLANEOUS) ×2 IMPLANT
SEALER BIPOLAR AQUA 6.0 (INSTRUMENTS) ×2 IMPLANT
SET HNDPC FAN SPRY TIP SCT (DISPOSABLE) ×1 IMPLANT
SHELL ACETAB 54 4H HIP (Shell) IMPLANT
SPONGE GAUZE 2X2 STER 10/PKG (GAUZE/BANDAGES/DRESSINGS)
SPONGE T-LAP 18X18 ~~LOC~~+RFID (SPONGE) ×8 IMPLANT
STAPLER VISISTAT 35W (STAPLE) ×2 IMPLANT
STEM DISTAL W/LOCK SCRW 15X150 (Stem) ×1 IMPLANT
STOCKINETTE 8 INCH (MISCELLANEOUS) ×2 IMPLANT
SUCTION FRAZIER HANDLE 12FR (TUBING) ×2
SUCTION TUBE FRAZIER 12FR DISP (TUBING) ×1 IMPLANT
SUT ETHIBOND NAB CT1 #1 30IN (SUTURE) ×2 IMPLANT
SUT MNCRL AB 3-0 PS2 18 (SUTURE) IMPLANT
SUT MON AB 2-0 CT1 36 (SUTURE) ×4 IMPLANT
SUT STRATAFIX PDO 1 14 VIOLET (SUTURE) ×2
SUT STRATFX PDO 1 14 VIOLET (SUTURE) ×1
SUT VIC AB 1 CT1 36 (SUTURE) ×4 IMPLANT
SUT VIC AB 1 CTX 36 (SUTURE) ×4
SUT VIC AB 1 CTX36XBRD ANBCTR (SUTURE) ×1 IMPLANT
SUT VIC AB 2-0 CT1 27 (SUTURE) ×2
SUT VIC AB 2-0 CT1 TAPERPNT 27 (SUTURE) ×1 IMPLANT
SUTURE STRATFX PDO 1 14 VIOLET (SUTURE) ×2 IMPLANT
TOWEL OR 17X26 10 PK STRL BLUE (TOWEL DISPOSABLE) ×4 IMPLANT
TOWER CARTRIDGE SMART MIX (DISPOSABLE) IMPLANT
TRAY FOLEY MTR SLVR 16FR STAT (SET/KITS/TRAYS/PACK) ×2 IMPLANT
WATER STERILE IRR 1000ML POUR (IV SOLUTION) ×2 IMPLANT

## 2021-09-18 NOTE — ED Notes (Signed)
EMTALA reviewed by this Charge RN and all required documents are up to date and pt is ready for transfer. ?

## 2021-09-18 NOTE — H&P (Signed)
?History and Physical  ? ? ?Patient: [name] [name] [id number] DOB: 04-27-1946 ?DOA: 09/18/2021 ?DOS: the patient was seen and examined on 09/18/2021 ?PCP: Perrin Maltese, MD  ?Patient coming from: Home ? ?Chief Complaint:  ?Chief Complaint  ?Patient presents with  ? Fall  ? ? ?HPI: [name] [name] is a 76 y.o. female with medical history significant of autoimmune hepatitis, HTN, CKD3a, HTN. Presenting with right hip pain after a fall. She reports that she recently had a right hip repair. She was in rehab until 2 days ago. When she got home she was able to mobilize well with her cane. Yesterday morning, she lost her balance while walking from her kitchen to the hallway. She fell to her right side. There was no head injury or LOC. She was unable to get up d/t pain in the right hip. She called for EMS. She denies any other aggravating or alleviating factors.   ? ?Review of Systems: As mentioned in the history of present illness. All other systems reviewed and are negative. ?Past Medical History:  ?Diagnosis Date  ? Abnormal LFTs (liver function tests)   ? Autoimmune hepatitis ([place]) 2018  ? Autoimmune hepatitis ([place])   ? Autoimmune hepatitis treated with steroids ([place])   ? DOE (dyspnea on exertion)   ? GERD (gastroesophageal reflux disease)   ? Hepatitis   ? Hyperlipidemia   ? Hypertension   ? Iron deficiency anemia due to chronic blood loss 05/27/2021  ? Osteoporosis   ? ?Past Surgical History:  ?Procedure Laterality Date  ? ABDOMINAL HYSTERECTOMY    ? COLONOSCOPY  10/06/2015  ? COLONOSCOPY WITH PROPOFOL N/A 08/27/2018  ? Procedure: COLONOSCOPY WITH PROPOFOL;  Surgeon: Manya Silvas, MD;  Location: [facility] ENDOSCOPY;  Service: Endoscopy;  Laterality: N/A;  ? ESOPHAGOGASTRODUODENOSCOPY (EGD) WITH PROPOFOL N/A 08/27/2018  ? Procedure: ESOPHAGOGASTRODUODENOSCOPY (EGD) WITH PROPOFOL;  Surgeon: Manya Silvas, MD;  Location: [facility] ENDOSCOPY;  Service: Endoscopy;  Laterality: N/A;  ? ESOPHAGOGASTRODUODENOSCOPY (EGD) WITH  PROPOFOL N/A 05/21/2021  ? Procedure: ESOPHAGOGASTRODUODENOSCOPY (EGD) WITH PROPOFOL;  Surgeon: Lesly Rubenstein, MD;  Location: [facility] ENDOSCOPY;  Service: Endoscopy;  Laterality: N/A;  ? HIP ARTHROPLASTY Right 08/20/2021  ? Procedure: ARTHROPLASTY BIPOLAR HIP (HEMIARTHROPLASTY);  Surgeon: Thornton Park, MD;  Location: [facility] ORS;  Service: Orthopedics;  Laterality: Right;  ? LIVER BIOPSY    ? ?Social History:  reports that she has never smoked. She has never used smokeless tobacco. She reports that she does not drink alcohol and does not use drugs. ? ?Allergies  ?Allergen Reactions  ? Sulfa Antibiotics Nausea And Vomiting  ?  Pt has not had a sulfa drug in years and is not certain of reaction but suspects nausea and vomiting.  ? Sulfasalazine Nausea And Vomiting  ? ? ?Family History  ?Problem Relation Age of Onset  ? Cancer Mother   ? ? ?Prior to Admission medications   ?Medication Sig Start Date End Date Taking? Authorizing Provider  ?acetaminophen (TYLENOL) 325 MG tablet Take 2 tablets (650 mg total) by mouth every 6 (six) hours as needed for mild pain or moderate pain (pain score 1-3 or temp > 100.5). 08/24/21   Loletha Grayer, MD  ?aspirin EC 81 MG tablet Take 81 mg by mouth daily.     [provider]  ?azaTHIOprine (IMURAN) 50 MG tablet Take 25 mg by mouth daily. 09/22/17   [provider]  ?Calcium Carbonate Antacid 600 MG chewable tablet Chew 600 mg by mouth.    [provider]  ?calcium citrate-vitamin D (CITRACAL+D) 315-200 MG-UNIT tablet Take 1 tablet by mouth 2 (two) times daily.    [provider]  ?cyanocobalamin 100 MCG tablet Take 100 mcg by mouth daily.    [provider]  ?enoxaparin (LOVENOX) 30 MG/0.3ML injection Inject 0.3 mLs (30 mg total) into the skin daily for 14 days. 08/24/21 09/07/21  Loletha Grayer, MD  ?estradiol (ESTRACE) 0.1 MG/GM vaginal cream Insert pea size amount daily x 2 weeks then every other day x 2 weeks then 2-3 times weekly for  maintenance 02/20/19   [provider]  ?feeding supplement (ENSURE ENLIVE / ENSURE PLUS) LIQD Take 237 mLs by mouth 2 (two) times daily between meals. 08/24/21   Loletha Grayer, MD  ?Ferrous Fumarate (HEMOCYTE - 106 MG FE) 324 (106 Fe) MG TABS tablet Take by mouth.    [provider]  ?HYDROcodone-acetaminophen (NORCO/VICODIN) 5-325 MG tablet Take 1 tablet by mouth every 6 (six) hours as needed for severe pain. 08/24/21   Loletha Grayer, MD  ?metoprolol tartrate (LOPRESSOR) 50 MG tablet Take 50 mg by mouth daily.    [provider]  ?Misc. Devices (WALKER) MISC 1 Device by Does not apply route as directed. 10/05/17   Arta Silence, MD  ?Multiple Vitamin (MULTIVITAMIN WITH MINERALS) TABS tablet Take 1 tablet by mouth daily. 08/24/21   Loletha Grayer, MD  ?omeprazole (PRILOSEC) 40 MG capsule Take 40 mg by mouth daily. 08/11/20   [provider]  ?polyethylene glycol (MIRALAX / GLYCOLAX) 17 g packet Take 17 g by mouth daily as needed for mild constipation. 08/24/21   Loletha Grayer, MD  ?potassium chloride (KLOR-CON) 10 MEQ tablet Take 10 mEq by mouth daily. 10/03/19 09/18/21  [provider]  ?predniSONE (DELTASONE) 5 MG tablet Take 7.5 mg by mouth daily. 09/17/17   [provider]  ? ? ?Physical Exam: ?Vitals:  ? 09/18/21 1125  ?BP: 137/86  ?Pulse: 92  ?Resp: 14  ?Temp: 98.1 ?F (36.7 ?C)  ?TempSrc: Oral  ?SpO2: 93%  ? ?General: 76 y.o. female resting in bed in NAD ?Eyes: PERRL, normal sclera ?ENMT: Nares patent w/o discharge, orophaynx clear, dentition normal, ears w/o discharge/lesions/ulcers ?Neck: Supple, trachea midline ?Cardiovascular: tachy, +S1, S2, no m/g/r, equal pulses throughout ?Respiratory: CTABL, no w/r/r, normal WOB ?GI: BS+, NDNT, no masses noted, no organomegaly noted ?MSK: No e/c/c; limited ROM of right hip d/t pain ?Neuro: A&O x 3, no focal deficits ?Psyc: Appropriate interaction and affect, calm/cooperative ? ?Data Reviewed: ? ?Scr 1.27 ?Hgb  10.7 ? ?CT right hip: 1. Displaced oblique periprosthetic fracture of the right proximal femur including the greater trochanter and extending into the proximal femoral metadiaphysis measuring approximately 9.4 cm in craniocaudal dimension and approximately 1 cm lateral displacement. 2. Femoral component is located within the acetabulum. No other appreciable fracture. ? ?Assessment and Plan: ?No notes have been filed under this hospital service. ?Service: Hospitalist ?Periprosthetic intertrochanteric fracture of the right femur ?    - admit to inpt, med-surg ?    - CT as above ?    - ortho to take to OR today; appreciate assistance.  ?    - PT/OT eval in AM ?    - TOC consult ?    - pain control ? ?Normocytic anemia ?   - baseline Hgb is 10 - 12; she is at baseline, follow ? ?HTN ?    - continue home regimen when confirmed ? ?CKD3a ?    - baseline Scr  is 1.1 - 1.4; she is at baseline, follow ? ?Autoimmune hepatitis ?    - continue home regimen when confirmed ? ?Advance Care Planning:   Code Status: FULL ? ?Consults: Orthopedics (Dr. Lillia Corporal) ? ?Family Communication: None at bedside ? ?Severity of Illness: ?The appropriate patient status for this patient is INPATIENT. Inpatient status is judged to be reasonable and necessary in order to provide the required intensity of service to ensure the patient's safety. The patient's presenting symptoms, physical exam findings, and initial radiographic and laboratory data in the context of their chronic comorbidities is felt to place them at high risk for further clinical deterioration. Furthermore, it is not anticipated that the patient will be medically stable for discharge from the hospital within 2 midnights of admission.  ? ?* I certify that at the point of admission it is my clinical judgment that the patient will require inpatient hospital care spanning beyond 2 midnights from the point of admission due to high intensity of service, high risk for further deterioration  and high frequency of surveillance required.* ? ?Author: ?Jonnie Finner, DO ?09/18/2021 11:58 AM ? ?For on call review [email or web address].  ?

## 2021-09-18 NOTE — Anesthesia Procedure Notes (Addendum)
Procedure Name: Intubation ?Date/Time: 09/18/2021 3:17 PM ?Performed by: Gean Maidens, CRNA ?Pre-anesthesia Checklist: Patient identified, Emergency Drugs available, Suction available, Patient being monitored and Timeout performed ?Patient Re-evaluated:Patient Re-evaluated prior to induction ?Oxygen Delivery Method: Circle system utilized ?Preoxygenation: Pre-oxygenation with 100% oxygen ?Induction Type: IV induction ?Ventilation: Mask ventilation without difficulty ?Laryngoscope Size: Mac and 3 ?Grade View: Grade I ?Tube type: Oral ?Tube size: 7.5 mm ?Number of attempts: 1 ?Airway Equipment and Method: Stylet ?Placement Confirmation: ETT inserted through vocal cords under direct vision, positive ETCO2 and breath sounds checked- equal and bilateral ?Secured at: 21 cm ?Tube secured with: Tape ?Dental Injury: Teeth and Oropharynx as per pre-operative assessment  ? ? ? ? ?

## 2021-09-18 NOTE — ED Notes (Signed)
Pt famliy member Mitzi Hansen called and updated on pt status and that pt was being brought to PACU  ?

## 2021-09-18 NOTE — Consult Note (Signed)
ORTHOPAEDIC CONSULTATION  REQUESTING PHYSICIAN: Teddy SpikeKyle, Tyrone A, DO  PCP:  Margaretann LovelessKhan, Neelam S, MD  Chief Complaint: Right periprosthetic femur fracture  HPI: [name] [name] is a 76 y.o. female who presented to the emergency department at [facility] after a fall onto the right hip.  She had right hip pain and inability to weight-bear.  She originally underwent right hip hemiarthroplasty, posterior approach, by Dr. Martha ClanKrasinski on 08/20/2021.  X-rays and CT scan of the right hip revealed a [place] B2 periprosthetic femur fracture with subsided femoral component.  She already has complete cartilage loss superiorly in the acetabulum.  Orthopedic consultation was placed by the emergency department.  She was seen by [facility] for perioperative restratification and medical optimization.  [facility].  She denies other injury.  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  She normally walks over 30 minutes/day for exercise purposes.  Past Medical History:  Diagnosis Date   Abnormal LFTs (liver function tests)    Autoimmune hepatitis ([facility]) 2018   Autoimmune hepatitis ([facility])    Autoimmune hepatitis treated with steroids ([facility])    DOE (dyspnea on exertion)    GERD (gastroesophageal reflux disease)    Hepatitis    Hyperlipidemia    Hypertension    Iron deficiency anemia due to chronic blood loss 05/27/2021   Osteoporosis    Past Surgical History:  Procedure Laterality Date   ABDOMINAL HYSTERECTOMY     COLONOSCOPY  10/06/2015   COLONOSCOPY WITH PROPOFOL N/A 08/27/2018   Procedure: COLONOSCOPY WITH PROPOFOL;  Surgeon: Scot JunElliott, Robert T, MD;  [facility] ENDOSCOPY;  Service: Endoscopy;  Laterality: N/A;   ESOPHAGOGASTRODUODENOSCOPY (EGD) WITH PROPOFOL N/A 08/27/2018   Procedure: ESOPHAGOGASTRODUODENOSCOPY (EGD) WITH PROPOFOL;  Surgeon: Scot JunElliott, Robert T, MD;  [facility];  Service: Endoscopy;  Laterality: N/A;    ESOPHAGOGASTRODUODENOSCOPY (EGD) WITH PROPOFOL N/A 05/21/2021   Procedure: ESOPHAGOGASTRODUODENOSCOPY (EGD) WITH PROPOFOL;  Surgeon: Regis BillLocklear, Cameron T, MD;  Location: [facility] ENDOSCOPY;  Service: Endoscopy;  Laterality: N/A;   HIP ARTHROPLASTY Right 08/20/2021   Procedure: ARTHROPLASTY BIPOLAR HIP (HEMIARTHROPLASTY);  Surgeon: Juanell FairlyKrasinski, Kevin, MD;  Location: [facility] ORS;  Service: Orthopedics;  Laterality: Right;   LIVER BIOPSY     Social History   Socioeconomic History   Marital status: Married    Spouse name: Not on file   Number of children: Not on file   Years of education: Not on file   Highest education level: Not on file  Occupational History   Not on file  Tobacco Use   Smoking status: Never   Smokeless tobacco: Never  Vaping Use   Vaping Use: Never used  Substance and Sexual Activity   Alcohol use: No   Drug use: Never   Sexual activity: Not Currently    Birth control/protection: Post-menopausal  Other Topics Concern   Not on file  Social History Narrative   Not on file   Social Determinants of Health   Financial Resource Strain: Not on file  Food Insecurity: Not on file  Transportation Needs: Not on file  Physical Activity: Not on file  Stress: Not on file  Social Connections: Not on file   Family History  Problem Relation Age of Onset   Cancer Mother    Allergies  Allergen Reactions   Sulfa Antibiotics Nausea And Vomiting    Pt has not had a sulfa drug in years and is not certain of reaction but suspects nausea and vomiting.  Sulfasalazine Nausea And Vomiting   Prior to Admission medications   Medication Sig Start Date End Date Taking? Authorizing Provider  acetaminophen (TYLENOL) 325 MG tablet Take 2 tablets (650 mg total) by mouth every 6 (six) hours as needed for mild pain or moderate pain (pain score 1-3 or temp > 100.5). 08/24/21  Yes Wieting, Richard, MD  aspirin EC 81 MG tablet Take 81 mg by mouth daily.    Yes [provider]  azaTHIOprine  (IMURAN) 50 MG tablet Take 25 mg by mouth daily. 09/22/17  Yes [provider]  Calcium Carbonate Antacid 600 MG chewable tablet Chew 600 mg by mouth.   Yes [provider]  calcium citrate-vitamin D (CITRACAL+D) 315-200 MG-UNIT tablet Take 1 tablet by mouth 2 (two) times daily.   Yes [provider]  cyanocobalamin 100 MCG tablet Take 100 mcg by mouth daily.   Yes [provider]  enoxaparin (LOVENOX) 30 MG/0.3ML injection Inject 0.3 mLs (30 mg total) into the skin daily for 14 days. 08/24/21 09/18/21 Yes Wieting, Richard, MD  estradiol (ESTRACE) 0.1 MG/GM vaginal cream Place 1 Applicatorful vaginally 3 (three) times a week. 02/20/19  Yes [provider]  feeding supplement (ENSURE ENLIVE / ENSURE PLUS) LIQD Take 237 mLs by mouth 2 (two) times daily between meals. 08/24/21  Yes Wieting, Richard, MD  Ferrous Fumarate (HEMOCYTE - 106 MG FE) 324 (106 Fe) MG TABS tablet Take 1 tablet by mouth daily.   Yes [provider]  metoprolol tartrate (LOPRESSOR) 50 MG tablet Take 50 mg by mouth daily.   Yes [provider]  Multiple Vitamin (MULTIVITAMIN WITH MINERALS) TABS tablet Take 1 tablet by mouth daily. 08/24/21  Yes Wieting, Richard, MD  omeprazole (PRILOSEC) 40 MG capsule Take 40 mg by mouth daily. 08/11/20  Yes [provider]  polyethylene glycol (MIRALAX / GLYCOLAX) 17 g packet Take 17 g by mouth daily as needed for mild constipation. 08/24/21  Yes Wieting, Richard, MD  potassium chloride (KLOR-CON) 10 MEQ tablet Take 10 mEq by mouth daily. 10/03/19 09/18/21 Yes [provider]  predniSONE (DELTASONE) 5 MG tablet Take 7.5 mg by mouth daily. 09/17/17  Yes [provider]  sulfamethoxazole-trimethoprim (BACTRIM DS) 800-160 MG tablet Take 1 tablet by mouth daily. 09/10/21  Yes [provider]  HYDROcodone-acetaminophen (NORCO/VICODIN) 5-325 MG tablet Take 1 tablet by mouth every 6 (six) hours as needed for severe pain. Patient  not taking: Reported on 09/18/2021 08/24/21   Alford Highland, MD  Misc. Devices (WALKER) MISC 1 Device by Does not apply route as directed. 10/05/17   Dionne Bucy, MD   DG Chest 1 View  Result Date: 09/17/2021 CLINICAL DATA:  s/p fall onto R side. C/o R thigh pain with movement and palpation. Prefers lying on L side. No obvious shortening or rotation. D/c'd from rehab yesterday s/p R hip surgery from fall 1 month ago. HTN EXAM: CHEST  1 VIEW COMPARISON:  08/19/2021 FINDINGS: Cardiac silhouette mildly enlarged. Moderate to large hiatal hernia, stable. No mediastinal or hilar masses. Mild atelectasis adjacent to the hiatal hernia. Lungs otherwise clear. No convincing pleural effusion and no pneumothorax. Skeletal structures are demineralized but grossly intact. IMPRESSION: No acute cardiopulmonary disease. Electronically Signed   By: Amie Portland M.D.   On: 09/17/2021 13:10   DG Pelvis 1-2 Views  Result Date: 09/17/2021 CLINICAL DATA:  Status post fall, right thigh pain EXAM: PELVIS - 1-2 VIEW; RIGHT FEMUR 2 VIEWS COMPARISON:  None. FINDINGS: Generalized osteopenia. Left  hip arthroplasty. Oblique periprosthetic fracture of the left intertrochanteric region of femur extending into the proximal diaphysis with 7 mm of medial displacement. No other fracture or dislocation. Mild medial and lateral femorotibial compartment joint space narrowing. Peripheral vascular atherosclerotic disease. IMPRESSION: 1. Oblique periprosthetic fracture of the left intertrochanteric region of femur extending into the proximal diaphysis with 7 mm of medial displacement. Electronically Signed   By: Elige Ko M.D.   On: 09/17/2021 13:11   CT HIP RIGHT WO CONTRAST  Result Date: 09/18/2021 CLINICAL DATA:  Right femoral fracture, pre-surgical planning EXAM: CT OF THE RIGHT HIP WITHOUT CONTRAST TECHNIQUE: Multidetector CT imaging of the right hip was performed according to the standard protocol. Multiplanar CT image  reconstructions were also generated. RADIATION DOSE REDUCTION: This exam was performed according to the departmental dose-optimization program which includes automated exposure control, adjustment of the mA and/or kV according to patient size and/or use of iterative reconstruction technique. COMPARISON:  None. FINDINGS: Bones/Joint/Cartilage Status post right hip arthroplasty. There is a oblique displaced periprosthetic fracture of the right proximal femur including the greater trochanter and extending into the proximal femoral metadiaphysis along the lateral aspect of the right femur. There is approximately 1 cm lateral displacement of the major fracture fragment which measures at least 9.4 cm in craniocaudal dimension. The femoral component is located within the acetabulum. There is no other appreciable fracture. Ligaments Suboptimally assessed by CT. Muscles and Tendons Generalized muscle atrophy.  No intramuscular hematoma. Soft tissues Hematoma/seroma about the lateral aspect of the greater trochanter measuring approximately 5.8 x 3.5 x 13.0 cm IMPRESSION: 1. Displaced oblique periprosthetic fracture of the right proximal femur including the greater trochanter and extending into the proximal femoral metadiaphysis measuring approximately 9.4 cm in craniocaudal dimension and approximately 1 cm lateral displacement. 2. Femoral component is located within the acetabulum. No other appreciable fracture. 3. Large subcutaneous hematoma/seroma measuring 5.8 x 3.5 x 13.0 cm. Electronically Signed   By: Larose Hires D.O.   On: 09/18/2021 10:29   DG Femur Min 2 Views Right  Result Date: 09/17/2021 CLINICAL DATA:  Status post fall, right thigh pain EXAM: PELVIS - 1-2 VIEW; RIGHT FEMUR 2 VIEWS COMPARISON:  None. FINDINGS: Generalized osteopenia. Left hip arthroplasty. Oblique periprosthetic fracture of the left intertrochanteric region of femur extending into the proximal diaphysis with 7 mm of medial displacement. No  other fracture or dislocation. Mild medial and lateral femorotibial compartment joint space narrowing. Peripheral vascular atherosclerotic disease. IMPRESSION: 1. Oblique periprosthetic fracture of the left intertrochanteric region of femur extending into the proximal diaphysis with 7 mm of medial displacement. Electronically Signed   By: Elige Ko M.D.   On: 09/17/2021 13:11    Positive ROS: All other systems have been reviewed and were otherwise negative with the exception of those mentioned in the HPI and as above.  Physical Exam: General: Alert, no acute distress Cardiovascular: No pedal edema Respiratory: No cyanosis, no use of accessory musculature GI: No organomegaly, abdomen is soft and non-tender Skin: No lesions in the area of chief complaint Neurologic: Sensation intact distally Psychiatric: Patient is competent for consent with normal mood and affect Lymphatic: No axillary or cervical lymphadenopathy  MUSCULOSKELETAL: Examination of the right hip reveals a healed posterior incision.  She is shortened and rotated.  There is no focal motor or sensory deficit distally.  She has palpable pedal pulses.  Assessment: Right [place] B2 periprosthetic femur fracture. Full-thickness cartilage loss right acetabulum.  Plan: I discussed the findings with the  patient.  She has an unstable right femur fracture with subsided, loose implant.  She requires revision of her femoral component.  I also recommend conversion to total hip arthroplasty, and she is in agreement.  We discussed the risk, benefits, and alternatives.  Please see statement of risk.  Plan for surgery today.  Continue NPO.  Hold chemical DVT prophylaxis.  The risks, benefits, and alternatives were discussed with the patient. There are risks associated with the surgery including, but not limited to, problems with anesthesia (death), infection, differences in leg length/angulation/rotation, fracture of bones, loosening or failure  of implants, malunion, nonunion, differences in leg lengths/angulation/rotation, instability (dislocation), hematoma (blood accumulation) which may require surgical drainage, blood clots, pulmonary embolism, nerve injury (foot drop), and blood vessel injury. The patient understands these risks and elects to proceed.   Jonette Pesa, MD [phone]    09/18/2021 2:47 PM

## 2021-09-18 NOTE — ED Notes (Signed)
MD at bedside. 

## 2021-09-18 NOTE — Anesthesia Preprocedure Evaluation (Signed)
Anesthesia Evaluation  ?Patient identified by MRN, date of birth, ID band ?Patient awake ? ? ? ?Reviewed: ?Allergy & Precautions, NPO status , Patient's Chart, lab work & pertinent test results ? ?History of Anesthesia Complications ?Negative for: history of anesthetic complications ? ?Airway ?Mallampati: II ? ?TM Distance: >3 FB ?Neck ROM: Full ? ? ? Dental ? ?(+) Dental Advisory Given, Poor Dentition, Chipped,  ?  ?Pulmonary ?neg pulmonary ROS,  ?  ?breath sounds clear to auscultation ? ? ? ? ? ? Cardiovascular ?hypertension, Pt. on medications and Pt. on home beta blockers ? ?Rhythm:Regular  ? ?  ?Neuro/Psych ?negative neurological ROS ? negative psych ROS  ? GI/Hepatic ?PUD, GERD  ,(+) Hepatitis -  ?Endo/Other  ? ? Renal/GU ?CRFRenal diseaseLab Results ?     Component                Value               Date                 ?     CREATININE               1.27 (H)            09/17/2021           ?Lab Results ?     Component                Value               Date                 ?     K                        4.1                 09/17/2021           ?  ? ?  ?Musculoskeletal ?PERIPROSTHETIC RIGHT FEMUR FRACTURE  ? Abdominal ?  ?Peds ? Hematology ? ?(+) Blood dyscrasia, anemia , Lab Results ?     Component                Value               Date                 ?     WBC                      6.7                 09/17/2021           ?     HGB                      10.7 (L)            09/17/2021           ?     HCT                      35.9 (L)            09/17/2021           ?     MCV  94.2                09/17/2021           ?     PLT                      228                 09/17/2021           ?   ?Anesthesia Other Findings ? ? Reproductive/Obstetrics ? ?  ? ? ? ? ? ? ? ? ? ? ? ? ? ?  ?  ? ? ? ? ? ? ? ? ?Anesthesia Physical ?Anesthesia Plan ? ?ASA: 2 ? ?Anesthesia Plan: General  ? ?Post-op Pain Management: Ofirmev IV (intra-op)*  ? ?Induction:  Intravenous ? ?PONV Risk Score and Plan: 3 and Ondansetron and Dexamethasone ? ?Airway Management Planned: Oral ETT ? ?Additional Equipment: None ? ?Intra-op Plan:  ? ?Post-operative Plan: Extubation in OR ? ?Informed Consent: I have reviewed the patients History and Physical, chart, labs and discussed the procedure including the risks, benefits and alternatives for the proposed anesthesia with the patient or authorized representative who has indicated his/her understanding and acceptance.  ? ? ? ?Dental advisory given ? ?Plan Discussed with: CRNA and Anesthesiologist ? ?Anesthesia Plan Comments:   ? ? ? ? ? ? ?Anesthesia Quick Evaluation ? ?

## 2021-09-18 NOTE — ED Triage Notes (Signed)
Pt arrived from [facility] by [facility]. Pt states pain is tolerable at this time. She is complaining of nausea. Pt is alert and oriented.  ?

## 2021-09-18 NOTE — H&P (Signed)
History and Physical    Nathalee Hardie ShackletonS Mangieri WUJ:[id number] DOB: 15-Nov-1945 DOA: 09/17/2021  PCP: Margaretann LovelessKhan, Neelam S, MD  Patient coming from: home   Chief Complaint: fall  HPI: [name] [name] is a 76 y.o. female with medical history significant for anemia, htn, autoimmune hepatitis, ckd 3a, who presents with the above.  Mechanical fall at home yesterday. Denies pain currently. No head trauma or LOC. Presented to our ED yesterday. Was hospitalized last month for right hip fracture that was repaired. Also had urinary retention that has resolved. In the ED imaging shows right periprosthetic fracture. She reports no chest pain, no dysuria, able to void. No numbness in extremities.    Review of Systems: As per HPI otherwise 10 point review of systems negative.    Past Medical History:  Diagnosis Date   Abnormal LFTs (liver function tests)    Autoimmune hepatitis (HCC) 2018   Autoimmune hepatitis (HCC)    Autoimmune hepatitis treated with steroids (HCC)    DOE (dyspnea on exertion)    GERD (gastroesophageal reflux disease)    Hepatitis    Hyperlipidemia    Hypertension    Iron deficiency anemia due to chronic blood loss 05/27/2021   Osteoporosis     Past Surgical History:  Procedure Laterality Date   ABDOMINAL HYSTERECTOMY     COLONOSCOPY  10/06/2015   COLONOSCOPY WITH PROPOFOL N/A 08/27/2018   Procedure: COLONOSCOPY WITH PROPOFOL;  Surgeon: Scot JunElliott, Robert T, MD;  [facility];  Service: Endoscopy;  Laterality: N/A;   ESOPHAGOGASTRODUODENOSCOPY (EGD) WITH PROPOFOL N/A 08/27/2018   Procedure: ESOPHAGOGASTRODUODENOSCOPY (EGD) WITH PROPOFOL;  Surgeon: Scot JunElliott, Robert T, MD;  [facility];  Service: Endoscopy;  Laterality: N/A;   ESOPHAGOGASTRODUODENOSCOPY (EGD) WITH PROPOFOL N/A 05/21/2021   Procedure: ESOPHAGOGASTRODUODENOSCOPY (EGD) WITH PROPOFOL;  Surgeon: Regis BillLocklear, Cameron T, MD;  Location: [facility] ENDOSCOPY;  Service: Endoscopy;  Laterality: N/A;   HIP ARTHROPLASTY  Right 08/20/2021   Procedure: ARTHROPLASTY BIPOLAR HIP (HEMIARTHROPLASTY);  Surgeon: Juanell FairlyKrasinski, Kevin, MD;  Location: [facility] ORS;  Service: Orthopedics;  Laterality: Right;   LIVER BIOPSY       reports that she has never smoked. She has never used smokeless tobacco. She reports that she does not drink alcohol and does not use drugs.  Allergies  Allergen Reactions   Sulfa Antibiotics Nausea And Vomiting    Pt has not had a sulfa drug in years and is not certain of reaction but suspects nausea and vomiting.   Sulfasalazine Nausea And Vomiting    Family History  Problem Relation Age of Onset   Cancer Mother     Prior to Admission medications   Medication Sig Start Date End Date Taking? Authorizing Provider  acetaminophen (TYLENOL) 325 MG tablet Take 2 tablets (650 mg total) by mouth every 6 (six) hours as needed for mild pain or moderate pain (pain score 1-3 or temp > 100.5). 08/24/21   Alford HighlandWieting, Richard, MD  aspirin EC 81 MG tablet Take 81 mg by mouth daily.     [provider]  azaTHIOprine (IMURAN) 50 MG tablet Take 25 mg by mouth daily. 09/22/17   [provider]  Calcium Carbonate Antacid 600 MG chewable tablet Chew 600 mg by mouth.    [provider]  calcium citrate-vitamin D (CITRACAL+D) 315-200 MG-UNIT tablet Take 1 tablet by mouth 2 (two) times daily.    [provider]  cyanocobalamin 100 MCG tablet Take 100 mcg by mouth daily.    [provider]  enoxaparin (LOVENOX) 30  MG/0.3ML injection Inject 0.3 mLs (30 mg total) into the skin daily for 14 days. 08/24/21 09/07/21  Alford Highland, MD  estradiol (ESTRACE) 0.1 MG/GM vaginal cream Insert pea size amount daily x 2 weeks then every other day x 2 weeks then 2-3 times weekly for maintenance 02/20/19   [provider]  feeding supplement (ENSURE ENLIVE / ENSURE PLUS) LIQD Take 237 mLs by mouth 2 (two) times daily between meals. 08/24/21   Alford Highland, MD  Ferrous Fumarate (HEMOCYTE - 106  MG FE) 324 (106 Fe) MG TABS tablet Take by mouth.    [provider]  HYDROcodone-acetaminophen (NORCO/VICODIN) 5-325 MG tablet Take 1 tablet by mouth every 6 (six) hours as needed for severe pain. 08/24/21   Alford Highland, MD  metoprolol tartrate (LOPRESSOR) 50 MG tablet Take 50 mg by mouth daily.    [provider]  Misc. Devices (WALKER) MISC 1 Device by Does not apply route as directed. 10/05/17   Dionne Bucy, MD  Multiple Vitamin (MULTIVITAMIN WITH MINERALS) TABS tablet Take 1 tablet by mouth daily. 08/24/21   Alford Highland, MD  omeprazole (PRILOSEC) 40 MG capsule Take 40 mg by mouth daily. 08/11/20   [provider]  polyethylene glycol (MIRALAX / GLYCOLAX) 17 g packet Take 17 g by mouth daily as needed for mild constipation. 08/24/21   Alford Highland, MD  potassium chloride (KLOR-CON) 10 MEQ tablet Take 10 mEq by mouth daily. 10/03/19 09/18/21  [provider]  predniSONE (DELTASONE) 5 MG tablet Take 7.5 mg by mouth daily. 09/17/17   [provider]    Physical Exam: Vitals:   09/18/21 0915 09/18/21 0930 09/18/21 1000 09/18/21 1006  BP:  128/71 124/64 (!) 142/84  Pulse: 87 86 83 81  Resp: 16 15 17 17   Temp:    98.9 F (37.2 C)  TempSrc:      SpO2:   91% 94%  Weight:      Height:        Constitutional: No acute distress Head: Atraumatic Eyes: Conjunctiva clear ENM: Moist mucous membranes. Normal dentition.  Neck: Supple Respiratory: Clear to auscultation bilaterally, no wheezing/rales/rhonchi. Normal respiratory effort. No accessory muscle use. . Cardiovascular: Regular rate and rhythm. No murmurs/rubs/gallops. Abdomen: Non-tender, non-distended. No masses. No rebound or guarding. Positive bowel sounds. Musculoskeletal: pain with right leg log roll Skin: No rashes, lesions, or ulcers. Healing incision right lateral femur Extremities: No peripheral edema. Palpable peripheral pulses. warm Neurologic: Alert, moving all 4  extremities. Psychiatric: Normal insight and judgement.   Labs on Admission: I have personally reviewed following labs and imaging studies  CBC: Recent Labs  Lab 09/17/21 1220  WBC 6.7  NEUTROABS 4.8  HGB 10.7*  HCT 35.9*  MCV 94.2  PLT 228   Basic Metabolic Panel: Recent Labs  Lab 09/17/21 1220  NA 135  K 4.1  CL 107  CO2 23  GLUCOSE 94  BUN 31*  CREATININE 1.27*  CALCIUM 9.2   GFR: Estimated Creatinine Clearance: 33.1 mL/min (A) (by C-G formula based on SCr of 1.27 mg/dL (H)). Liver Function Tests: No results for input(s): AST, ALT, ALKPHOS, BILITOT, PROT, ALBUMIN in the last 168 hours. No results for input(s): LIPASE, AMYLASE in the last 168 hours. No results for input(s): AMMONIA in the last 168 hours. Coagulation Profile: No results for input(s): INR, PROTIME in the last 168 hours. Cardiac Enzymes: No results for input(s): CKTOTAL, CKMB, CKMBINDEX, TROPONINI in the last 168 hours. BNP (last 3 results) No results for  input(s): PROBNP in the last 8760 hours. HbA1C: No results for input(s): HGBA1C in the last 72 hours. CBG: No results for input(s): GLUCAP in the last 168 hours. Lipid Profile: No results for input(s): CHOL, HDL, LDLCALC, TRIG, CHOLHDL, LDLDIRECT in the last 72 hours. Thyroid Function Tests: No results for input(s): TSH, T4TOTAL, FREET4, T3FREE, THYROIDAB in the last 72 hours. Anemia Panel: No results for input(s): VITAMINB12, FOLATE, FERRITIN, TIBC, IRON, RETICCTPCT in the last 72 hours. Urine analysis:    Component Value Date/Time   COLORURINE YELLOW (A) 09/17/2021 1349   APPEARANCEUR CLEAR (A) 09/17/2021 1349   APPEARANCEUR Cloudy (A) 02/27/2019 1102   LABSPEC 1.015 09/17/2021 1349   PHURINE 5.0 09/17/2021 1349   GLUCOSEU NEGATIVE 09/17/2021 1349   HGBUR NEGATIVE 09/17/2021 1349   BILIRUBINUR NEGATIVE 09/17/2021 1349   BILIRUBINUR Negative 02/27/2019 1102   KETONESUR NEGATIVE 09/17/2021 1349   PROTEINUR NEGATIVE 09/17/2021 1349    NITRITE NEGATIVE 09/17/2021 1349   LEUKOCYTESUR NEGATIVE 09/17/2021 1349    Radiological Exams on Admission: DG Chest 1 View  Result Date: 09/17/2021 CLINICAL DATA:  s/p fall onto R side. C/o R thigh pain with movement and palpation. Prefers lying on L side. No obvious shortening or rotation. D/c'd from rehab yesterday s/p R hip surgery from fall 1 month ago. HTN EXAM: CHEST  1 VIEW COMPARISON:  08/19/2021 FINDINGS: Cardiac silhouette mildly enlarged. Moderate to large hiatal hernia, stable. No mediastinal or hilar masses. Mild atelectasis adjacent to the hiatal hernia. Lungs otherwise clear. No convincing pleural effusion and no pneumothorax. Skeletal structures are demineralized but grossly intact. IMPRESSION: No acute cardiopulmonary disease. Electronically Signed   By: Amie Portland M.D.   On: 09/17/2021 13:10   DG Pelvis 1-2 Views  Result Date: 09/17/2021 CLINICAL DATA:  Status post fall, right thigh pain EXAM: PELVIS - 1-2 VIEW; RIGHT FEMUR 2 VIEWS COMPARISON:  None. FINDINGS: Generalized osteopenia. Left hip arthroplasty. Oblique periprosthetic fracture of the left intertrochanteric region of femur extending into the proximal diaphysis with 7 mm of medial displacement. No other fracture or dislocation. Mild medial and lateral femorotibial compartment joint space narrowing. Peripheral vascular atherosclerotic disease. IMPRESSION: 1. Oblique periprosthetic fracture of the left intertrochanteric region of femur extending into the proximal diaphysis with 7 mm of medial displacement. Electronically Signed   By: Elige Ko M.D.   On: 09/17/2021 13:11   CT HIP RIGHT WO CONTRAST  Result Date: 09/18/2021 CLINICAL DATA:  Right femoral fracture, pre-surgical planning EXAM: CT OF THE RIGHT HIP WITHOUT CONTRAST TECHNIQUE: Multidetector CT imaging of the right hip was performed according to the standard protocol. Multiplanar CT image reconstructions were also generated. RADIATION DOSE REDUCTION: This exam was  performed according to the departmental dose-optimization program which includes automated exposure control, adjustment of the mA and/or kV according to patient size and/or use of iterative reconstruction technique. COMPARISON:  None. FINDINGS: Bones/Joint/Cartilage Status post right hip arthroplasty. There is a oblique displaced periprosthetic fracture of the right proximal femur including the greater trochanter and extending into the proximal femoral metadiaphysis along the lateral aspect of the right femur. There is approximately 1 cm lateral displacement of the major fracture fragment which measures at least 9.4 cm in craniocaudal dimension. The femoral component is located within the acetabulum. There is no other appreciable fracture. Ligaments Suboptimally assessed by CT. Muscles and Tendons Generalized muscle atrophy.  No intramuscular hematoma. Soft tissues Hematoma/seroma about the lateral aspect of the greater trochanter measuring approximately 5.8 x 3.5 x 13.0 cm IMPRESSION:  1. Displaced oblique periprosthetic fracture of the right proximal femur including the greater trochanter and extending into the proximal femoral metadiaphysis measuring approximately 9.4 cm in craniocaudal dimension and approximately 1 cm lateral displacement. 2. Femoral component is located within the acetabulum. No other appreciable fracture. 3. Large subcutaneous hematoma/seroma measuring 5.8 x 3.5 x 13.0 cm. Electronically Signed   By: Larose Hires D.O.   On: 09/18/2021 10:29   DG Femur Min 2 Views Right  Result Date: 09/17/2021 CLINICAL DATA:  Status post fall, right thigh pain EXAM: PELVIS - 1-2 VIEW; RIGHT FEMUR 2 VIEWS COMPARISON:  None. FINDINGS: Generalized osteopenia. Left hip arthroplasty. Oblique periprosthetic fracture of the left intertrochanteric region of femur extending into the proximal diaphysis with 7 mm of medial displacement. No other fracture or dislocation. Mild medial and lateral femorotibial compartment  joint space narrowing. Peripheral vascular atherosclerotic disease. IMPRESSION: 1. Oblique periprosthetic fracture of the left intertrochanteric region of femur extending into the proximal diaphysis with 7 mm of medial displacement. Electronically Signed   By: Elige Ko M.D.   On: 09/17/2021 13:11    EKG: Independently reviewed. nsr  Assessment/Plan Principal Problem:   Periprosthetic intertrochanteric fracture of femur   # Right periprosthetic intertrochanteric fracture ED transfer to [place] today for operative repair later today  # Anemia Hgb 10.7 which is stable - monitor  # Autoimmune hepatitis Quiescent - cont home prednisone, imuran  # CKD3a At baseline - monitor  # Recent urinary retention Voiding spontaneously now, recently saw urology - monitor for recurrence  # HTN BP wnl - cont home metop   DVT prophylaxis: SCDs currently Code Status: full  Family Communication: none @ bedside  Consults called: orthopedics   Level of care: Med-Surg Status is: Inpatient Remains inpatient appropriate because: severity of illness            Silvano Bilis MD [facility] Pager [phone]  If 7PM-7AM, please contact night-coverage [email or web address] Password [facility]  09/18/2021, 11:23 AM

## 2021-09-18 NOTE — Op Note (Signed)
OPERATIVE REPORT ? ? ?09/18/2021 ? ?5:46 PM ? ?PATIENT:  [name] [name]  ? ?SURGEON:  Jonette Pesa, MD ? ?ASSISTANT:  Dion Saucier, PA-C.  ? ?PREOPERATIVE DIAGNOSIS:  Right [place] B2 Periprostetic femur fracture. ? ?POSTOPERATIVE DIAGNOSIS:  Same. ? ?PROCEDURE PERFORMED: 1.  Revision femoral component right hip. ?2.  Conversion of previous surgery to total hip arthroplasty. ?3.  Interpretation of fluoroscopic images. ? ?ESTIMATED BLOOD LOSS: 800 mL ? ?BLOOD PRODUCTS: 2 units PRBCs ?  ?EXPLANTS: 1. Stryker Accolade 2 size stem. ?2. 48 mm monopolar head with +4 spacer. ?  ?IMPLANTS: 1. Biomet Arcos modular STS distal stem, size 15 x 150 mm. ?2. Arcos standard cone proximal body, size A, 60 mm, high offset. ?3. G7 OsseoTi cup, size 54 mm, F. ?4. Dual Mobility acetabular liner, size F. ?5. Dual mobility construct with 44 mm x 28 mm Vivacit-E bearing and 28 + 0 mm metal head ball. ?6. Zimmer 1.8 mm adult reconstruction cable x4. ?  ?SPECIMENS: None. ?  ?TUBES AND DRAINS: None. ?  ?COMPLICATIONS: None. ?  ?DISPOSITION: Stable to PACU. ?SURGICAL INDICATIONS:  [name] [name] is a 76 y.o. female with a diagnosis of right [place] B2 periprosthetic femur fracture.  She originally underwent right hip hemiarthroplasty via posterior approach by Dr. Martha Clan on 08/20/2021.  She presented to the emergency department today after a ground-level fall with right hip pain, and inability to weight-bear.  X-rays and CT scan revealed a [place] B2 periprosthetic femur fracture with subsided femoral component.  She also had significant acetabular cartilage wear superiorly.  She was indicated for revision femoral component, conversion to total hip arthroplasty. ? ?The risks, benefits, and alternatives were discussed with the patient preoperatively including but not limited to the risks of infection, bleeding, nerve / blood vessel injury, malunion, nonunion,  instability (giving out of the joint), dislocation, differences in leg  length/angulation/rotation, fracture of bones, loosening or failure of implants, hematoma (blood accumulation) which may require surgical drainage, blood clots, pulmonary embolism, and nerve injury (foot drop), cardiopulmonary complications, the need for repeat surgery, among others, and the patient was willing to proceed. ? ?PROCEDURE IN DETAIL: The patient was identified in the holding area using 2 identifiers.  The surgical site was marked by myself.  She was taken to the operating room, and general anesthesia was induced on the hospital bed.  A Foley catheter was placed.  She was then transferred supine to the operating room table.  She was then flipped to the left lateral decubitus position and secured with a beanbag.  An axillary roll was placed.  All bony prominences were well-padded.  The right hip was prepped and draped in the normal sterile surgical fashion.  Timeout was called, verifying site and site of surgery.  She did receive IV antibiotics within 60 minutes of beginning the procedure. ? ?The previous incision was excised with a #10 blade.  The incision was carried about 2 cm proximally, and about 12 cm distally.  Full-thickness skin flaps were created.  The IT band was split in line with fibers.  The sciatic nerve was palpated and protected throughout the case.  Fracture hematoma was encountered and evacuated.  The posterior capsule and short external rotators were taken down in a single layer.  The trunnion and monopolar head ball were identified.  Traction was applied, and the subsided femoral component was gently tapped from its subsided position to a more normal position.  The vastus lateralis was then peeled off the  posterior intermuscular septum and retracted anteriorly.  I identified the periprosthetic fracture.  Fracture hematoma was removed with a curette.  Fracture was cleaned with normal saline using pulse lavage.  Anatomic reduction was achieved and held with a lobster claw.  I then  placed a single prophylactic adult reconstruction cable subperiosteally about 2 fingerbreadths distal to the fracture.  3 additional cables were placed over the fracture, the first was placed along the calcar; the next was placed just proximal to the lesser trochanter; and the third and final cable was placed just distal to the gluteus maximus tendon insertion site.  The cables were all provisionally tightened. ? ?Charnley retractor was placed.  The proximal femur was delivered into the wound.  Using a bone tamp, I was able to remove the monopolar head ball and spacer easily.  Scar tissue was removed from the shoulder.  Using a loop extractor, I was able to easily remove the femoral component.  I sequentially reamed up to a 15 mm reamer, planning for 150 mm stem.  Fluoroscopy was then brought in, and I obtained AP and lateral views to confirm fracture reduction, cable placement, and to verify that the fracture had been bypassed by 4 cortices.  The real stem was inserted to the appropriate depth.  I reamed proximally for a size a body. ? ?The femur was then translated anteriorly, and acetabular retractors were placed.  The labrum and pulmonary were removed.  I sequentially reamed up to a 53 mm reamer.  A 54 mm cup was opened and impacted into place at approximately 42 degrees of inclination and 20 degrees of anteversion.  The cup achieved excellent purchase.  The real dual mobility liner was impacted into place. ? ?The proximal femur was then delivered into the wound yet again.  The trial body was applied, taking care to restore appropriate anteversion.  Trial dual mobility bearing was placed, and the hip was reduced.  Stability testing was then performed.  She was stable in potty position.  In full extension and external rotation, there was no impingement or dislocation.  At neutral abduction and 90 degrees of flexion, I was able to internally rotate her to 90 degrees without any instability.  Leg lengths were  palpated and felt to be equal.  The hip was gently dislocated, and all trial components were removed.  The real size a 16 mm high offset body was impacted onto the stem.  The bolt was tightened according to manufacturer's instructions.  The real dual mobility bearing was assembled on the back table and impacted onto the trunnion.  The hip was reduced.  Stability testing was repeated with identical findings.  All cables were retightened, secured, and crimped. ? ?The wound was then irrigated with Irrisept solution followed by normal saline using pulse lavage.  The capsule and external rotators were repaired with #1 Vicryl suture.  The IT band and gluteal fascia were repaired with #1 Vicryl and #1 strata fix suture.  The deep fatty layer was closed with 2-0 Vicryl.  Deep dermal layer was closed with 2-0 Monocryl.  Skin was closed with staples.  Dermabond was applied to the skin.  Once the glue was fully dried, an Aquacel Ag dressing was placed.  She was then flipped supine, extubated, and taken to the PACU in stable condition.  Sponge, needle, and instrument counts were correct at the end of the case x2.  There were no known complications. ? ?Please note that an assistant was medically necessary to  aid with positioning, positioning of the limb, retraction of neurovascular structures, reduction of the fracture, anatomic placement of implants, and wound closure. ? ?POSTOPERATIVE PLAN: Postoperatively, she will be readmitted to the hospitalist.  She may weight-bear as tolerated with a walker.  Posterior hip precautions.  Begin aspirin for DVT prophylaxis.  Resume home prednisone dose.  Stress dose IV hydrocortisone will be given this evening.  Mobilize out of bed with PT/OT.  Routine IV antibiotics.  She will undergo disposition planning.  Return to the office in 2 weeks for routine postoperative care. ?

## 2021-09-18 NOTE — ED Provider Notes (Signed)
?  Physical Exam  ?There were no vitals taken for this visit. ? ?Physical Exam ? ?Procedures  ?Procedures ? ?ED Course / MDM  ?  ?Medical Decision Making ? ?Transfer from [facility] for admission.  Periprosthetic hip fracture.  Appears had been made inpatient at [facility] but will contact both surgery and hospitalist here to let them know that she is here ? ? ? ? ?  ?Benjiman Core, MD ?09/18/21 1126 ? ?

## 2021-09-18 NOTE — Transfer of Care (Signed)
Immediate Anesthesia Transfer of Care Note ? ?Patient: [name] [name] ? ?Procedure(s) Performed: FEMORAL COMPONENT REVISION CONVERSION TO TOTAL HIP ARTHROPLASTY (Right: Hip) ? ?Patient Location: PACU ? ?Anesthesia Type:General ? ?Level of Consciousness: sedated, patient cooperative and responds to stimulation ? ?Airway & Oxygen Therapy: Patient Spontanous Breathing and Patient connected to face mask oxygen ? ?Post-op Assessment: Report given to RN and Post -op Vital signs reviewed and stable ? ?Post vital signs: Reviewed and stable ? ?Last Vitals:  ?Vitals Value Taken Time  ?BP 128/94 09/18/21 1800  ?Temp    ?Pulse 94 09/18/21 1801  ?Resp 15 09/18/21 1801  ?SpO2 95 % 09/18/21 1801  ?Vitals shown include unvalidated device data. ? ?Last Pain:  ?Vitals:  ? 09/18/21 1126  ?TempSrc:   ?PainSc: 3   ?   ? ?  ? ?Complications: No notable events documented. ?

## 2021-09-18 NOTE — Progress Notes (Signed)
Consult received from [facility] at [facility] for periprosthetic RIGHT femur fracture. She is s/p R hip hemiarthroplasty on 08/20/2021 by Dr. Martha Clan. She fell yesterday and has been unable to WB. This is an unstable injury that requires surgical treatment. Plan for revision femoral component right hip, possible conversion to THA, at [facility] today. The OR charge nurse has requested [facility] transfer to the ED at [facility]. Plan for [facility] admit at [facility] postoperatively. Continue NPO, hold chemical DVT ppx. Will see patient in preop holding. ?

## 2021-09-18 NOTE — Discharge Instructions (Signed)
? ?Dr. Kayleena Eke ?Joint Replacement Specialist ?[facility] ?[address] ?[place], [place] [address] ?[phone] ? ? ?TOTAL HIP REPLACEMENT POSTOPERATIVE DIRECTIONS ? ? ? ?Hip Rehabilitation, Guidelines Following Surgery  ? ?WEIGHT BEARING ?Weight bearing as tolerated with assist device (walker, cane, etc) as directed, use it as long as suggested by your surgeon or therapist, typically at least 4-6 weeks. ? ?The results of a hip operation are greatly improved after range of motion and muscle strengthening exercises. Follow all safety measures which are given to protect your hip. If any of these exercises cause increased pain or swelling in your joint, decrease the amount until you are comfortable again. Then slowly increase the exercises. Call your caregiver if you have problems or questions.  ? ?HOME CARE INSTRUCTIONS  ?Most of the following instructions are designed to prevent the dislocation of your new hip.  ?Remove items at home which could result in a fall. This includes throw rugs or furniture in walking pathways.  ?Continue medications as instructed at time of discharge. ?You may have some home medications which will be placed on hold until you complete the course of blood thinner medication. ?You may start showering once you are discharged home. Do not remove your dressing. ?Do not put on socks or shoes without following the instructions of your caregivers.   ?Sit on chairs with arms. Use the chair arms to help push yourself up when arising.  ?Arrange for the use of a toilet seat elevator so you are not sitting low.  ?Walk with walker as instructed.  ?You may resume a sexual relationship in one month or when given the OK by your caregiver.  ?Use walker as long as suggested by your caregivers.  ?You may put full weight on your legs and walk as much as is comfortable. ?Avoid periods of inactivity such as sitting longer than an hour when not asleep. This helps prevent blood  clots.  ?You may return to work once you are cleared by your surgeon.  ?Do not drive a car for 6 weeks or until released by your surgeon.  ?Do not drive while taking narcotics.  ?Wear elastic stockings for two weeks following surgery during the day but you may remove then at night.  ?Make sure you keep all of your appointments after your operation with all of your doctors and caregivers. You should call the office at the above phone number and make an appointment for approximately two weeks after the date of your surgery. ?Please pick up a stool softener and laxative for home use as long as you are requiring pain medications. ?ICE to the affected hip every three hours for 30 minutes at a time and then as needed for pain and swelling. Continue to use ice on the hip for pain and swelling from surgery. You may notice swelling that will progress down to the foot and ankle.  This is normal after surgery.  Elevate the leg when you are not up walking on it.   ?It is important for you to complete the blood thinner medication as prescribed by your doctor. ?Continue to use the breathing machine which will help keep your temperature down.  It is common for your temperature to cycle up and down following surgery, especially at night when you are not up moving around and exerting yourself.  The breathing machine keeps your lungs expanded and your temperature down. ? ?RANGE OF MOTION AND STRENGTHENING EXERCISES  ?These exercises are designed to help you   keep full movement of your hip joint. Follow your caregiver's or physical therapist's instructions. Perform all exercises about fifteen times, three times per day or as directed. Exercise both hips, even if you have had only one joint replacement. These exercises can be done on a training (exercise) mat, on the floor, on a table or on a bed. Use whatever works the best and is most comfortable for you. Use music or television while you are exercising so that the exercises are a  pleasant break in your day. This will make your life better with the exercises acting as a break in routine you can look forward to.  ?Lying on your back, slowly slide your foot toward your buttocks, raising your knee up off the floor. Then slowly slide your foot back down until your leg is straight again.  ?Lying on your back spread your legs as far apart as you can without causing discomfort.  ?Lying on your side, raise your upper leg and foot straight up from the floor as far as is comfortable. Slowly lower the leg and repeat.  ?Lying on your back, tighten up the muscle in the front of your thigh (quadriceps muscles). You can do this by keeping your leg straight and trying to raise your heel off the floor. This helps strengthen the largest muscle supporting your knee.  ?Lying on your back, tighten up the muscles of your buttocks both with the legs straight and with the knee bent at a comfortable angle while keeping your heel on the floor.  ? ?SKILLED REHAB INSTRUCTIONS: ?If the patient is transferred to a skilled rehab facility following release from the hospital, a list of the current medications will be sent to the facility for the patient to continue.  When discharged from the skilled rehab facility, please have the facility set up the patient's Home Health Physical Therapy prior to being released. Also, the skilled facility will be responsible for providing the patient with their medications at time of release from the facility to include their pain medication and their blood thinner medication. If the patient is still at the rehab facility at time of the two week follow up appointment, the skilled rehab facility will also need to assist the patient in arranging follow up appointment in our office and any transportation needs. ? ?POST-OPERATIVE OPIOID TAPER INSTRUCTIONS: ?It is important to wean off of your opioid medication as soon as possible. If you do not need pain medication after your surgery it is ok  to stop day one. ?Opioids include: ?Codeine, Hydrocodone(Norco, Vicodin), Oxycodone(Percocet, oxycontin) and hydromorphone amongst others.  ?Long term and even short term use of opiods can cause: ?Increased pain response ?Dependence ?Constipation ?Depression ?Respiratory depression ?And more.  ?Withdrawal symptoms can include ?Flu like symptoms ?Nausea, vomiting ?And more ?Techniques to manage these symptoms ?Hydrate well ?Eat regular healthy meals ?Stay active ?Use relaxation techniques(deep breathing, meditating, yoga) ?Do Not substitute Alcohol to help with tapering ?If you have been on opioids for less than two weeks and do not have pain than it is ok to stop all together.  ?Plan to wean off of opioids ?This plan should start within one week post op of your joint replacement. ?Maintain the same interval or time between taking each dose and first decrease the dose.  ?Cut the total daily intake of opioids by one tablet each day ?Next start to increase the time between doses. ?The last dose that should be eliminated is the evening dose.  ? ? ?MAKE   SURE YOU:  ?Understand these instructions.  ?Will watch your condition.  ?Will get help right away if you are not doing well or get worse. ? ?Pick up stool softner and laxative for home use following surgery while on pain medications. ?Do not remove your dressing. ?The dressing is waterproof--it is OK to take showers. ?Continue to use ice for pain and swelling after surgery. ?Do not use any lotions or creams on the incision until instructed by your surgeon. ?Total Hip Protocol. ? ?

## 2021-09-18 NOTE — Anesthesia Postprocedure Evaluation (Signed)
Anesthesia Post Note ? ?Patient: [name] [name] ? ?Procedure(s) Performed: FEMORAL COMPONENT REVISION CONVERSION TO TOTAL HIP ARTHROPLASTY (Right: Hip) ? ?  ? ?Patient location during evaluation: PACU ?Anesthesia Type: General ?Level of consciousness: awake and alert ?Pain management: pain level controlled ?Vital Signs Assessment: post-procedure vital signs reviewed and stable ?Respiratory status: spontaneous breathing, nonlabored ventilation, respiratory function stable and patient connected to nasal cannula oxygen ?Cardiovascular status: blood pressure returned to baseline and stable ?Postop Assessment: no apparent nausea or vomiting ?Anesthetic complications: no ? ? ?No notable events documented. ? ?Last Vitals:  ?Vitals:  ? 09/18/21 1919 09/18/21 2025  ?BP: 115/83 102/74  ?Pulse: (!) 105 (!) 106  ?Resp: 18 18  ?Temp: (!) 36.3 ?C (!) 36.4 ?C  ?SpO2: 100% 100%  ?  ?Last Pain:  ?Vitals:  ? 09/18/21 1919  ?TempSrc: Oral  ?PainSc:   ? ? ?  ?  ?  ?  ?  ?  ? ?Jemaine Prokop ? ? ? ? ?

## 2021-09-19 ENCOUNTER — Inpatient Hospital Stay (HOSPITAL_COMMUNITY): Payer: Medicare PPO

## 2021-09-19 DIAGNOSIS — R569 Unspecified convulsions: Secondary | ICD-10-CM

## 2021-09-19 DIAGNOSIS — D849 Immunodeficiency, unspecified: Secondary | ICD-10-CM

## 2021-09-19 DIAGNOSIS — L8990 Pressure ulcer of unspecified site, unspecified stage: Secondary | ICD-10-CM | POA: Diagnosis present

## 2021-09-19 DIAGNOSIS — E871 Hypo-osmolality and hyponatremia: Secondary | ICD-10-CM

## 2021-09-19 DIAGNOSIS — K754 Autoimmune hepatitis: Secondary | ICD-10-CM

## 2021-09-19 LAB — CBC
HCT: 33.4 % — ABNORMAL LOW (ref 36.0–46.0)
Hemoglobin: 11 g/dL — ABNORMAL LOW (ref 12.0–15.0)
MCH: 28.3 pg (ref 26.0–34.0)
MCHC: 32.9 g/dL (ref 30.0–36.0)
MCV: 85.9 fL (ref 80.0–100.0)
Platelets: 176 10*3/uL (ref 150–400)
RBC: 3.89 MIL/uL (ref 3.87–5.11)
RDW: 17.6 % — ABNORMAL HIGH (ref 11.5–15.5)
WBC: 18.7 10*3/uL — ABNORMAL HIGH (ref 4.0–10.5)
nRBC: 0 % (ref 0.0–0.2)

## 2021-09-19 LAB — GLUCOSE, CAPILLARY
Glucose-Capillary: 153 mg/dL — ABNORMAL HIGH (ref 70–99)
Glucose-Capillary: 178 mg/dL — ABNORMAL HIGH (ref 70–99)

## 2021-09-19 LAB — URINALYSIS, ROUTINE W REFLEX MICROSCOPIC
Bilirubin Urine: NEGATIVE
Glucose, UA: NEGATIVE mg/dL
Hgb urine dipstick: NEGATIVE
Ketones, ur: 5 mg/dL — AB
Leukocytes,Ua: NEGATIVE
Nitrite: NEGATIVE
Protein, ur: NEGATIVE mg/dL
Specific Gravity, Urine: 1.038 — ABNORMAL HIGH (ref 1.005–1.030)
pH: 5 (ref 5.0–8.0)

## 2021-09-19 LAB — BASIC METABOLIC PANEL
Anion gap: 8 (ref 5–15)
BUN: 27 mg/dL — ABNORMAL HIGH (ref 8–23)
CO2: 17 mmol/L — ABNORMAL LOW (ref 22–32)
Calcium: 7.7 mg/dL — ABNORMAL LOW (ref 8.9–10.3)
Chloride: 105 mmol/L (ref 98–111)
Creatinine, Ser: 0.84 mg/dL (ref 0.44–1.00)
GFR, Estimated: >60 mL/min (ref >60)
Glucose, Bld: 167 mg/dL — ABNORMAL HIGH (ref 70–99)
Potassium: 4.5 mmol/L (ref 3.5–5.1)
Sodium: 130 mmol/L — ABNORMAL LOW (ref 135–145)

## 2021-09-19 LAB — OSMOLALITY, URINE: Osmolality, Ur: 659 mOsm/kg (ref 300–900)

## 2021-09-19 LAB — SODIUM, URINE, RANDOM: Sodium, Ur: <10 mmol/L

## 2021-09-19 LAB — AMMONIA: Ammonia: 14 umol/L (ref 9–35)

## 2021-09-19 LAB — OSMOLALITY: Osmolality: 285 mOsm/kg (ref 275–295)

## 2021-09-19 MED ORDER — SODIUM CHLORIDE 0.9 % IV SOLN: INTRAVENOUS | Status: AC

## 2021-09-19 MED ORDER — LEVETIRACETAM IN NACL 1500 MG/100ML IV SOLN
1500.0000 mg | Freq: Once | INTRAVENOUS | Status: AC
Start: 2021-09-19 — End: 2021-09-19
  Administered 2021-09-19: 1500 mg via INTRAVENOUS
  Filled 2021-09-19: qty 100

## 2021-09-19 MED ORDER — METHOCARBAMOL 500 MG PO TABS
500.0000 mg | ORAL_TABLET | Freq: Once | ORAL | Status: DC
Start: 2021-09-19 — End: 2021-09-24
  Filled 2021-09-19: qty 1

## 2021-09-19 MED ORDER — LEVETIRACETAM 500 MG PO TABS: 500.0000 mg | ORAL_TABLET | Freq: Two times a day (BID) | ORAL | Status: DC

## 2021-09-19 MED ORDER — LORAZEPAM 2 MG/ML IJ SOLN
2.0000 mg | Freq: Four times a day (QID) | INTRAMUSCULAR | Status: DC | PRN
  Administered 2021-09-19: 2 mg via INTRAVENOUS
  Filled 2021-09-19: qty 1

## 2021-09-19 MED ORDER — LEVETIRACETAM IN NACL 500 MG/100ML IV SOLN
500.0000 mg | Freq: Two times a day (BID) | INTRAVENOUS | Status: DC
  Administered 2021-09-19 – 2021-09-21 (×4): 500 mg via INTRAVENOUS
  Filled 2021-09-19 (×4): qty 100

## 2021-09-19 NOTE — Plan of Care (Signed)
°  Problem: Education: °Goal: Knowledge of the prescribed therapeutic regimen will improve °Outcome: Progressing °  °Problem: Clinical Measurements: °Goal: Postoperative complications will be avoided or minimized °Outcome: Progressing °  °Problem: Pain Management: °Goal: Pain level will decrease with appropriate interventions °Outcome: Progressing °  °

## 2021-09-19 NOTE — Progress Notes (Signed)
?  Pt had multiple episodes of seizure during night. First one was noted at 1:30, she was jerking her left hand and left shoulder. NP Blount informed. Received order for muscle relaxer. Another one was noted at 5:20. Pt was jerking her whole body at that time. Rapid response notified. Vital signs and blood sugar checked.  No orders received. Again at 7 am pt was found having another episode of short seizure. Pt was alert and oriented after seizure. Report given to morning shift nurse. Attending notified. Will continue to monitor patient.  ?

## 2021-09-19 NOTE — Progress Notes (Signed)
OT Cancellation Note ? ?Patient Details ?Name: [name] [name] ?MRN: [id number] ?DOB: Feb 03, 1946 ? ? ?Cancelled Treatment:    Reason Eval/Treat Not Completed: Medical issues which prohibited therapy ?RN asking for therapy to hold off at this time with patient having onset of seizures this AM. OT will continue to follow and check back when patient is medically stable.  ?Nikoloz Huy OTR/L, MS ?Acute Rehabilitation Department ?Office# [phone] ?Pager# [phone] ? ? ?09/19/2021, 9:23 AM ?

## 2021-09-19 NOTE — Progress Notes (Signed)
Rapid Response Event Note  ? ?Reason for Call :  pt having a hard time breathing per nurse, seizure? ? ? ?Initial Focused Assessment:  Pt A/O and following commands, lungs clear and decreased.  Pt has a left arm twitch and left leg rigidity.  She says this is not new for her. Pt has had right hip surgery (see operative notes).  Pt pupils equal.  Speech intact. Grips intact.  Abnormal activity resolved by the time I arrived to pt bedside. ? ?Interventions:  Pt RN contacted provider and pt given robaxin, I spoke with provider as well.  Report given of pt status at this time.  No new orders received AM Team to evaluate pt.  See Flow sheet for VS.   ? ? ?Plan of Care: Pt will stay in current room d/t VS are stable per provider and pt does not have active signs of a seizure at this time. ? ? ? ?Event Summary:  ? ?MD Notified: Yes  ?Call Time: 0515 ?Arrival Time: [phone] ?End Time: 0600 ? ?Conley Rolls, RN ?

## 2021-09-19 NOTE — Progress Notes (Signed)
PT Cancellation Note ? ?Patient Details ?Name: [name] [name] ?MRN: [id number] ?DOB: [birth year] ? ? ?Cancelled Treatment:     PT order received but eval deferred - RN advises pt experiencing seizures.  Will follow. ? ? ?Donneisha Beane ?09/19/2021, 8:30 AM ?

## 2021-09-19 NOTE — Plan of Care (Signed)

## 2021-09-19 NOTE — Consult Note (Signed)
NEUROLOGY CONSULTATION NOTE   Date of service: September 19, 2021 Patient Name: [name] [name] MRN:  [id number] DOB:  07/18/1946 Reason for consult: "seizures vs myoclonus" Requesting Provider: Albertine Grates, MD _ _ _   _ __   _ __ _ _  __ __   _ __   __ _  History of Present Illness   [name] [name] is a 76 y.o. female with pmhx of anemia, HTN, autoimmune hepatitis , CKD3A, GERD, HLD, osteoporosis and no history of tremors or shaking. It is of note that last month she was hospitalized with a right hip fracture and repair and urinary retention and was in inpatient rehab up until two days prior. She was ambulating with a cane s/p surgery.    She presented to [facility] ED after she suffered a mechanical fall at home 09/17/21. Per H&P, she lost her balance while walking from her kitchen to her hallway and fell to her right side. She denied LOC or head injury. Unable to get up due to her right hip pain, she called EMS. Imaging at [facility] showed a right periprosthetic fracture. She was taken back to surgery for a revision of femoral component right hip and conversion of previous surgery to total hip arthroplasty on 09/18/21 and is POD#1. It is of note that the patient was in surgery longer for this surgery and subsequently likely required more anesthesia.    Neurology was consulted after nursing witnessed seizure like episodes. Per the night RN: Pt had multiple episodes of seizure during night and again this morning. First one was noted at 1:30, she was jerking her left hand and left shoulder. NP Blount informed. Received order for muscle relaxer. Another one was noted at 5:20. Pt was jerking her whole body at that time. Rapid response notified. Vital signs and blood sugar checked.  No orders received. Again at 7 am pt was found having another episode of short seizure. Pt was alert and oriented after seizure. Report given to morning shift nurse. Attending notified. Will continue to monitor patient.    I spoke  with her bedside RN since 7am, [name] [name], who witnessed the seizure activity. She described the episode this morning as the patient's entire body jerking, eyes rolled back in her head, pt was not responsive and lasting about two minutes. [name] [name] stated that [name] [name] was drowsy after episode subsided but was able to tell her name and DOB. Per [name] [name] and nightime nurse the patient's episodes first started on the Left side of her body and then moved to right side as well.    Ativan at 745am relaxed [name] [name] after the episode and she was resting when I went to bedside. She is ordered Morphine and Norco for post op pain. Her last dose of Morphine 0.5mg  was 11pm last night. She received he most recent Robaxin dose at 0314 this am. She has not been given Norco and she denies pain at this time.   ROS   Denies any pain but unable to obtain detailed review of system due to encephalopathy.  Past History   Past Medical History:  Diagnosis Date   Abnormal LFTs (liver function tests)    Autoimmune hepatitis (HCC) 2018   Autoimmune hepatitis (HCC)    Autoimmune hepatitis treated with steroids (HCC)    DOE (dyspnea on exertion)    GERD (gastroesophageal reflux disease)    Hepatitis    Hyperlipidemia    Hypertension    Iron deficiency anemia due  to chronic blood loss 05/27/2021   Osteoporosis    Past Surgical History:  Procedure Laterality Date   ABDOMINAL HYSTERECTOMY     COLONOSCOPY  10/06/2015   COLONOSCOPY WITH PROPOFOL N/A 08/27/2018   Procedure: COLONOSCOPY WITH PROPOFOL;  Surgeon: Scot Jun, MD;  Location: [facility] ENDOSCOPY;  Service: Endoscopy;  Laterality: N/A;   ESOPHAGOGASTRODUODENOSCOPY (EGD) WITH PROPOFOL N/A 08/27/2018   Procedure: ESOPHAGOGASTRODUODENOSCOPY (EGD) WITH PROPOFOL;  Surgeon: Scot Jun, MD;  Location: [facility] ENDOSCOPY;  Service: Endoscopy;  Laterality: N/A;   ESOPHAGOGASTRODUODENOSCOPY (EGD) WITH PROPOFOL N/A 05/21/2021   Procedure: ESOPHAGOGASTRODUODENOSCOPY (EGD)  WITH PROPOFOL;  Surgeon: Regis Bill, MD;  Location: [facility] ENDOSCOPY;  Service: Endoscopy;  Laterality: N/A;   HIP ARTHROPLASTY Right 08/20/2021   Procedure: ARTHROPLASTY BIPOLAR HIP (HEMIARTHROPLASTY);  Surgeon: Juanell Fairly, MD;  Location: [facility] ORS;  Service: Orthopedics;  Laterality: Right;   LIVER BIOPSY     Family History  Problem Relation Age of Onset   Cancer Mother    Social History   Socioeconomic History   Marital status: Married    Spouse name: Not on file   Number of children: Not on file   Years of education: Not on file   Highest education level: Not on file  Occupational History   Not on file  Tobacco Use   Smoking status: Never   Smokeless tobacco: Never  Vaping Use   Vaping Use: Never used  Substance and Sexual Activity   Alcohol use: No   Drug use: Never   Sexual activity: Not Currently    Birth control/protection: Post-menopausal  Other Topics Concern   Not on file  Social History Narrative   Not on file   Social Determinants of Health   Financial Resource Strain: Not on file  Food Insecurity: Not on file  Transportation Needs: Not on file  Physical Activity: Not on file  Stress: Not on file  Social Connections: Not on file   Allergies  Allergen Reactions   Sulfa Antibiotics Nausea And Vomiting    Pt has not had a sulfa drug in years and is not certain of reaction but suspects nausea and vomiting.   Sulfasalazine Nausea And Vomiting    Medications   Medications Prior to Admission  Medication Sig Dispense Refill Last Dose   acetaminophen (TYLENOL) 325 MG tablet Take 2 tablets (650 mg total) by mouth every 6 (six) hours as needed for mild pain or moderate pain (pain score 1-3 or temp > 100.5).   Past Month   aspirin EC 81 MG tablet Take 81 mg by mouth daily.    09/17/2021   azaTHIOprine (IMURAN) 50 MG tablet Take 25 mg by mouth daily.  0 09/17/2021   Calcium Carbonate Antacid 600 MG chewable tablet Chew 600 mg by mouth.   09/17/2021    calcium citrate-vitamin D (CITRACAL+D) 315-200 MG-UNIT tablet Take 1 tablet by mouth 2 (two) times daily.   09/17/2021   cyanocobalamin 100 MCG tablet Take 100 mcg by mouth daily.   09/17/2021   enoxaparin (LOVENOX) 30 MG/0.3ML injection Inject 0.3 mLs (30 mg total) into the skin daily for 14 days. 4.2 mL 0 Two weeks ago   estradiol (ESTRACE) 0.1 MG/GM vaginal cream Place 1 Applicatorful vaginally 3 (three) times a week.   Past Week   feeding supplement (ENSURE ENLIVE / ENSURE PLUS) LIQD Take 237 mLs by mouth 2 (two) times daily between meals. 14220 mL 0 09/17/2021   Ferrous Fumarate (HEMOCYTE - 106 MG FE) 324 (106  Fe) MG TABS tablet Take 1 tablet by mouth daily.   09/17/2021   metoprolol tartrate (LOPRESSOR) 50 MG tablet Take 50 mg by mouth daily.   09/17/2021 at 0930   Multiple Vitamin (MULTIVITAMIN WITH MINERALS) TABS tablet Take 1 tablet by mouth daily. 30 tablet 0 09/17/2021   omeprazole (PRILOSEC) 40 MG capsule Take 40 mg by mouth daily.   09/17/2021   polyethylene glycol (MIRALAX / GLYCOLAX) 17 g packet Take 17 g by mouth daily as needed for mild constipation. 30 each 0 More than a month ago   potassium chloride (KLOR-CON) 10 MEQ tablet Take 10 mEq by mouth daily.   09/17/2021   predniSONE (DELTASONE) 5 MG tablet Take 7.5 mg by mouth daily.  3 09/17/2021   sulfamethoxazole-trimethoprim (BACTRIM DS) 800-160 MG tablet Take 1 tablet by mouth daily.   09/17/2021   HYDROcodone-acetaminophen (NORCO/VICODIN) 5-325 MG tablet Take 1 tablet by mouth every 6 (six) hours as needed for severe pain. (Patient not taking: Reported on 09/18/2021) 10 tablet 0 Not Taking   Misc. Devices (WALKER) MISC 1 Device by Does not apply route as directed. 1 each 0      Vitals   Vitals:   09/18/21 2240 09/19/21 0137 09/19/21 0523 09/19/21 0533  BP:  128/70 128/86 123/90  Pulse: (!) 103 100 91 88  Resp:  18 18 18   Temp:  97.8 F (36.6 C) (!) 97.4 F (36.3 C) (!) 97.3 F (36.3 C)  TempSrc:   Oral Oral  SpO2:  96% 100% 99%      There is no height or weight on file to calculate BMI.  Physical Exam   General: Laying comfortably in bed; in no acute distress.  HENT: Normal oropharynx and mucosa. Normal external appearance of ears and nose.  Neck: Supple, no pain or tenderness  CV: No JVD. No peripheral edema.  Pulmonary: Symmetric Chest rise. Normal respiratory effort.  Abdomen: Soft to touch, non-tender.  Ext: No cyanosis, edema, or deformity  Skin: No rash. Normal palpation of skin.   Musculoskeletal: Normal digits and nails by inspection. No clubbing.   Neurologic Examination  Mental status/Cognition: bradyphrenic, eyes partially open. Opens her eye more fully and does wake up some during exam, oriented to self, place, month but not year. Poor attention.  Speech/language: Non fluent, comprehension intact to simple commands intermittently. Appears lethargic which I think limits her ability to participate. Does not make attempt no name objects despite multiple tries.  Cranial nerves:   CN II Pupils equal and reactive to light, no VF deficits   CN III,IV,VI EOM intact, no gaze preference or deviation, no nystagmus   CN V normal sensation in V1, V2, and V3 segments bilaterally   CN VII no asymmetry, no nasolabial fold flattening   CN VIII normal hearing to speech   CN IX & X normal palatal elevation, no uvular deviation   CN XI 5/5 head turn and 5/5 shoulder shrug bilaterally   CN XII midline tongue protrusion   Motor:  Muscle bulk: poor, tone normal Lethargy precludes detailed motor strength testing but her handgrip is 5 out of 5 in right upper extremity and 5 out of 5 with right elbow flexion and extension. She is at most a 3 out of 5 with her left hand grip and with left elbow flexion and extension.  She is a 4/5 in dorsiflexion and planterflexion. She would not lift her legs off the bed for me and seems to have pain when I  passively tried to raise her legs up briefly.  Reflexes:  Right Left Comments   Pectoralis      Biceps (C5/6) 2 2   Brachioradialis (C5/6) 2 2    Triceps (C6/7) 2 2    Patellar (L3/4) 1 1    Achilles (S1) 1 1    Hoffman      Plantar     Jaw jerk    Sensation:  Light touch Reports feels different on both sides but unable to clarify if she has decreased sensation to touch on the left or the right.   Pin prick    Temperature    Vibration   Proprioception    Coordination/Complex Motor:  - Finger to Nose intact in RUE. Unable to do with LUE - Heel to shin unable to assess. - Rapid alternating movement are slowed throughout. - Gait: deferred for patient safety.  Labs   CBC:  Recent Labs  Lab 09/17/21 1220 09/18/21 1905 09/19/21 0219  WBC 6.7 21.4*   24.2* 18.7*  NEUTROABS 4.8 18.6*  --   HGB 10.7* 12.3   12.3 11.0*  HCT 35.9* 39.4   38.7 33.4*  MCV 94.2 90.6   90.8 85.9  PLT 228 174   188 176    Basic Metabolic Panel:  Lab Results  Component Value Date   NA 130 (L) 09/19/2021   K 4.5 09/19/2021   CO2 17 (L) 09/19/2021   GLUCOSE 167 (H) 09/19/2021   BUN 27 (H) 09/19/2021   CREATININE 0.84 09/19/2021   CALCIUM 7.7 (L) 09/19/2021   GFRNONAA >60 09/19/2021   GFRAA 56 (L) 05/02/2018   Lipid Panel: No results found for: LDLCALC HgbA1c: No results found for: HGBA1C Urine Drug Screen: No results found for: LABOPIA, COCAINSCRNUR, LABBENZ, AMPHETMU, THCU, LABBARB  Alcohol Level No results found for: [facility]  MRI Brain: Pending  MRI C spine: pending  rEEG:  pending  Impression   Viva S Lubin is a 76 y.o. female with PMH significant for  autoimmune hepatitis, HTN, CKD3a, HTN. Presenting with right hip pain after a fall and found to have Periprosthetic intertrochanteric fracture of the right femur. She had 3 episodes concerning for seizures. 2 of them overnight and 1 this AM which was witnessed by RN. The description seems concerning for a epileptic seizure with L arm jerking, back arching and entire body jerking and eyes rolled in the back of  her head.  Unclear cause for her seizures. No clear prior history of seizures noted in chart.She did have a fall and possible concussion? Seizure risk is also increased in the perioperative period. What is interesting is that on exam, she is somewhat weaker in her LUE compared to RUE and reports that it feels different to touch. Unable to quite tell if she has unilateral leg weakness.  For now, I would recommend continuing Keppra 500mg  BID for now along with seizure workup with routine EEG and MRI Brain. I would also recommend adding MRI C spine for further workup of noted LUE weakness. This may just be todds paresis or just poor participation with exam but I could not get her to squeeze my finger on the left despite multiple attempts.  Recommendations  - routine EEG pending. [facility] Brain and MRI C spine - Ativan 1mg  for seizure lasting more than 3 mins. - continue Keppra 500mg  BID. Can stop if MRI Brain and routine EEG are normal. - seizure pads. - If she has  more episodes despite Keppra, I would favor [facility] but she has not had any further episodes after she got Ativan and Keppra so I am not very inclined at this time. - we will continue to follow along. _____________________________________________________________________  Plan discussed with Dr. Roda ShuttersXu in person.  Thank you for the opportunity to take part in the care of this patient. If you have any further questions, please contact the neurology consultation attending.  Signed,  Erick BlinksSalman Johnatha Zeidman [facility] Pager Number [phone] _ _ _   _ __   _ __ _ _  __ __   _ __   __ _

## 2021-09-19 NOTE — Progress Notes (Addendum)
?PROGRESS NOTE ? ? ? ?[name] [name]  MEQ:[id number] DOB: [birth year] DOA: 09/18/2021 ?PCP: Margaretann Loveless, MD  ? ? ? ?Brief Narrative:  ? ?[name] [name] is a 76 y.o. female with medical history significant of autoimmune hepatitis, HTN, CKD3a,  Presenting with right hip pain after a fall ? ?Subjective: ? ?POD#1 ?Has seizure like activities  x2 last night, x1 this am ?She is currently continue have some jerky movement left shoulder /arm ?She is slightly forgetful this am, not able to remember her regular medication for hepatitis, still aaox3 this am ? ?Assessment & Plan: ? Principal Problem: ?  Periprosthetic intertrochanteric fracture of femur ?Active Problems: ?  Autoimmune hepatitis (HCC) ?  Hypertension ?  Stage 3a chronic kidney disease (CKD) (HCC) ?  Normocytic anemia ?  Pressure injury of skin ? ? ? ?Assessment and Plan: ? ?Right periprosthetic femur fracture ?-s/p 1.  Revision femoral component right hip. ?2.  Conversion of previous surgery to total hip arthroplasty ?On 3/4 by Dr Linna Caprice ?-Postop pain management, wound care, DVT prophylaxis per Ortho ? ?Seizures? Vs myocolonus  ?-No prior history of seizure ?-She is maintaining airway, slight memory impairment this morning, but able to provide reliable history ?-Discussed with neurology who recommend MRI brain, EEG, loading with Keppra, as needed Ativan for jerky movement with another 3 minutes ? ?Hyponatremia ?Appear dehydrated, start hydration ? ?Leukocytosis ?Currently does not have any sign of infection, UA no bacteria , chest x-ray no acute findings  ?will get blood culture ?Leukocytosis likely due to dehydration, stress, steroid ?Repeat CBC in the morning ? ?Impaired fasting blood glucose ?No prior history of diabetes, likely from stress and steroid ?check A1c ? ?Autoimmune hepatitis/immunosuppressed status ?Continue home medication, Imuran, prednisone, Bactrim ?Ammonia level unremarkable ? ? ?I have Reviewed nursing notes, Vitals, pain scores, I/o's,  Lab results and  imaging results since pt's last encounter, details please see discussion above  ?I ordered the following labs:  ?Unresulted Labs (From admission, onward)  ? ?  Start     Ordered  ? 09/20/21 0500  TSH  Tomorrow morning,   R       ? 09/19/21 0727  ? 09/20/21 0500  CBC with Differential/Platelet  Tomorrow morning,   R       ? 09/19/21 0727  ? 09/20/21 0500  Basic metabolic panel  Tomorrow morning,   R       ? 09/19/21 0727  ? 09/19/21 0807  Culture, blood (routine x 2)  BLOOD CULTURE X 2,   R (with TIMED occurrences)     ? 09/19/21 0806  ? 09/19/21 0500  CBC  Daily,   R     ? 09/18/21 1926  ? ?  ?  ? ?  ? ? ? ?DVT prophylaxis: SCDs Start: 09/18/21 1926 ?SCDs Start: 09/18/21 1926 ? ? ?Code Status:   Code Status: DNR ? ?Family Communication: husband over the phone ?Disposition:  ? ?Status is: Inpatient ? ? ?Dispo: The patient is from: home ?             Anticipated d/c is to: SNF ?             Anticipated d/c date is: TBD ? ?Antimicrobials:   ?Anti-infectives (From admission, onward)  ? ? Start     Dose/Rate Route Frequency Ordered Stop  ? 09/18/21 2100  ceFAZolin (ANCEF) IVPB 2g/100 mL premix       ? 2 g ?200 mL/hr over 30 Minutes Intravenous Every  6 hours 09/18/21 1926 09/19/21 0420  ? 09/18/21 2015  sulfamethoxazole-trimethoprim (BACTRIM DS) 800-160 MG per tablet 1 tablet  Status:  Discontinued       ? 1 tablet Oral Daily 09/18/21 1926 09/18/21 2049  ? 09/18/21 1400  ceFAZolin (ANCEF) IVPB 2g/100 mL premix       ? 2 g ?200 mL/hr over 30 Minutes Intravenous On call to O.R. 09/18/21 1330 09/18/21 1517  ? 09/18/21 1331  ceFAZolin (ANCEF) 2-4 GM/100ML-% IVPB       ?Note to Pharmacy: Hassie Bruce A: cabinet override  ?    09/18/21 1331 09/18/21 1517  ? ?  ? ? ? ? ? ? ?Objective: ?Vitals:  ? 09/19/21 0523 09/19/21 0533 09/19/21 1519 09/19/21 1957  ?BP: 128/86 123/90 (!) 109/59 (!) 144/76  ?Pulse: 91 88 81 81  ?Resp: 18 18 (!) 22 16  ?Temp: (!) 97.4 ?F (36.3 ?C) (!) 97.3 ?F (36.3 ?C) 98.3 ?F (36.8 ?C)  98.1 ?F (36.7 ?C)  ?TempSrc: Oral Oral Oral Oral  ?SpO2: 100% 99% 94% 94%  ? ? ?Intake/Output Summary (Last 24 hours) at 09/19/2021 2023 ?Last data filed at 09/19/2021 1751 ?Gross per 24 hour  ?Intake 2399.47 ml  ?Output 415 ml  ?Net 1984.47 ml  ? ?There were no vitals filed for this visit. ? ?Examination: ? ?General exam: alert, awake, communicative, slight memory impairment, left shoulder/ upper extremity jerking movement ?Respiratory system: Clear to auscultation. Respiratory effort normal. ?Cardiovascular system:  RRR.  ?Gastrointestinal system: Abdomen is nondistended, soft and nontender.  Normal bowel sounds heard. ?Central nervous system: Alert and oriented. No focal neurological deficits. ?Extremities: Right hip postop changes ?Skin: No rashes, lesions or ulcers ?Psychiatry: Slightly confused, calm and cooperative, no agitation.  ? ? ? ?Data Reviewed: I have personally reviewed  labs and visualized  imaging studies since the last encounter and formulate the plan  ? ? ? ? ? ? ?Scheduled Meds: ? aspirin  81 mg Oral BID  ? azaTHIOprine  25 mg Oral Daily  ? calcium carbonate  1 tablet Oral Daily  ? calcium citrate  200 mg of elemental calcium Oral BID  ? And  ? cholecalciferol  200 Units Oral BID  ? docusate sodium  100 mg Oral BID  ? feeding supplement  237 mL Oral BID BM  ? levETIRAcetam  500 mg Oral Q12H  ? methocarbamol  500 mg Oral Once  ? metoprolol tartrate  50 mg Oral Daily  ? multivitamin with minerals  1 tablet Oral Daily  ? pantoprazole  80 mg Oral Daily  ? predniSONE  7.5 mg Oral Daily  ? senna  1 tablet Oral BID  ? vitamin B-12  100 mcg Oral Daily  ? ?Continuous Infusions: ? sodium chloride    ? levETIRAcetam    ? methocarbamol (ROBAXIN) IV 500 mg (09/19/21 0314)  ? ? ? LOS: 1 day  ? ? ? ?Florencia Reasons, MD PhD FACP ?[facility] ? ?Available via Epic secure chat 7am-7pm for nonurgent issues ?Please page for urgent issues ?To page the attending provider between 7A-7P or the covering provider during after  hours 7P-7A, please log into the web site [email or web address] and access using universal [place] password for that web site. If you do not have the password, please call the hospital operator. ? ? ? ?09/19/2021, 8:23 PM  ? ? ?

## 2021-09-20 ENCOUNTER — Encounter (HOSPITAL_COMMUNITY): Payer: Self-pay | Admitting: Orthopedic Surgery

## 2021-09-20 ENCOUNTER — Inpatient Hospital Stay (HOSPITAL_COMMUNITY)
Admission: EM | Admit: 2021-09-20 | Discharge: 2021-09-20 | Disposition: A | Discharge status: Home / Self Care | Payer: Medicare PPO | Source: Ambulatory Visit | Attending: Internal Medicine | Admitting: Internal Medicine

## 2021-09-20 DIAGNOSIS — N1831 Chronic kidney disease, stage 3a: Secondary | ICD-10-CM

## 2021-09-20 DIAGNOSIS — I10 Essential (primary) hypertension: Secondary | ICD-10-CM

## 2021-09-20 DIAGNOSIS — R569 Unspecified convulsions: Secondary | ICD-10-CM

## 2021-09-20 DIAGNOSIS — M978XXA Periprosthetic fracture around other internal prosthetic joint, initial encounter: Secondary | ICD-10-CM | POA: Diagnosis not present

## 2021-09-20 DIAGNOSIS — K754 Autoimmune hepatitis: Secondary | ICD-10-CM | POA: Diagnosis not present

## 2021-09-20 LAB — CBC WITH DIFFERENTIAL/PLATELET
Abs Immature Granulocytes: 0.07 10*3/uL (ref 0.00–0.07)
Basophils Absolute: 0 10*3/uL (ref 0.0–0.1)
Basophils Relative: 0 %
Eosinophils Absolute: 0 10*3/uL (ref 0.0–0.5)
Eosinophils Relative: 0 %
HCT: 26.1 % — ABNORMAL LOW (ref 36.0–46.0)
Hemoglobin: 8.4 g/dL — ABNORMAL LOW (ref 12.0–15.0)
Immature Granulocytes: 1 %
Lymphocytes Relative: 5 %
Lymphs Abs: 0.5 10*3/uL — ABNORMAL LOW (ref 0.7–4.0)
MCH: 28.8 pg (ref 26.0–34.0)
MCHC: 32.2 g/dL (ref 30.0–36.0)
MCV: 89.4 fL (ref 80.0–100.0)
Monocytes Absolute: 0.7 10*3/uL (ref 0.1–1.0)
Monocytes Relative: 7 %
Neutro Abs: 9.6 10*3/uL — ABNORMAL HIGH (ref 1.7–7.7)
Neutrophils Relative %: 87 %
Platelets: 122 10*3/uL — ABNORMAL LOW (ref 150–400)
RBC: 2.92 MIL/uL — ABNORMAL LOW (ref 3.87–5.11)
RDW: 18 % — ABNORMAL HIGH (ref 11.5–15.5)
WBC: 10.9 10*3/uL — ABNORMAL HIGH (ref 4.0–10.5)
nRBC: 0 % (ref 0.0–0.2)

## 2021-09-20 LAB — BASIC METABOLIC PANEL WITH GFR
Anion gap: 5 (ref 5–15)
BUN: 21 mg/dL (ref 8–23)
CO2: 20 mmol/L — ABNORMAL LOW (ref 22–32)
Calcium: 8 mg/dL — ABNORMAL LOW (ref 8.9–10.3)
Chloride: 110 mmol/L (ref 98–111)
Creatinine, Ser: 0.87 mg/dL (ref 0.44–1.00)
GFR, Estimated: >60 mL/min
Glucose, Bld: 96 mg/dL (ref 70–99)
Potassium: 3.7 mmol/L (ref 3.5–5.1)
Sodium: 135 mmol/L (ref 135–145)

## 2021-09-20 LAB — HEMOGLOBIN A1C
Hgb A1c MFr Bld: 4.4 % — ABNORMAL LOW (ref 4.8–5.6)
Mean Plasma Glucose: 79.58 mg/dL

## 2021-09-20 LAB — TSH: TSH: 0.861 u[IU]/mL (ref 0.350–4.500)

## 2021-09-20 MED ORDER — ENSURE ENLIVE PO LIQD
237.0000 mL | Freq: Two times a day (BID) | ORAL | Status: DC
  Administered 2021-09-20 – 2021-09-22 (×2): 237 mL via ORAL

## 2021-09-20 MED ORDER — BOOST / RESOURCE BREEZE PO LIQD CUSTOM
1.0000 | Freq: Two times a day (BID) | ORAL | Status: DC
  Administered 2021-09-22 – 2021-09-24 (×4): 1 via ORAL

## 2021-09-20 NOTE — Assessment & Plan Note (Addendum)
-   Continue prednisone and azathioprine ?

## 2021-09-20 NOTE — Assessment & Plan Note (Addendum)
Stable relative to baseline 

## 2021-09-20 NOTE — [place] FL2 (Deleted)
? MEDICAID FL2 LEVEL OF CARE SCREENING TOOL  ?  ? ?IDENTIFICATION  ?Patient Name: ?[name] [name] Birthdate: 11-28-1945 Sex: female Admission Date (Current Location): ?09/18/2021  ?[place] and [place] Number: ? [place] ?  Facility and Address:  ?[facility],  [address], [place] ?     Provider Number: ?[id number]  ?Attending Physician Name and Address:  ?Arrien, Jimmy Picket,* ? Relative Name and Phone Number:  ?Detore,Cornelius Spouse [phone]   [phone] ?   ?Current Level of Care: ?Hospital Recommended Level of Care: ?[place] Prior Approval Number: ?  ? ?Date Approved/Denied: ?  PASRR Number: ?[id number] ? ?Discharge Plan: ?SNF ?  ? ?Current Diagnoses: ?Patient Active Problem List  ? Diagnosis Date Noted  ? Pressure injury of skin 09/19/2021  ? Periprosthetic intertrochanteric fracture of femur 09/18/2021  ? Closed right hip fracture ([place]) 09/18/2021  ? Normocytic anemia 09/18/2021  ? Acute urinary retention 08/22/2021  ? Acute postoperative anemia due to expected blood loss 08/22/2021  ? Thrombocytopenia ([place]) 08/22/2021  ? Acute kidney injury superimposed on CKD ([place]) 08/21/2021  ? Toenail fungus 08/21/2021  ? Hyponatremia 08/21/2021  ? Nausea 08/20/2021  ? Hypertension   ? GERD (gastroesophageal reflux disease)   ? Stage 3a chronic kidney disease (CKD) ([place])   ? Closed hip fracture requiring operative repair, right, sequela 08/19/2021  ? Autoimmune hepatitis ([place]) 08/19/2021  ? Iron deficiency anemia due to chronic blood loss 05/27/2021  ? Hyperparathyroidism due to renal insufficiency ([place]) 07/08/2019  ? Erosive gastritis 12/25/2018  ? Abnormal LFTs 02/20/2017  ? Senile osteoporosis 02/13/2017  ? ? ?Orientation RESPIRATION BLADDER Height & Weight   ?  ?Self, Situation, Place ? Normal External catheter Weight:   ?Height:     ?BEHAVIORAL SYMPTOMS/MOOD NEUROLOGICAL BOWEL NUTRITION STATUS  ?    Incontinent Diet (Regular)   ?AMBULATORY STATUS COMMUNICATION OF NEEDS Skin   ?Extensive Assist Verbally PU Stage and Appropriate Care ?  ?PU Stage 2 Dressing: Daily (Ecchymosis Bilateral arms, Rt hip incision-sier hydrog=fiber, sacrum stage 2) ?  ?    ?     ?     ? ? ?Personal Care Assistance Level of Assistance  ?Bathing, Feeding, Dressing Bathing Assistance: Maximum assistance ?Feeding assistance: Limited assistance ?Dressing Assistance: Maximum assistance ?   ? ?Functional Limitations Info  ?Sight, Hearing, Speech Sight Info: Adequate ?Hearing Info: Adequate ?Speech Info: Adequate  ? ? ?SPECIAL CARE FACTORS FREQUENCY  ?PT (By licensed PT), OT (By licensed OT)   ?  ?PT Frequency: x5 week ?OT Frequency: x5 week ?  ?  ?  ?   ? ? ?Contractures Contractures Info: Not present  ? ? ?Additional Factors Info  ?Code Status, Allergies Code Status Info: DNR ?Allergies Info: Sulfa Antibiotics, Sulfasalazine ?  ?  ?  ?   ? ?Current Medications (09/20/2021):  This is the current hospital active medication list ?Current Facility-Administered Medications  ?Medication Dose Route Frequency Provider Last Rate Last Admin  ? 0.9 %  sodium chloride infusion   Intravenous Continuous Florencia Reasons, MD 100 mL/hr at 09/20/21 1233 New Bag at 09/20/21 1233  ? acetaminophen (TYLENOL) tablet 325-650 mg  325-650 mg Oral Q6H PRN Rod Can, MD   650 mg at 09/20/21 1108  ? alum & mag hydroxide-simeth (MAALOX/MYLANTA) 200-200-20 MG/5ML suspension 30 mL  30 mL Oral Q4H PRN Swinteck, Aaron Edelman, MD      ? aspirin chewable tablet 81 mg  81 mg Oral BID Rod Can, MD  81 mg at 09/20/21 0957  ? azaTHIOprine (IMURAN) tablet 25 mg  25 mg Oral Daily Rod Can, MD   25 mg at 09/20/21 0954  ? calcium carbonate (TUMS - dosed in mg elemental calcium) chewable tablet 200 mg of elemental calcium  1 tablet Oral Daily Kyle, Tyrone A, DO   200 mg of elemental calcium at 09/20/21 0957  ? calcium citrate (CALCITRATE - dosed in mg elemental calcium) tablet 200 mg of elemental calcium  200  mg of elemental calcium Oral BID Marylyn Ishihara, Tyrone A, DO   200 mg of elemental calcium at 09/20/21 0958  ? And  ? cholecalciferol (VITAMIN D3) tablet 200 Units  200 Units Oral BID Marylyn Ishihara, Tyrone A, DO   200 Units at 09/20/21 0955  ? diphenhydrAMINE (BENADRYL) 12.5 MG/5ML elixir 12.5-25 mg  12.5-25 mg Oral Q4H PRN Swinteck, Aaron Edelman, MD      ? docusate sodium (COLACE) capsule 100 mg  100 mg Oral BID Rod Can, MD   100 mg at 09/19/21 2256  ? feeding supplement (BOOST / RESOURCE BREEZE) liquid 1 Container  1 Container Oral BID BM Arrien, Jimmy Picket, MD      ? feeding supplement (ENSURE ENLIVE / ENSURE PLUS) liquid 237 mL  237 mL Oral BID BM Arrien, Jimmy Picket, MD      ? HYDROcodone-acetaminophen [facility]) 7.5-325 MG per tablet 1-2 tablet  1-2 tablet Oral Q4H PRN Rod Can, MD   1 tablet at 09/19/21 2329  ? HYDROcodone-acetaminophen (NORCO/VICODIN) 5-325 MG per tablet 1-2 tablet  1-2 tablet Oral Q6H PRN Marylyn Ishihara, Tyrone A, DO      ? HYDROcodone-acetaminophen (NORCO/VICODIN) 5-325 MG per tablet 1-2 tablet  1-2 tablet Oral Q4H PRN Swinteck, Aaron Edelman, MD      ? levETIRAcetam (KEPPRA) IVPB 500 mg/100 mL premix  500 mg Intravenous Q12H Donnetta Simpers, MD 400 mL/hr at 09/20/21 0953 500 mg at 09/20/21 0953  ? Or  ? levETIRAcetam (KEPPRA) tablet 500 mg  500 mg Oral Q12H Donnetta Simpers, MD      ? LORazepam (ATIVAN) injection 2 mg  2 mg Intravenous Q6H PRN Florencia Reasons, MD   2 mg at 09/19/21 0757  ? menthol-cetylpyridinium (CEPACOL) lozenge 3 mg  1 lozenge Oral PRN Swinteck, Aaron Edelman, MD      ? Or  ? phenol (CHLORASEPTIC) mouth spray 1 spray  1 spray Mouth/Throat PRN Swinteck, Aaron Edelman, MD      ? methocarbamol (ROBAXIN) tablet 500 mg  500 mg Oral Q6H PRN Rod Can, MD   500 mg at 09/20/21 1109  ? Or  ? methocarbamol (ROBAXIN) 500 mg in dextrose 5 % 50 mL IVPB  500 mg Intravenous Q6H PRN Rod Can, MD 100 mL/hr at 09/19/21 0314 500 mg at 09/19/21 0314  ? methocarbamol (ROBAXIN) tablet 500 mg  500 mg Oral Once Lovey Newcomer T, NP      ? metoCLOPramide (REGLAN) tablet 5-10 mg  5-10 mg Oral Q8H PRN Swinteck, Aaron Edelman, MD      ? Or  ? metoCLOPramide (REGLAN) injection 5-10 mg  5-10 mg Intravenous Q8H PRN Swinteck, Aaron Edelman, MD      ? metoprolol tartrate (LOPRESSOR) tablet 50 mg  50 mg Oral Daily Rod Can, MD   50 mg at 09/20/21 0959  ? morphine (PF) 2 MG/ML injection 0.5 mg  0.5 mg Intravenous Q2H PRN Marylyn Ishihara, Tyrone A, DO   0.5 mg at 09/18/21 2322  ? morphine (PF) 2 MG/ML injection 0.5-1 mg  0.5-1 mg Intravenous Q2H PRN  Rod Can, MD   1 mg at 09/19/21 1606  ? multivitamin with minerals tablet 1 tablet  1 tablet Oral Daily Rod Can, MD   1 tablet at 09/20/21 0959  ? ondansetron (ZOFRAN) tablet 4 mg  4 mg Oral Q6H PRN Swinteck, Aaron Edelman, MD      ? Or  ? ondansetron (ZOFRAN) injection 4 mg  4 mg Intravenous Q6H PRN Rod Can, MD   4 mg at 09/18/21 2101  ? pantoprazole (PROTONIX) EC tablet 80 mg  80 mg Oral Daily Rod Can, MD   80 mg at 09/20/21 1011  ? polyethylene glycol (MIRALAX / GLYCOLAX) packet 17 g  17 g Oral Daily PRN Swinteck, Aaron Edelman, MD      ? predniSONE (DELTASONE) tablet 7.5 mg  7.5 mg Oral Daily Rod Can, MD   7.5 mg at 09/20/21 [id number]  ? senna (SENOKOT) tablet 8.6 mg  1 tablet Oral BID Rod Can, MD   8.6 mg at 09/20/21 0959  ? vitamin B-12 (CYANOCOBALAMIN) tablet 100 mcg  100 mcg Oral Daily Kyle, Tyrone A, DO   100 mcg at 09/20/21 [id number]  ? ? ? ?Discharge Medications: ?Please see discharge summary for a list of discharge medications. ? ?Relevant Imaging Results: ? ?Relevant Lab Results: ? ? ?Additional Information ?[id number] ? ?Shamonica Schadt, RN ? ? ? ? ?

## 2021-09-20 NOTE — Progress Notes (Signed)
?  Progress Note ? ? ?Patient: RORIE YUEN [id number] DOB: 1945-07-29 DOA: 09/18/2021     2 ?DOS: the patient was seen and examined on 09/20/2021 ?  ?Brief hospital course: ?Mrs. Meenach was admitted to the hospital with the working diagnosis of right periprosthetic femur fracture.  ? ?76 yo female with the past medical history of autoimmune hepatitis, hypertension, and chronic kidney disease who presented after a mechanical fall. She had a recent right hip repair and discharge to SNF. After 2 days of being at home she lost her balance and fell to her right side. On her initial physical examination her blood pressure was 137/86, HR 92, RR 14 and oxygen saturation 93%. Lungs clear to auscultation, heart with S1 and S2 present and rhythmic, abdomen soft and no lower extremity edema.  ? ?Pelvic radiograph with oblique periprosthetic fracture of the left intertrochanteric region of femur extending into the proximal diaphysis with 7 mm of medial displacement.  ? ?Patient was admitted to the medical ward, was placed on analgesics and DVT prophylaxis. ?Orthopedics was consulted. ? ?03/04 underwent revision component right hip, conversion of previous total hip arthroplasty.  ? ?03/05 patient had a witness seizure and neurology was consulted. Tonic clonic movements, for 2 minutes, followed by post ictal state.  ? ? ? ?Assessment and Plan: ?Seizure ([place]) ?Patient with no further seizure activity. ?Continue with keppra at 500 mg bid ?Neuro checks per unit protocol and follow up with neurology recommendations.  ?Follow up with speech therapy evaluation.  ? ?Stage 3a chronic kidney disease (CKD) ([place]) ?Renal function sable with serum cr at 0,87, K is 3,7 and serum bicarbonate at 20. ?Continue close follow up on renal function and electrolytes.  ? ?Hypertension ?Continue blood pressure control with metoprolol.  ? ?Autoimmune hepatitis ([place]) ?Continue with prednisone and azathioprine.  ? ?Normocytic anemia ?Cell count has been  stable.  ?Continue close follow up, no current indication for PRBC transfusion.  ? ?Pressure injury of skin ?Continue local care  ? ? ? ? ?  ? ?Subjective: patient with no further seizures, post operative pain is controlled.  ? ?Physical Exam: ?Vitals:  ? 09/19/21 2344 09/20/21 0549 09/20/21 0953 09/20/21 0959  ?BP: (!) 148/79 130/72 (!) 141/70   ?Pulse: 86 100  76  ?Resp: 20 18    ?Temp: 99.3 ?F (37.4 ?C) 98.4 ?F (36.9 ?C)    ?TempSrc: Oral     ?SpO2: 94% 94%    ? ?Neurology awake and alert ?ENT with mild pallor ?Cardiovascular with S1 and S2 present and rhythmic with no rubs or murmurs ?Respiratory with no wheezing or rales ?Abdomen soft and non tender  ?Data Reviewed: ? ? ? ?Family Communication: no family at the bedside  ? ?Disposition: ?Status is: Inpatient ?Remains inpatient appropriate because: pending transfer to snf  ? Planned Discharge Destination: Skilled nursing facility ? ? ? ?Author: ?Tawni Millers, MD ?09/20/2021 7:05 PM ? ?For on call review [email or web address].  ?

## 2021-09-20 NOTE — Assessment & Plan Note (Addendum)
-   Repeat Hgb in 1 month ?

## 2021-09-20 NOTE — Assessment & Plan Note (Addendum)
BP normal - Continue metoprolol 

## 2021-09-20 NOTE — Evaluation (Signed)
Physical Therapy Evaluation ?Patient Details ?Name: [name] [name] ?MRN: [id number] ?DOB: 02-04-1946 ?Today's Date: 09/20/2021 ? ?History of Present Illness ? 76 y.o. female with PMH significant for admission 08/19/21 with R hip fx s/p hemiarthroplasty (pt had DCed home from rehab 2 days prior to this admission),  autoimmune hepatitis, HTN, CKD3a, HTN. Pt now presenting with right hip pain after a fall and found to have periprosthetic intertrochanteric fracture of the right femur, s/p convertion to posterior THA 09/18/21. She had 3 episodes concerning for seizures.  ?Clinical Impression ? Pt admitted with above diagnosis. +2 total assist for supine to sit, +2 mod assist for bed to recliner transfer. Pt had difficulty weight shifting and taking steps. Stedy recommended for transfers.  Pt currently with functional limitations due to the deficits listed below (see PT Problem List). Pt will benefit from skilled PT to increase their independence and safety with mobility to allow discharge to the venue listed below.   ?   ?   ? ?Recommendations for follow up therapy are one component of a multi-disciplinary discharge planning process, led by the attending physician.  Recommendations may be updated based on patient status, additional functional criteria and insurance authorization. ? ?Follow Up Recommendations Skilled nursing-short term rehab (<3 hours/day) ? ?  ?Assistance Recommended at Discharge Frequent or constant Supervision/Assistance  ?Patient can return home with the following ? Two people to help with walking and/or transfers;Assistance with cooking/housework;Assist for transportation;Help with stairs or ramp for entrance;Two people to help with bathing/dressing/bathroom ? ?  ?Equipment Recommendations None recommended by PT  ?Recommendations for Other Services ?    ?  ?Functional Status Assessment Patient has had a recent decline in their functional status and demonstrates the ability to make significant improvements  in function in a reasonable and predictable amount of time.  ? ?  ?Precautions / Restrictions Precautions ?Precautions: Posterior Hip ?Precaution Booklet Issued: Yes (comment) ?Precaution Comments: instructed pt in posterior hip precautions ?Restrictions ?Weight Bearing Restrictions: No  ? ?  ? ?Mobility ? Bed Mobility ?Overal bed mobility: Needs Assistance ?Bed Mobility: Supine to Sit ?  ?  ?Supine to sit: +2 for physical assistance, Total assist ?  ?  ?General bed mobility comments: assist to raise trunk, pivot hips and advance BLEs ?  ? ?Transfers ?Overall transfer level: Needs assistance ?Equipment used: Rolling walker (2 wheels) ?Transfers: Sit to/from Stand, Bed to chair/wheelchair/BSC ?Sit to Stand: Mod assist, +2 physical assistance ?  ?Step pivot transfers: Mod assist, +2 safety/equipment ?  ?  ?  ?General transfer comment: assist to rise and steady, difficulty weight shifting to take pivotal steps to recliner, increased time/effort ?  ? ?Ambulation/Gait ?  ?  ?  ?  ?  ?  ?  ?  ? ?Stairs ?  ?  ?  ?  ?  ? ?Wheelchair Mobility ?  ? ?Modified Rankin (Stroke Patients Only) ?  ? ?  ? ?Balance Overall balance assessment: Needs assistance ?  ?Sitting balance-Leahy Scale: Fair ?  ?  ?Standing balance support: Bilateral upper extremity supported ?Standing balance-Leahy Scale: Poor ?Standing balance comment: relies on BUE support ?  ?  ?  ?  ?  ?  ?  ?  ?  ?  ?  ?   ? ? ? ?Pertinent Vitals/Pain Pain Assessment ?Faces Pain Scale: Hurts even more ?Pain Location: R hip with movement ?Pain Descriptors / Indicators: Grimacing, Guarding, Moaning ?Pain Intervention(s): Limited activity within patient's tolerance, Monitored during session, Premedicated before session,  Repositioned  ? ? ?Home Living Family/patient expects to be discharged to:: Private residence ?Living Arrangements: Spouse/significant other ?Available Help at Discharge: Family;Available 24 hours/day ?Type of Home: Mobile home ?Home Access: Stairs to  enter ?Entrance Stairs-Rails: Right;Left;Can reach both ?Entrance Stairs-Number of Steps: 5 ?  ?Home Layout: One level ?Home Equipment: Agricultural consultant (2 wheels);Cane - quad ?   ?  ?Prior Function Prior Level of Function : Independent/Modified Independent ?  ?  ?  ?  ?  ?  ?Mobility Comments: Pt reports she's ambulatory with QD ?ADLs Comments: indepedent in ADLs. still driving ?  ? ? ?Hand Dominance  ? Dominant Hand: Right ? ?  ?Extremity/Trunk Assessment  ? Upper Extremity Assessment ?Upper Extremity Assessment: Defer to OT evaluation ?  ? ?Lower Extremity Assessment ?Lower Extremity Assessment: RLE deficits/detail ?RLE Deficits / Details: knee ext at least 3/5 ?RLE: Unable to fully assess due to pain ?RLE Sensation: WNL ?  ? ?Cervical / Trunk Assessment ?Cervical / Trunk Assessment: Kyphotic  ?Communication  ? Communication: No difficulties  ?Cognition Arousal/Alertness: Awake/alert ?Behavior During Therapy: [facility] for tasks assessed/performed ?Overall Cognitive Status: No family/caregiver present to determine baseline cognitive functioning ?Area of Impairment: Memory, Problem solving, Safety/judgement ?  ?  ?  ?  ?  ?  ?  ?  ?  ?  ?Memory: Decreased short-term memory ?  ?  ?  ?Problem Solving: Slow processing, Requires verbal cues, Requires tactile cues ?  ?  ?  ? ?  ?General Comments   ? ?  ?Exercises General Exercises - Lower Extremity ?Ankle Circles/Pumps: Both, 5 reps, AROM, Supine ?Long Arc Quad: AAROM, Right, 5 reps, Seated ?Heel Slides: AAROM, Right, 5 reps, Supine ?Hip ABduction/ADduction: AAROM, Right, 5 reps, Supine  ? ?Assessment/Plan  ?  ?PT Assessment Patient needs continued PT services  ?PT Problem List Decreased strength;Decreased mobility;Decreased activity tolerance;Decreased balance;Pain ? ?   ?  ?PT Treatment Interventions Gait training;Therapeutic activities;Therapeutic exercise;Patient/family education;Functional mobility training   ? ?PT Goals (Current goals can be found in the Care Plan  section)  ?Acute Rehab PT Goals ?Patient Stated Goal: none stated ?PT Goal Formulation: Patient unable to participate in goal setting ?Time For Goal Achievement: 10/04/21 ?Potential to Achieve Goals: Fair ? ?  ?Frequency Min 3X/week ?  ? ? ?Co-evaluation   ?  ?  ?  ?  ? ? ?  ?AM-PAC PT "6 Clicks" Mobility  ?Outcome Measure Help needed turning from your back to your side while in a flat bed without using bedrails?: Total ?Help needed moving from lying on your back to sitting on the side of a flat bed without using bedrails?: Total ?Help needed moving to and from a bed to a chair (including a wheelchair)?: A Lot ?Help needed standing up from a chair using your arms (e.g., wheelchair or bedside chair)?: A Lot ?Help needed to walk in hospital room?: Total ?Help needed climbing 3-5 steps with a railing? : Total ?6 Click Score: 8 ? ?  ?End of Session Equipment Utilized During Treatment: Gait belt ?Activity Tolerance: Patient limited by pain ?Patient left: in chair;with call bell/phone within reach;with chair alarm set ?Nurse Communication: Mobility status;Need for lift equipment (recommended nursing use Stedy) ?PT Visit Diagnosis: Difficulty in walking, not elsewhere classified (R26.2);Pain;History of falling (Z91.81);Repeated falls (R29.6) ?Pain - Right/Left: Right ?Pain - part of body: Hip ?  ? ?Time: 2458-0998 ?PT Time Calculation (min) (ACUTE ONLY): 20 min ? ? ?Charges:   PT Evaluation ?$PT Eval Moderate Complexity:  1 Mod ?  ?  ?   ? ?Ralene Bathe Kistler PT 09/20/2021  ?Acute Rehabilitation Services ?Pager [phone] ?Office [phone] ? ? ?

## 2021-09-20 NOTE — Evaluation (Signed)
Clinical/Bedside Swallow Evaluation ?Patient Details  ?Name: [name] [name] ?MRN: [id number] ?Date of Birth: 05-08-1946 ? ?Today's Date: 09/20/2021 ?Time: SLP Start Time (ACUTE ONLY): 1235 SLP Stop Time (ACUTE ONLY): 1255 ?SLP Time Calculation (min) (ACUTE ONLY): 20 min ? ?Past Medical History:  ?Past Medical History:  ?Diagnosis Date  ? Abnormal LFTs (liver function tests)   ? Autoimmune hepatitis (HCC) 2018  ? Autoimmune hepatitis (HCC)   ? Autoimmune hepatitis treated with steroids (HCC)   ? DOE (dyspnea on exertion)   ? GERD (gastroesophageal reflux disease)   ? Hepatitis   ? Hyperlipidemia   ? Hypertension   ? Iron deficiency anemia due to chronic blood loss 05/27/2021  ? Osteoporosis   ? ?Past Surgical History:  ?Past Surgical History:  ?Procedure Laterality Date  ? ABDOMINAL HYSTERECTOMY    ? COLONOSCOPY  10/06/2015  ? COLONOSCOPY WITH PROPOFOL N/A 08/27/2018  ? Procedure: COLONOSCOPY WITH PROPOFOL;  Surgeon: Scot Jun, MD;  Location: [facility] ENDOSCOPY;  Service: Endoscopy;  Laterality: N/A;  ? ESOPHAGOGASTRODUODENOSCOPY (EGD) WITH PROPOFOL N/A 08/27/2018  ? Procedure: ESOPHAGOGASTRODUODENOSCOPY (EGD) WITH PROPOFOL;  Surgeon: Scot Jun, MD;  Location: [facility] ENDOSCOPY;  Service: Endoscopy;  Laterality: N/A;  ? ESOPHAGOGASTRODUODENOSCOPY (EGD) WITH PROPOFOL N/A 05/21/2021  ? Procedure: ESOPHAGOGASTRODUODENOSCOPY (EGD) WITH PROPOFOL;  Surgeon: Regis Bill, MD;  Location: [facility] ENDOSCOPY;  Service: Endoscopy;  Laterality: N/A;  ? HIP ARTHROPLASTY Right 08/20/2021  ? Procedure: ARTHROPLASTY BIPOLAR HIP (HEMIARTHROPLASTY);  Surgeon: Juanell Fairly, MD;  Location: [facility] ORS;  Service: Orthopedics;  Laterality: Right;  ? LIVER BIOPSY    ? ?HPI:  ?Patient is a 76 y.o. female with PMH: autoimmune hepatitis, HTN, CKD IIIa, HTN. She presented to hospital with right hip pain after a fall. She recently(08/20/21)  had right hip repair and f/u rehab, discharged home from rehab two days prior to current admission.  She had been ambulating in home with cane. on 3/3, she lost her balance while walking from her kitchen to the hallway and fell on her right side. (no head injury or LOC). She was unable to get up and called EMS. CT of right hip showed  periprosthetic right femur fracture and she underwent femoral component revision conversion to total hip arthroplasty (right hip). Speech swallow evaluation ordered secondary to concern of patient not masticating soft solids (noodles and chicken in soup).  ?  ?Assessment / Plan / Recommendation  ?Clinical Impression ? Patient presenting with questionable s/s of a reversible cognitive-based dysphagia. She accepted PO's of thin liquids (water) via straw sips, crushed meds in puree and gelatin. She did not present with any overt s/s aspiration or penetration during this evaluation but RN had report of concern from previous night when patient was not masticating or managing solids appropriately when eating chicken noodle soup. SLP did not observe patient to have any difficulty during oral phase with liquids or with solids (puree, gelatin). SLP plans to see patient at least one more time to ensure she is tolerating PO's but suspect she will improve as she returns to her cognitive baseline. ?  ?   ?Aspiration Risk ? No limitations;Mild aspiration risk  ?  ?Diet Recommendation Regular;Thin liquid  ? ?Liquid Administration via: Cup;Straw ?Medication Administration: Whole meds with puree ?Supervision: Patient able to self feed ?Compensations: Slow rate;Small sips/bites ?Postural Changes: Seated upright at 90 degrees  ?  ?Other  Recommendations Oral Care Recommendations: Oral care BID   ? ?Recommendations for follow up therapy are one component of a multi-disciplinary  discharge planning process, led by the attending physician.  Recommendations may be updated based on patient status, additional functional criteria and insurance authorization. ? ?Follow up Recommendations No SLP follow up  ? ? ?   ?Assistance Recommended at Discharge None  ?Functional Status Assessment Patient has had a recent decline in their functional status and demonstrates the ability to make significant improvements in function in a reasonable and predictable amount of time.  ?Frequency and Duration min 1 x/week  ?1 week ?  ?   ? ?Prognosis Prognosis for Safe Diet Advancement: Good  ? ?  ? ?Swallow Study   ?General Date of Onset: 09/19/21 ?HPI: Patient is a 76 y.o. female with PMH: autoimmune hepatitis, HTN, CKD IIIa, HTN. She presented to hospital with right hip pain after a fall. She recently(08/20/21)  had right hip repair and f/u rehab, discharged home from rehab two days prior to current admission. She had been ambulating in home with cane. on 3/3, she lost her balance while walking from her kitchen to the hallway and fell on her right side. (no head injury or LOC). She was unable to get up and called EMS. CT of right hip showed  periprosthetic right femur fracture and she underwent femoral component revision conversion to total hip arthroplasty (right hip). Speech swallow evaluation ordered secondary to concern of patient not masticating soft solids (noodles and chicken in soup). ?Type of Study: Bedside Swallow Evaluation ?Previous Swallow Assessment: none found ?Diet Prior to this Study: Regular;Thin liquids ?Temperature Spikes Noted: No ?Respiratory Status: Room air ?History of Recent Intubation: No ?Behavior/Cognition: Alert;Cooperative ?Oral Cavity Assessment: Within Functional Limits ?Oral Care Completed by SLP: No ?Oral Cavity - Dentition: Adequate natural dentition ?Vision: Functional for self-feeding ?Self-Feeding Abilities: Able to feed self ?Patient Positioning: Upright in chair ?Baseline Vocal Quality: Normal ?Volitional Cough: Cognitively unable to elicit ?Volitional Swallow: Able to elicit  ?  ?Oral/Motor/Sensory Function Overall Oral Motor/Sensory Function: Within functional limits   ?Ice Chips     ?Thin Liquid Thin  Liquid: Within functional limits ?Presentation: Straw  ?  ?Nectar Thick     ?Honey Thick     ?Puree     ?Solid ? ? ?  Solid: Within functional limits ?Presentation: Self Fed  ? ?  ?Angela Nevin, MA, CCC-SLP ?Speech Therapy ? ? ? ? ?

## 2021-09-20 NOTE — Assessment & Plan Note (Addendum)
Stage II sacrum ? ?

## 2021-09-20 NOTE — Hospital Course (Addendum)
[name] [name] is a 76 y.o. F with AIH, HTN and CKD who presented with a fall and left hip pain.   ? ?Had had a recent right hip repair, discharged to SNF. 2 days after returning home from SNF, she fell at home, could not stand.  ? ?In the ER, pelvic radiograph with oblique periprosthetic fracture of the left intertrochanteric region of femur extending into the proximal diaphysis with 7 mm of medial displacement.  ? ? ? ?3/3: Admitted, Orthopedics was consulted. ?3/4: underwent revision component right hip, conversion of previous total hip arthroplasty.  ?3/5: pt had witnessed seizure, neurology was consulted. Tonic clonic movements, for 2 minutes, followed by post ictal state ? ? ? ? ? ?

## 2021-09-20 NOTE — Evaluation (Signed)
Occupational Therapy Evaluation Patient Details Name: [name] [name] MRN: [id number] DOB: May 23, 1946 Today's Date: 09/20/2021   History of Present Illness 76 y.o. female with PMH significant for admission 08/19/21 with R hip fx s/p hemiarthroplasty (pt had DCed home from rehab 2 days prior to this admission),  autoimmune hepatitis, HTN, CKD3a, HTN. Pt now presenting with right hip pain after a fall and found to have periprosthetic intertrochanteric fracture of the right femur, s/p convertion to posterior THA 09/18/21. She had 3 episodes concerning for seizures.   Clinical Impression   Patient is a 76 year old female who was noted to have had a significant decline in the ability to participate in ADLs.  Patient was noted to have increased pain in RLE, decreased safety awareness, decreased endurance, decreased functional activity tolerance, decreased motor coordination, decreased strength impacting participation in ADLs. Patient would continue to benefit from skilled OT services at this time while admitted and after d/c to address noted deficits in order to improve overall safety and independence in ADLs.       Recommendations for follow up therapy are one component of a multi-disciplinary discharge planning process, led by the attending physician.  Recommendations may be updated based on patient status, additional functional criteria and insurance authorization.   Follow Up Recommendations  Skilled nursing-short term rehab (<3 hours/day)    Assistance Recommended at Discharge Frequent or constant Supervision/Assistance  Patient can return home with the following Two people to help with walking and/or transfers;Two people to help with bathing/dressing/bathroom;Direct supervision/assist for medications management;Help with stairs or ramp for entrance;Assist for transportation;Direct supervision/assist for financial management;Assistance with cooking/housework    Functional Status Assessment  Patient  has had a recent decline in their functional status and demonstrates the ability to make significant improvements in function in a reasonable and predictable amount of time.  Equipment Recommendations  Other (comment) (TBD)    Recommendations for Other Services       Precautions / Restrictions Precautions Precautions: Posterior Hip Precaution Booklet Issued: Yes (comment) Precaution Comments: instructed pt in posterior hip precautions Restrictions Weight Bearing Restrictions: No Other Position/Activity Restrictions: WBAT RLE      Mobility Bed Mobility Overal bed mobility: Needs Assistance Bed Mobility: Supine to Sit     Supine to sit: +2 for physical assistance, Total assist     General bed mobility comments: assist to raise trunk, pivot hips and advance BLEs    Transfers                          Balance Overall balance assessment: Needs assistance   Sitting balance-Leahy Scale: Fair     Standing balance support: Bilateral upper extremity supported, Reliant on assistive device for balance Standing balance-Leahy Scale: Poor                             ADL either performed or assessed with clinical judgement   ADL Overall ADL's : Needs assistance/impaired   Eating/Feeding Details (indicate cue type and reason): patient declined Grooming: Minimal assistance;Sitting Grooming Details (indicate cue type and reason): EOB Upper Body Bathing: Moderate assistance;Bed level   Lower Body Bathing: Bed level;Total assistance   Upper Body Dressing : Moderate assistance;Bed level   Lower Body Dressing: Bed level;Total assistance   Toilet Transfer: +2 for physical assistance;+2 for safety/equipment;Maximal assistance Toilet Transfer Details (indicate cue type and reason): patient transfered to recliner in room with increased cues  for pushing through arms and proper hand and foot placement. patient has no awareness of posterior hip precautions without  therpaist eduaction being provided. Toileting- Clothing Manipulation and Hygiene: Total assistance;Bed level       Functional mobility during ADLs: +2 for physical assistance;+2 for safety/equipment       Vision Patient Visual Report: No change from baseline       Perception     Praxis      Pertinent Vitals/Pain Pain Assessment Pain Assessment: 0-10 Pain Score: 8  Pain Location: patient reported R hand at rest but noted to have grimancing with movement of RLE Pain Descriptors / Indicators: Grimacing, Guarding, Moaning Pain Intervention(s): Limited activity within patient's tolerance, Monitored during session, Premedicated before session, Repositioned     Hand Dominance Right   Extremity/Trunk Assessment Upper Extremity Assessment Upper Extremity Assessment: RUE deficits/detail;LUE deficits/detail RUE Deficits / Details: patient was noted to become frustrated with questions on abilities and testing. patietn was noted to have slight decreased strength in this grip strength compared to L side strenght 4/5. patient noted to have poor ability to prefom Ok sign with this UE with some increased movement of digits noted with inabiltiy to oppose thumb with noted frustrations. LUE Deficits / Details: patient was noted to have edema in UE proximal to IV line. posterior upper arm noted to have increased fluid. nurse made aware.   Lower Extremity Assessment Lower Extremity Assessment: RLE deficits/detail RLE Deficits / Details: knee ext at least 3/5 RLE: Unable to fully assess due to pain RLE Sensation: WNL   Cervical / Trunk Assessment Cervical / Trunk Assessment: Kyphotic   Communication Communication Communication: No difficulties   Cognition Arousal/Alertness: Awake/alert Behavior During Therapy: WFL for tasks assessed/performed Overall Cognitive Status: No family/caregiver present to determine baseline cognitive functioning Area of Impairment: Memory, Problem solving,  Safety/judgement                     Memory: Decreased short-term memory       Problem Solving: Slow processing, Requires verbal cues, Requires tactile cues General Comments: patient was short with answers provided. patient at times appeared to have different levels of participation in tasks.     General Comments       Exercises     Shoulder Instructions      Home Living Family/patient expects to be discharged to:: Private residence Living Arrangements: Spouse/significant other Available Help at Discharge: Family;Available 24 hours/day Type of Home: Mobile home Home Access: Stairs to enter Entrance Stairs-Number of Steps: 5 Entrance Stairs-Rails: Right;Left;Can reach both Home Layout: One level     Bathroom Shower/Tub: Tub/shower unit         Home Equipment: Conservation officer, nature (2 wheels);Cane - quad          Prior Functioning/Environment Prior Level of Function : Independent/Modified Independent             Mobility Comments: Pt reports she's ambulatory with QD ADLs Comments: indepedent in ADLs. still driving        OT Problem List: Decreased activity tolerance;Impaired balance (sitting and/or standing);Decreased safety awareness;Decreased cognition;Cardiopulmonary status limiting activity;Decreased knowledge of precautions;Decreased knowledge of use of DME or AE;Increased edema      OT Treatment/Interventions: Self-care/ADL training;Therapeutic exercise;Neuromuscular education;DME and/or AE instruction;Energy conservation;Therapeutic activities;Balance training;Patient/family education    OT Goals(Current goals can be found in the care plan section) Acute Rehab OT Goals Patient Stated Goal: none stated OT Goal Formulation: With patient Time For Goal Achievement: 10/04/21 Potential  to Achieve Goals: Good  OT Frequency: Min 2X/week    Co-evaluation PT/OT/SLP Co-Evaluation/Treatment: Yes Reason for Co-Treatment: For patient/therapist safety;To  address functional/ADL transfers PT goals addressed during session: Mobility/safety with mobility OT goals addressed during session: ADL's and self-care      AM-PAC OT "6 Clicks" Daily Activity     Outcome Measure Help from another person eating meals?: A Little Help from another person taking care of personal grooming?: A Little Help from another person toileting, which includes using toliet, bedpan, or urinal?: Total Help from another person bathing (including washing, rinsing, drying)?: A Lot Help from another person to put on and taking off regular upper body clothing?: A Lot Help from another person to put on and taking off regular lower body clothing?: A Lot 6 Click Score: 13   End of Session Equipment Utilized During Treatment: Gait belt;Rolling walker (2 wheels) Nurse Communication: Mobility status;Other (comment) (edema noted in BUE)  Activity Tolerance: Patient tolerated treatment well Patient left: in chair;with call bell/phone within reach;with chair alarm set  OT Visit Diagnosis: Unsteadiness on feet (R26.81);Other abnormalities of gait and mobility (R26.89);Muscle weakness (generalized) (M62.81);History of falling (Z91.81);Pain                Time: [id number] OT Time Calculation (min): 19 min Charges:  OT General Charges $OT Visit: 1 Visit OT Evaluation $OT Eval Moderate Complexity: 1 Mod  Jackelyn Poling OTR/L, MS Acute Rehabilitation Department Office# [phone] Pager# [phone]   Marcellina Millin 09/20/2021, 1:16 PM

## 2021-09-20 NOTE — Progress Notes (Signed)
EEG complete - results pending 

## 2021-09-20 NOTE — NC FL2 (Signed)
?[place] MEDICAID FL2 LEVEL OF CARE SCREENING TOOL  ?  ? ?IDENTIFICATION  ?Patient Name: ?[name] [name] Birthdate: Mar 09, 1946 Sex: female Admission Date (Current Location): ?09/18/2021  ?[place] and [place] Number: ? [place] ?  Facility and Address:  ?[facility],  [address], [place] ?     Provider Number: ?[id number]  ?Attending Physician Name and Address:  ?Arrien, Jimmy Picket,* ? Relative Name and Phone Number:  ?Matchett,Cornelius Spouse [phone]   [phone] ?   ?Current Level of Care: ?Hospital Recommended Level of Care: ?[place] Prior Approval Number: ?  ? ?Date Approved/Denied: ?  PASRR Number: ?[id number] ? ?Discharge Plan: ?SNF ?  ? ?Current Diagnoses: ?Patient Active Problem List  ? Diagnosis Date Noted  ? Pressure injury of skin 09/19/2021  ? Periprosthetic intertrochanteric fracture of femur 09/18/2021  ? Closed right hip fracture ([place]) 09/18/2021  ? Normocytic anemia 09/18/2021  ? Acute urinary retention 08/22/2021  ? Acute postoperative anemia due to expected blood loss 08/22/2021  ? Thrombocytopenia ([place]) 08/22/2021  ? Acute kidney injury superimposed on CKD ([place]) 08/21/2021  ? Toenail fungus 08/21/2021  ? Hyponatremia 08/21/2021  ? Nausea 08/20/2021  ? Hypertension   ? GERD (gastroesophageal reflux disease)   ? Stage 3a chronic kidney disease (CKD) ([place])   ? Closed hip fracture requiring operative repair, right, sequela 08/19/2021  ? Autoimmune hepatitis ([place]) 08/19/2021  ? Iron deficiency anemia due to chronic blood loss 05/27/2021  ? Hyperparathyroidism due to renal insufficiency ([place]) 07/08/2019  ? Erosive gastritis 12/25/2018  ? Abnormal LFTs 02/20/2017  ? Senile osteoporosis 02/13/2017  ? ? ?Orientation RESPIRATION BLADDER Height & Weight   ?  ?Self, Situation, Place ? Normal External catheter Weight:   ?Height:     ?BEHAVIORAL SYMPTOMS/MOOD NEUROLOGICAL BOWEL NUTRITION STATUS  ?    Incontinent Diet (Regular)   ?AMBULATORY STATUS COMMUNICATION OF NEEDS Skin   ?Extensive Assist Verbally PU Stage and Appropriate Care ?  ?PU Stage 2 Dressing: Daily (Ecchymosis Bilateral arms, Rt hip incision-sier hydrog=fiber, sacrum stage 2) ?  ?    ?     ?     ? ? ?Personal Care Assistance Level of Assistance  ?Bathing, Feeding, Dressing Bathing Assistance: Maximum assistance ?Feeding assistance: Limited assistance ?Dressing Assistance: Maximum assistance ?   ? ?Functional Limitations Info  ?Sight, Hearing, Speech Sight Info: Adequate ?Hearing Info: Adequate ?Speech Info: Adequate  ? ? ?SPECIAL CARE FACTORS FREQUENCY  ?PT (By licensed PT), OT (By licensed OT)   ?  ?PT Frequency: x5 week ?OT Frequency: x5 week ?  ?  ?  ?   ? ? ?Contractures Contractures Info: Not present  ? ? ?Additional Factors Info  ?Code Status, Allergies Code Status Info: DNR ?Allergies Info: Sulfa Antibiotics, Sulfasalazine ?  ?  ?  ?   ? ?Current Medications (09/20/2021):  This is the current hospital active medication list ?Current Facility-Administered Medications  ?Medication Dose Route Frequency Provider Last Rate Last Admin  ? 0.9 %  sodium chloride infusion   Intravenous Continuous Florencia Reasons, MD 100 mL/hr at 09/20/21 1233 New Bag at 09/20/21 1233  ? acetaminophen (TYLENOL) tablet 325-650 mg  325-650 mg Oral Q6H PRN Rod Can, MD   650 mg at 09/20/21 1108  ? alum & mag hydroxide-simeth (MAALOX/MYLANTA) 200-200-20 MG/5ML suspension 30 mL  30 mL Oral Q4H PRN Swinteck, Aaron Edelman, MD      ? aspirin chewable tablet 81 mg  81 mg Oral BID Rod Can, MD  81 mg at 09/20/21 0957  ? azaTHIOprine (IMURAN) tablet 25 mg  25 mg Oral Daily Rod Can, MD   25 mg at 09/20/21 0954  ? calcium carbonate (TUMS - dosed in mg elemental calcium) chewable tablet 200 mg of elemental calcium  1 tablet Oral Daily Kyle, Tyrone A, DO   200 mg of elemental calcium at 09/20/21 0957  ? calcium citrate (CALCITRATE - dosed in mg elemental calcium) tablet 200 mg of elemental calcium  200  mg of elemental calcium Oral BID Marylyn Ishihara, Tyrone A, DO   200 mg of elemental calcium at 09/20/21 0958  ? And  ? cholecalciferol (VITAMIN D3) tablet 200 Units  200 Units Oral BID Marylyn Ishihara, Tyrone A, DO   200 Units at 09/20/21 0955  ? diphenhydrAMINE (BENADRYL) 12.5 MG/5ML elixir 12.5-25 mg  12.5-25 mg Oral Q4H PRN Swinteck, Aaron Edelman, MD      ? docusate sodium (COLACE) capsule 100 mg  100 mg Oral BID Rod Can, MD   100 mg at 09/19/21 2256  ? feeding supplement (BOOST / RESOURCE BREEZE) liquid 1 Container  1 Container Oral BID BM Arrien, Jimmy Picket, MD      ? feeding supplement (ENSURE ENLIVE / ENSURE PLUS) liquid 237 mL  237 mL Oral BID BM Arrien, Jimmy Picket, MD      ? HYDROcodone-acetaminophen [facility]) 7.5-325 MG per tablet 1-2 tablet  1-2 tablet Oral Q4H PRN Rod Can, MD   1 tablet at 09/19/21 2329  ? HYDROcodone-acetaminophen (NORCO/VICODIN) 5-325 MG per tablet 1-2 tablet  1-2 tablet Oral Q6H PRN Marylyn Ishihara, Tyrone A, DO      ? HYDROcodone-acetaminophen (NORCO/VICODIN) 5-325 MG per tablet 1-2 tablet  1-2 tablet Oral Q4H PRN Swinteck, Aaron Edelman, MD      ? levETIRAcetam (KEPPRA) IVPB 500 mg/100 mL premix  500 mg Intravenous Q12H Donnetta Simpers, MD 400 mL/hr at 09/20/21 0953 500 mg at 09/20/21 0953  ? Or  ? levETIRAcetam (KEPPRA) tablet 500 mg  500 mg Oral Q12H Donnetta Simpers, MD      ? LORazepam (ATIVAN) injection 2 mg  2 mg Intravenous Q6H PRN Florencia Reasons, MD   2 mg at 09/19/21 0757  ? menthol-cetylpyridinium (CEPACOL) lozenge 3 mg  1 lozenge Oral PRN Swinteck, Aaron Edelman, MD      ? Or  ? phenol (CHLORASEPTIC) mouth spray 1 spray  1 spray Mouth/Throat PRN Swinteck, Aaron Edelman, MD      ? methocarbamol (ROBAXIN) tablet 500 mg  500 mg Oral Q6H PRN Rod Can, MD   500 mg at 09/20/21 1109  ? Or  ? methocarbamol (ROBAXIN) 500 mg in dextrose 5 % 50 mL IVPB  500 mg Intravenous Q6H PRN Rod Can, MD 100 mL/hr at 09/19/21 0314 500 mg at 09/19/21 0314  ? methocarbamol (ROBAXIN) tablet 500 mg  500 mg Oral Once Lovey Newcomer T, NP      ? metoCLOPramide (REGLAN) tablet 5-10 mg  5-10 mg Oral Q8H PRN Swinteck, Aaron Edelman, MD      ? Or  ? metoCLOPramide (REGLAN) injection 5-10 mg  5-10 mg Intravenous Q8H PRN Swinteck, Aaron Edelman, MD      ? metoprolol tartrate (LOPRESSOR) tablet 50 mg  50 mg Oral Daily Rod Can, MD   50 mg at 09/20/21 0959  ? morphine (PF) 2 MG/ML injection 0.5 mg  0.5 mg Intravenous Q2H PRN Marylyn Ishihara, Tyrone A, DO   0.5 mg at 09/18/21 2322  ? morphine (PF) 2 MG/ML injection 0.5-1 mg  0.5-1 mg Intravenous Q2H PRN  Rod Can, MD   1 mg at 09/19/21 1606  ? multivitamin with minerals tablet 1 tablet  1 tablet Oral Daily Rod Can, MD   1 tablet at 09/20/21 0959  ? ondansetron (ZOFRAN) tablet 4 mg  4 mg Oral Q6H PRN Swinteck, Aaron Edelman, MD      ? Or  ? ondansetron (ZOFRAN) injection 4 mg  4 mg Intravenous Q6H PRN Rod Can, MD   4 mg at 09/18/21 2101  ? pantoprazole (PROTONIX) EC tablet 80 mg  80 mg Oral Daily Rod Can, MD   80 mg at 09/20/21 1011  ? polyethylene glycol (MIRALAX / GLYCOLAX) packet 17 g  17 g Oral Daily PRN Swinteck, Aaron Edelman, MD      ? predniSONE (DELTASONE) tablet 7.5 mg  7.5 mg Oral Daily Rod Can, MD   7.5 mg at 09/20/21 [id number]  ? senna (SENOKOT) tablet 8.6 mg  1 tablet Oral BID Rod Can, MD   8.6 mg at 09/20/21 0959  ? vitamin B-12 (CYANOCOBALAMIN) tablet 100 mcg  100 mcg Oral Daily Kyle, Tyrone A, DO   100 mcg at 09/20/21 [id number]  ? ? ? ?Discharge Medications: ?Please see discharge summary for a list of discharge medications. ? ?Relevant Imaging Results: ? ?Relevant Lab Results: ? ? ?Additional Information ?[id number] ? ?Cambreigh Dearing, RN ? ? ? ? ?

## 2021-09-20 NOTE — Progress Notes (Signed)
Initial Nutrition Assessment ? ?DOCUMENTATION CODES:  ? ?Not applicable ? ?INTERVENTION:  ?- continue Ensure Plus High Protein BID, each supplement provides 350 kcal and 20 grams of protein. ?- will order Boost Breeze BID, each supplement provides 250 kcal and 9 grams of protein. ?- complete NFPE when feasible.  ? ? ?NUTRITION DIAGNOSIS:  ? ?Increased nutrient needs related to acute illness, post-op healing as evidenced by estimated needs ? ?GOAL:  ? ?Patient will meet greater than or equal to 90% of their needs ? ?MONITOR:  ? ?PO intake, Supplement acceptance, Labs, Weight trends ? ?REASON FOR ASSESSMENT:  ? ?Consult ?Hip fracture protocol ? ?ASSESSMENT:  ? ?76 y.o. female with medical history of autoimmune hepatitis, HTN, stage 3 CKD, osteoporosis, HLD, GERD, and iron deficiency anemia. She presented to the ED after a fall and was found to have a R periprosthetic femur fracture. ? ?Patient sitting in the chair with no visitors present at the time of RD visit. A cup of water and empty cup of jello was on bedside table in front of her.  ? ?Patient disgruntled throughout RD visit so visit was brief. She denied having anything else to eat or drink today aside from the two items in front of her.  ? ?She denied any changes in appetite or PO intakes PTA. She denied any abdominal pain or nausea today.  ? ?Weight on 3/3 was 130 lb, weight on 08/19/21 was 127 lb, and weight on 07/27/21 was 135 lb. This indicates 5 lb weight loss (3.7% body weight) in the past 2 months; not significant for time frame.  ? ?SLP saw patient this afternoon after RD visit and recommends current diet order of Regular, thin liquids.  ? ?Per notes: ?- POD #2 revision and conversion of previous R hip prosthesis to R total hip arthroplasty ?- concern for seizures--Neuro following and recommending MRI brain and EEG ?- IV fluids started d/t concern for dehydration with now resolved hyponatremia ?- impaired fasting glucose thought to be 2/2 stress and  steroid use ? ? ?Labs reviewed; Ca: 8 mg/dl. ? ?Medications reviewed; 1 tablet tums/day, 1 tablet calcitrate BID, 200 units cholecalciferol BID, 100 mg colace BID, 1 tablet multivitamin with minerals/day, 80 mg oral protonix/day, 7.5 mg deltasone/day, 1 tablet senokot BID, 100 mcg oral cyanocobalamin/day. ? ?IVF; NS @ 100 ml/hr. ?  ? ?NUTRITION - FOCUSED PHYSICAL EXAM: ? ?Deferred. ? ?Diet Order:   ?Diet Order   ? ?       ?  Diet regular Room service appropriate? Yes; Fluid consistency: Thin  Diet effective now       ?  ? ?  ?  ? ?  ? ? ?EDUCATION NEEDS:  ? ?Not appropriate for education at this time ? ?Skin:  Skin Assessment: Reviewed RN Assessment ? ?Last BM:  3/5 (smear) ? ?Height:  ? ?Ht Readings from Last 1 Encounters:  ?09/17/21 5\' 4"  (1.626 m)  ? ? ?Weight:  ? ?Wt Readings from Last 1 Encounters:  ?09/17/21 59 kg  ? ? ?Estimated Nutritional Needs:  ?Kcal:  1650-1850 kcal ?Protein:  75-90 grams ?Fluid:  >/= 1.7 L/day ? ? ? ? ? ?Jarome Matin, MS, RD, LDN ?Inpatient Clinical Dietitian ?RD pager # available in [place]  ?After hours/weekend pager # available in [place] ? ?

## 2021-09-20 NOTE — Assessment & Plan Note (Addendum)
Patient had witnessed seizure on 3/5, neurology was consulted. Tonic clonic movements, for 2 minutes, followed by post ictal state ? ?Initially placed on Keppra for seizure prophylaxis. Work up with brain and cervical spine MRI with no acute changes, EEG with no active seizures.  AEDs discontinued.    ?- Follow up as outpatient with neurology.  ?

## 2021-09-21 DIAGNOSIS — K754 Autoimmune hepatitis: Secondary | ICD-10-CM | POA: Diagnosis not present

## 2021-09-21 DIAGNOSIS — I10 Essential (primary) hypertension: Secondary | ICD-10-CM | POA: Diagnosis not present

## 2021-09-21 DIAGNOSIS — R569 Unspecified convulsions: Secondary | ICD-10-CM | POA: Diagnosis not present

## 2021-09-21 DIAGNOSIS — M978XXA Periprosthetic fracture around other internal prosthetic joint, initial encounter: Secondary | ICD-10-CM | POA: Diagnosis not present

## 2021-09-21 LAB — CBC
HCT: 22.6 % — ABNORMAL LOW (ref 36.0–46.0)
Hemoglobin: 7.1 g/dL — ABNORMAL LOW (ref 12.0–15.0)
MCH: 28.5 pg (ref 26.0–34.0)
MCHC: 31.4 g/dL (ref 30.0–36.0)
MCV: 90.8 fL (ref 80.0–100.0)
Platelets: 122 10*3/uL — ABNORMAL LOW (ref 150–400)
RBC: 2.49 MIL/uL — ABNORMAL LOW (ref 3.87–5.11)
RDW: 17.6 % — ABNORMAL HIGH (ref 11.5–15.5)
WBC: 7.6 10*3/uL (ref 4.0–10.5)
nRBC: 0 % (ref 0.0–0.2)

## 2021-09-21 NOTE — Plan of Care (Signed)
Pt resting much of day. Pt A&O x4. Pt q2 turns. Pt declined all meals and nutritional supplements. Pt agreeable to some water at times. ? ? ?Problem: Education: ?Goal: Knowledge of General Education information will improve ?Description: Including pain rating scale, medication(s)/side effects and non-pharmacologic comfort measures ?Outcome: Progressing ?  ?Problem: Health Behavior/Discharge Planning: ?Goal: Ability to manage health-related needs will improve ?Outcome: Progressing ?  ?Problem: Clinical Measurements: ?Goal: Ability to maintain clinical measurements within normal limits will improve ?Outcome: Progressing ?Goal: Will remain free from infection ?Outcome: Progressing ?Goal: Diagnostic test results will improve ?Outcome: Progressing ?Goal: Respiratory complications will improve ?Outcome: Progressing ?Goal: Cardiovascular complication will be avoided ?Outcome: Progressing ?  ?Problem: Activity: ?Goal: Risk for activity intolerance will decrease ?Outcome: Progressing ?  ?Problem: Nutrition: ?Goal: Adequate nutrition will be maintained ?Outcome: Progressing ?  ?Problem: Coping: ?Goal: Level of anxiety will decrease ?Outcome: Progressing ?  ?Problem: Elimination: ?Goal: Will not experience complications related to bowel motility ?Outcome: Progressing ?Goal: Will not experience complications related to urinary retention ?Outcome: Progressing ?  ?Problem: Pain Managment: ?Goal: General experience of comfort will improve ?Outcome: Progressing ?  ?Problem: Safety: ?Goal: Ability to remain free from injury will improve ?Outcome: Progressing ?  ?Problem: Skin Integrity: ?Goal: Risk for impaired skin integrity will decrease ?Outcome: Progressing ?  ?Problem: Education: ?Goal: Knowledge of the prescribed therapeutic regimen will improve ?Outcome: Progressing ?Goal: Understanding of discharge needs will improve ?Outcome: Progressing ?Goal: Individualized Educational Video(s) ?Outcome: Progressing ?  ?Problem:  Activity: ?Goal: Ability to avoid complications of mobility impairment will improve ?Outcome: Progressing ?Goal: Ability to tolerate increased activity will improve ?Outcome: Progressing ?  ?Problem: Clinical Measurements: ?Goal: Postoperative complications will be avoided or minimized ?Outcome: Progressing ?  ?Problem: Pain Management: ?Goal: Pain level will decrease with appropriate interventions ?Outcome: Progressing ?  ?Problem: Skin Integrity: ?Goal: Will show signs of wound healing ?Outcome: Progressing ?  ?

## 2021-09-21 NOTE — Progress Notes (Addendum)
Chart review note ? ?MRI brain-a lot of motion artifact-within limitation, no acute or subacute infarct nor seizure related restricted diffusion. ?MRI of the cervical spine cervical-nondiagnostic due to motion.  No significant listhesis seen.  Repeat when able to-can be outpatient. ?EEG with generalized continuous slowing ?Spoke with the hospitalist-mentation improving. ?No further seizure activity reported. ? ?Recommendations ?- Per Dr. Tollie Eth recs, no need for Keppra since EEG and MRI unremarkable. ?- At this point, would discontinue Keppra, observe for another night and if no seizures, no further neurological input inpatient. ?- Can follow-up with outpatient neurology 8 to 12 weeks after discharge. ?Plan relayed to Dr. Ella Jubilee ? ?-- ?Milon Dikes, MD ?Neurologist ?[facility] ?Pager: [phone] ? ?

## 2021-09-21 NOTE — Procedures (Signed)
Patient Name: [name] [name]  ?MRN: [id number]  ?Epilepsy Attending: Charlsie Quest  ?Referring Physician/Provider: Albertine Grates, MD ?Date: 09/21/2021 ?Duration: 21.51 mins ? ?Patient history: 76 y.o. female with PMH significant for  autoimmune hepatitis, HTN, CKD3a, HTN. Presenting with right hip pain after a fall and found to have Periprosthetic intertrochanteric fracture of the right femur. She had 3 episodes concerning for seizures. The description seems concerning for a epileptic seizure with L arm jerking, back arching and entire body jerking and eyes rolled in the back of her head. EEG to evaluate for seizure ? ?Level of alertness: Awake, asleep ? ?AEDs during EEG study: LEV ? ?Technical aspects: This EEG study was done with scalp electrodes positioned according to the 10-20 International system of electrode placement. Electrical activity was acquired at a sampling rate of 500Hz  and reviewed with a high frequency filter of 70Hz  and a low frequency filter of 1Hz . EEG data were recorded continuously and digitally stored.  ? ?Description: The posterior dominant rhythm consists of 7.5 Hz activity of moderate voltage (25-35 uV) seen predominantly in posterior head regions, symmetric and reactive to eye opening and eye closing. Sleep was characterized by vertex waves, sleep spindles (12 to 14 Hz), maximal frontocentral region. EEG showed continuous generalized 3 to 6 Hz theta-delta slowing. Hyperventilation and photic stimulation were not performed.    ? ?ABNORMALITY ?- Continuous slow, generalized ? ?IMPRESSION: ?This study is suggestive of moderate diffuse encephalopathy, nonspecific etiology. No seizures or epileptiform discharges were seen throughout the recording. ? ?  ? ?

## 2021-09-21 NOTE — Progress Notes (Addendum)
?Progress Note ? ? ?Patient: [name] [name] [id number] DOB: [birth year] DOA: 09/18/2021     3 ?DOS: the patient was seen and examined on 09/21/2021 ?  ?Brief hospital course: ?Mrs. Lunn was admitted to the hospital with the working diagnosis of right periprosthetic femur fracture.  ? ?76 yo female with the past medical history of autoimmune hepatitis, hypertension, and chronic kidney disease who presented after a mechanical fall. She had a recent right hip repair and discharge to SNF. After 2 days of being at home she lost her balance and fell to her right side. On her initial physical examination her blood pressure was 137/86, HR 92, RR 14 and oxygen saturation 93%. Lungs clear to auscultation, heart with S1 and S2 present and rhythmic, abdomen soft and no lower extremity edema.  ? ?Pelvic radiograph with oblique periprosthetic fracture of the left intertrochanteric region of femur extending into the proximal diaphysis with 7 mm of medial displacement.  ? ?Patient was admitted to the medical ward, was placed on analgesics and DVT prophylaxis. ?Orthopedics was consulted. ? ?03/04 underwent revision component right hip, conversion of previous total hip arthroplasty.  ? ?03/05 patient had a witness seizure and neurology was consulted. Tonic clonic movements, for 2 minutes, followed by post ictal state. ? ?Initially placed on Keppra for seizure prophylaxis. Work up with brain and cervical spine MRI with acute changes, EEG with no active seizures. ?Keppra has been discontinued.  ? ?Plan to transfer to SNF.   ? ? ? ?Assessment and Plan: ?* Periprosthetic intertrochanteric fracture of femur ?Patient sp revision femoral component right hip and conversion of prior surgery to a total hip arthoplasty.  ? ?Patient has been working with physical therapy and occupational therapy. ?Her post operative pain is well controlled. ? ?Plan to transfer to SNF.  ? ?Seizure ([place]) ?No further myoclonus or seizure activity. ?EEG with no  active seizures. ?Neurology has recommended to discontinue Keppra and continue neuro checks for 24 hrs. ? ?Brain and cervical MRI with no acute changes, limited study due to motion artifact.  ? ?Follow up as outpatient with neurology.  ? ?Stage 3a chronic kidney disease (CKD) ([place]) ?Renal function sable with serum cr at 0,87, K is 3,7 and serum bicarbonate at 20. ?Continue close follow up on renal function and electrolytes.  ? ?Hypertension ?Continue blood pressure control with metoprolol.  ? ?Autoimmune hepatitis ([place]) ?Continue with prednisone and azathioprine.  ? ?Normocytic anemia ?Cell count with low hgb down to 7,1 and hct at 22. ?Post operative anemia.  ? ?Plan to check H&H in am, if continue to be low will plan for PRBC transfusion.  ? ?Pressure injury of skin ?Continue local care  ?Pressure Injury 09/19/21 Sacrum Mid Stage 2 -  Partial thickness loss of dermis presenting as a shallow open injury with a red, pink wound bed without slough. (Active)  ?09/19/21 1600  ?Location: Sacrum  ?Location Orientation: Mid  ?Staging: Stage 2 -  Partial thickness loss of dermis presenting as a shallow open injury with a red, pink wound bed without slough.  ?Wound Description (Comments):   ?Present on Admission: Yes  ? ? ? ? ? ?  ? ?Subjective: Patient feeling well, no confusion, no seizures  ? ?Physical Exam: ?Vitals:  ? 09/21/21 0450 09/21/21 0912 09/21/21 1000 09/21/21 1227  ?BP: (!) 147/73 (!) 146/93  129/75  ?Pulse: 84 97  66  ?Resp: 17   18  ?Temp: 97.8 ?F (36.6 ?C)   97.8 ?F (36.6 ?C)  ?  TempSrc: Oral   Oral  ?SpO2: 96%   96%  ?Height:   5\' 4"  (1.626 m)   ? ?Neurology awake and alert, non focal  ?ENT with no pallor ?Cardiovascular with S1 and S2 present and rhythmic, with no murmurs ?Respiratory with no wheezing ?Abdomen soft ?No lower extremity edema  ?Data Reviewed: ? ? ? ?Family Communication: I spoke with patient's son and husband at the bedside, we talked in detail about patient's condition, plan of care and  prognosis and all questions were addressed. ?  ? ?Disposition: ?Status is: Inpatient ?Remains inpatient appropriate because: neuro monitoring for today then she can be transferred to SNF  ? Planned Discharge Destination: Skilled nursing facility ? ? ? ?Author: ?Tawni Millers, MD ?09/21/2021 4:29 PM ? ?For on call review [email or web address].  ?

## 2021-09-21 NOTE — Assessment & Plan Note (Addendum)
Per orthopedics 

## 2021-09-22 DIAGNOSIS — D649 Anemia, unspecified: Secondary | ICD-10-CM

## 2021-09-22 LAB — BPAM RBC
Blood Product Expiration Date: 202303182359
Blood Product Expiration Date: 202303252359
Blood Product Expiration Date: 202303252359
ISSUE DATE / TIME: 202303041643
ISSUE DATE / TIME: 202303041643
ISSUE DATE / TIME: 202303041647
Unit Type and Rh: 6200
Unit Type and Rh: 6200
Unit Type and Rh: 6200

## 2021-09-22 LAB — TYPE AND SCREEN
ABO/RH(D): A POS
Antibody Screen: NEGATIVE
Unit division: 0
Unit division: 0
Unit division: 0

## 2021-09-22 LAB — HEMOGLOBIN AND HEMATOCRIT, BLOOD
HCT: 24.6 % — ABNORMAL LOW (ref 36.0–46.0)
Hemoglobin: 7.9 g/dL — ABNORMAL LOW (ref 12.0–15.0)

## 2021-09-22 NOTE — Progress Notes (Signed)
Physical Therapy Treatment ?Patient Details ?Name: [name] [name] ?MRN: [id number] ?DOB: Jun 25, 1946 ?Today's Date: 09/22/2021 ? ? ?History of Present Illness 76 y.o. female with PMH significant for admission 08/19/21 with R hip fx s/p hemiarthroplasty (pt had DCed home from rehab 2 days prior to this admission),  autoimmune hepatitis, HTN, CKD3a, HTN. Pt now presenting with right hip pain after a fall and found to have periprosthetic intertrochanteric fracture of the right femur, s/p convertion to posterior THA 09/18/21. She had 3 episodes concerning for seizures. ? ?  ?PT Comments  ? ? Pt reports pain 7/10 in right hip. Pt was agreeable to bed exercises but declined sitting EOB and standing with assistance. Pt required assistance with bilateral LE exercises. Educated pt on posterior hip precautions and a sign is posted on the door. Pt verbalized she is agreeable to d/c to SNF for therapy.Pt will continue to benefit from continued acute skilled PT to maximize mobility and independence for d/c to the next venue of care.  ?Recommendations for follow up therapy are one component of a multi-disciplinary discharge planning process, led by the attending physician.  Recommendations may be updated based on patient status, additional functional criteria and insurance authorization. ? ?Follow Up Recommendations ? Skilled nursing-short term rehab (<3 hours/day) ?  ?  ?Assistance Recommended at Discharge Frequent or constant Supervision/Assistance  ?Patient can return home with the following Two people to help with walking and/or transfers;Assistance with cooking/housework;Assist for transportation;Help with stairs or ramp for entrance;Two people to help with bathing/dressing/bathroom ?  ?Equipment Recommendations ? None recommended by PT  ?  ?Recommendations for Other Services   ? ? ?  ?Precautions / Restrictions Precautions ?Precautions: Posterior Hip ?Precaution Booklet Issued: Yes (comment) ?Precaution Comments: instructed pt in  posterior hip precautions ?Restrictions ?Other Position/Activity Restrictions: WBAT RLE  ?  ? ?Mobility ? Bed Mobility ?  ?  ?  ?  ?  ?  ?  ?General bed mobility comments: Pt declined OOB, reports she is unable and did not want to sit EOB even with encouragement and assistance. ?  ? ?Transfers ?  ?  ?  ?  ?  ?  ?  ?  ?  ?  ?  ? ?Ambulation/Gait ?  ?  ?  ?  ?  ?  ?  ?  ? ? ?Stairs ?  ?  ?  ?  ?  ? ? ?Wheelchair Mobility ?  ? ?Modified Rankin (Stroke Patients Only) ?  ? ? ?  ?Balance   ?  ?  ?  ?  ?  ?  ?  ?  ?  ?  ?  ?  ?  ?  ?  ?  ?  ?  ?  ? ?  ?Cognition Arousal/Alertness: Awake/alert ?Behavior During Therapy: [facility] for tasks assessed/performed ?Overall Cognitive Status: No family/caregiver present to determine baseline cognitive functioning ?Area of Impairment: Memory, Problem solving, Safety/judgement ?  ?  ?  ?  ?  ?  ?  ?  ?  ?  ?  ?  ?  ?  ?Problem Solving: Slow processing, Requires verbal cues, Requires tactile cues ?  ?  ?  ? ?  ?Exercises Total Joint Exercises ?Ankle Circles/Pumps: AROM, Both, Supine, Strengthening, 10 reps ?Quad Sets: AROM, Strengthening, Both, 10 reps, Supine ?Short Arc Quad: AAROM, AROM, Strengthening, Both, 10 reps, Supine ?Heel Slides: AAROM, Strengthening, Both, 10 reps, Supine ?Hip ABduction/ADduction: AAROM, Strengthening, Both, 10 reps, Supine ? ?  ?General Comments   ?  ?  ? ?  Pertinent Vitals/Pain Pain Assessment ?Pain Assessment: 0-10 ?Pain Score: 7  ?Pain Location: R leg ?Pain Descriptors / Indicators: Discomfort, Grimacing, Guarding ?Pain Intervention(s): Repositioned, Limited activity within patient's tolerance, Monitored during session, Patient requesting pain meds-RN notified  ? ? ?Home Living   ?  ?  ?  ?  ?  ?  ?  ?  ?  ?   ?  ?Prior Function    ?  ?  ?   ? ?PT Goals (current goals can now be found in the care plan section) Acute Rehab PT Goals ?Potential to Achieve Goals: Fair ?Progress towards PT goals: Progressing toward goals ? ?  ?Frequency ? ? ? Min 3X/week ? ? ? ?   ?PT Plan Current plan remains appropriate  ? ? ?Co-evaluation   ?  ?  ?  ?  ? ?  ?AM-PAC PT "6 Clicks" Mobility   ?Outcome Measure ? Help needed turning from your back to your side while in a flat bed without using bedrails?: Total ?Help needed moving from lying on your back to sitting on the side of a flat bed without using bedrails?: Total ?Help needed moving to and from a bed to a chair (including a wheelchair)?: A Lot ?Help needed standing up from a chair using your arms (e.g., wheelchair or bedside chair)?: A Lot ?Help needed to walk in hospital room?: Total ?Help needed climbing 3-5 steps with a railing? : Total ?6 Click Score: 8 ? ?  ?End of Session   ?Activity Tolerance: Patient limited by pain;Patient limited by fatigue ?Patient left: in bed;with bed alarm set;with call bell/phone within reach ?Nurse Communication: Patient requests pain meds;Need for lift equipment;Mobility status ?PT Visit Diagnosis: Difficulty in walking, not elsewhere classified (R26.2);Pain;History of falling (Z91.81);Repeated falls (R29.6) ?Pain - Right/Left: Right ?Pain - part of body: Hip ?  ? ? ?Time: 1884-1660 ?PT Time Calculation (min) (ACUTE ONLY): 32 min ? ?Charges:  $Therapeutic Exercise: 8-22 mins ?$Self Care/Home Management: 8-22          ?          ? ? ?Greggory Stallion ?09/22/2021, 9:29 AM ? ?

## 2021-09-22 NOTE — Progress Notes (Signed)
Occupational Therapy Treatment ?Patient Details ?Name: [name] [name] ?MRN: [id number] ?DOB: 1946/06/22 ?Today's Date: 09/22/2021 ? ? ?History of present illness 76 y.o. female with PMH significant for admission 08/19/21 with R hip fx s/p hemiarthroplasty (pt had DCed home from rehab 2 days prior to this admission),  autoimmune hepatitis, HTN, CKD3a, HTN. Pt now presenting with right hip pain after a fall and found to have periprosthetic intertrochanteric fracture of the right femur, s/p convertion to posterior THA 09/18/21. She had 3 episodes concerning for seizures. ?  ?OT comments ? Treatment focused on functional mobility today and reiterating posterior hip precautions. Patient min guard to stand from elevated bed height with walker and slide/scoot left foot to get to the recliner. She exhibited difficulty advancing RLE. Continue to recommend short term rehab.   ? ?Recommendations for follow up therapy are one component of a multi-disciplinary discharge planning process, led by the attending physician.  Recommendations may be updated based on patient status, additional functional criteria and insurance authorization. ?   ?Follow Up Recommendations ? Skilled nursing-short term rehab (<3 hours/day)  ?  ?Assistance Recommended at Discharge Frequent or constant Supervision/Assistance  ?Patient can return home with the following ? A lot of help with walking and/or transfers;A lot of help with bathing/dressing/bathroom;Assistance with cooking/housework;Help with stairs or ramp for entrance;Direct supervision/assist for medications management;Direct supervision/assist for financial management ?  ?Equipment Recommendations ?  (Defer to next venue)  ?  ?Recommendations for Other Services   ? ?  ?Precautions / Restrictions Precautions ?Precautions: Posterior Hip ?Precaution Booklet Issued: Yes (comment) ?Precaution Comments: instructed pt in posterior hip precautions ?Restrictions ?Weight Bearing Restrictions: No ?Other  Position/Activity Restrictions: WBAT RLE  ? ? ?  ? ?Mobility Bed Mobility ?Overal bed mobility: Needs Assistance ?Bed Mobility: Supine to Sit ?  ?  ?Supine to sit: Mod assist, HOB elevated ?  ?  ?General bed mobility comments: Mod assist to transfer to side of bed with assistance for lower extremities - patient managing upper body with use of bed rails. ?  ? ?Transfers ?Overall transfer level: Needs assistance ?Equipment used: Rolling walker (2 wheels) ?Transfers: Sit to/from Stand, Bed to chair/wheelchair/BSC ?Sit to Stand: Min guard, From elevated surface, +2 safety/equipment ?  ?  ?Step pivot transfers: Min guard, +2 safety/equipment ?  ?  ?General transfer comment: Patient min guard to stand with RW and transfer to recliner. Walker too tall and patient having difficulty to manage RLE (appears longer). Patient sliding/scooting left foot to manuever and unable to take an actual step. ?  ?  ?Balance Overall balance assessment: Needs assistance ?Sitting-balance support: No upper extremity supported ?Sitting balance-Leahy Scale: Fair ?  ?  ?Standing balance support: During functional activity, Reliant on assistive device for balance ?Standing balance-Leahy Scale: Poor ?  ?  ?  ?  ?  ?  ?  ?  ?  ?  ?  ?  ?   ? ?ADL either performed or assessed with clinical judgement  ? ?ADL   ?  ?  ?  ?  ?  ?  ?  ?  ?  ?  ?  ?  ?  ?  ?  ?  ?  ?  ?  ?  ?  ? ?Extremity/Trunk Assessment   ?  ?  ?  ?  ?  ? ?Vision   ?  ?  ?Perception   ?  ?Praxis   ?  ? ?Cognition Arousal/Alertness: Awake/alert ?Behavior During Therapy: [facility] for  tasks assessed/performed ?Overall Cognitive Status: No family/caregiver present to determine baseline cognitive functioning ?  ?  ?  ?  ?  ?  ?  ?  ?  ?  ?  ?  ?  ?  ?  ?  ?General Comments: Patient alert to self and situation. Knows the month - states 2013 as the year otherwise functional cognition. Able to follow all commands. ?  ?  ?   ?Exercises   ? ?  ?Shoulder Instructions   ? ? ?  ?General Comments     ? ? ?Pertinent Vitals/ Pain       Pain Assessment ?Pain Assessment: 0-10 ?Pain Score: 7  ?Pain Location: R leg ?Pain Descriptors / Indicators: Discomfort, Grimacing, Guarding ?Pain Intervention(s): Limited activity within patient's tolerance, Patient requesting pain meds-RN notified ? ?Home Living   ?  ?  ?  ?  ?  ?  ?  ?  ?  ?  ?  ?  ?  ?  ?  ?  ?  ?  ? ?  ?Prior Functioning/Environment    ?  ?  ?  ?   ? ?Frequency ? Min 2X/week  ? ? ? ? ?  ?Progress Toward Goals ? ?OT Goals(current goals can now be found in the care plan section) ? Progress towards OT goals: Progressing toward goals ? ?Acute Rehab OT Goals ?Patient Stated Goal: less pain ?OT Goal Formulation: With patient ?Time For Goal Achievement: 10/04/21 ?Potential to Achieve Goals: Good  ?Plan Discharge plan remains appropriate   ? ?Co-evaluation ? ? ?   ?  ?  ?OT goals addressed during session:  (functional mobility) ?  ? ?  ?AM-PAC OT "6 Clicks" Daily Activity     ?Outcome Measure ? ? Help from another person eating meals?: A Little ?Help from another person taking care of personal grooming?: A Little ?Help from another person toileting, which includes using toliet, bedpan, or urinal?: Total ?Help from another person bathing (including washing, rinsing, drying)?: A Lot ?Help from another person to put on and taking off regular upper body clothing?: A Lot ?Help from another person to put on and taking off regular lower body clothing?: A Lot ?6 Click Score: 13 ? ?  ?End of Session Equipment Utilized During Treatment: Gait belt;Rolling walker (2 wheels) ? ?OT Visit Diagnosis: Unsteadiness on feet (R26.81);Other abnormalities of gait and mobility (R26.89);Muscle weakness (generalized) (M62.81);History of falling (Z91.81);Pain ?  ?Activity Tolerance Patient tolerated treatment well ?  ?Patient Left in chair;with call bell/phone within reach;with chair alarm set ?  ?Nurse Communication Patient requests pain meds ?  ? ?   ? ?Time: 1130-1145 ?OT Time Calculation  (min): 15 min ? ?Charges: OT General Charges ?$OT Visit: 1 Visit ?OT Treatments ?$Therapeutic Activity: 8-22 mins ? ?Aditri Louischarles, OTR/L ?Acute Care Rehab Services  ?Office [phone] ?Pager: [phone]  ? ?Gimena Buick L Irma Roulhac ?09/22/2021, 12:30 PM ?

## 2021-09-22 NOTE — TOC Progression Note (Signed)
Transition of Care (TOC) - Progression Note  ? ? ?Patient Details  ?Name: [name] [name] ?MRN: [id number] ?Date of Birth: 1945/08/05 ? ?Transition of Care (TOC) CM/SW Contact  ?Geni Bers, RN ?Phone Number: ?09/22/2021, 12:10 PM ? ?Clinical Narrative:    ? ? ?Pt is in her co-pay days.SNF will need to start authorization for insurance Co. Pt is not Navi. Waiting to hear from SNF.  ?  ?  ? ?Expected Discharge Plan and Services ?  ?  ?  ?  ?  ?                ?  ?  ?  ?  ?  ?  ?  ?  ?  ?  ? ? ?Social Determinants of Health (SDOH) Interventions ?  ? ?Readmission Risk Interventions ?No flowsheet data found. ? ?

## 2021-09-22 NOTE — TOC Progression Note (Signed)
Transition of Care (TOC) - Progression Note  ? ? ?Patient Details  ?Name: Katharina S Lahmann ?MRN: [id number] ?Date of Birth: 05-03-1946 ? ?Transition of Care (TOC) CM/SW Contact  ?Geni Bers, RN ?Phone Number: ?09/22/2021, 9:43 AM ? ?Clinical Narrative:    ?Continuing to reach out to SNF for a bed for pt. Will start insurance auth when bed is found.  ? ? ?  ?  ? ?Expected Discharge Plan and Services ?  ?  ?  ?  ?  ?                ?  ?  ?  ?  ?  ?  ?  ?  ?  ?  ? ? ?Social Determinants of Health (SDOH) Interventions ?  ? ?Readmission Risk Interventions ?No flowsheet data found. ? ?

## 2021-09-22 NOTE — Progress Notes (Signed)
?  Progress Note ? ? ?Patient: [name] [name] [id number] DOB: March 03, 1946 DOA: 09/18/2021     4 ?DOS: the patient was seen and examined on 09/22/2021 ?  ? ? ? ? ?Brief hospital course: ?[name] [name] was admitted to the hospital with the working diagnosis of right periprosthetic femur fracture.  ? ?76 yo female with the past medical history of autoimmune hepatitis, hypertension, and chronic kidney disease who presented after a mechanical fall. She had a recent right hip repair and discharge to SNF. After 2 days of being at home she lost her balance and fell to her right side. On her initial physical examination her blood pressure was 137/86, HR 92, RR 14 and oxygen saturation 93%. Lungs clear to auscultation, heart with S1 and S2 present and rhythmic, abdomen soft and no lower extremity edema.  ? ?Pelvic radiograph with oblique periprosthetic fracture of the left intertrochanteric region of femur extending into the proximal diaphysis with 7 mm of medial displacement.  ? ?Patient was admitted to the medical ward, was placed on analgesics and DVT prophylaxis. ?Orthopedics was consulted. ? ?03/04 underwent revision component right hip, conversion of previous total hip arthroplasty.  ? ?03/05 patient had a witness seizure and neurology was consulted. Tonic clonic movements, for 2 minutes, followed by post ictal state. ? ?Initially placed on Keppra for seizure prophylaxis. Work up with brain and cervical spine MRI with acute changes, EEG with no active seizures. ?Keppra has been discontinued.  ? ?Plan to transfer to SNF.   ? ? ? ? ? ? ? ?Assessment and Plan: ?* Periprosthetic intertrochanteric fracture of femur ?- Per orthopedics ? ?Seizure ([place]) ?First time seizure. MRI brain normal. ?- Stop AED ?- Follow up as outpatient with neurology.  ? ?Stage 3a chronic kidney disease (CKD) ([place]) ?Stable relative to baseline ? ?Hypertension ?BP normal ?-Continue metoprolol ? ?Autoimmune hepatitis ([place]) ?- Continue prednisone and  azathioprine ? ?Normocytic anemia ?Hgb stable, slightly up ? ?Pressure injury of skin ?Stage II sacrum ? ? ? ? ? ?  ? ?Subjective: No seizures, no headache, no fever, no chest pain, no confusion. ? ?Physical Exam: ?Vitals:  ? 09/21/21 1000 09/21/21 1227 09/21/21 2008 09/22/21 0449  ?BP:  129/75 (!) 146/83 (!) 147/90  ?Pulse:  66 76 86  ?Resp:  18 18 20   ?Temp:  97.8 ?F (36.6 ?C) 98.8 ?F (37.1 ?C) 98.2 ?F (36.8 ?C)  ?TempSrc:  Oral Oral Oral  ?SpO2:  96% 95% 96%  ?Height: 5\' 4"  (1.626 m)     ? ?Elderly adult female, sitting up in recliner, interactive, no acute distress ?RRR, no murmurs, no lower extremity edema ?Normal respiratory rate and rhythm, lungs clear without rales or wheezes ?Attention normal, affect appropriate, judgment insight appear normal.  Face symmetric, moves upper extremities with generalized weakness but symmetric strength. ? ? ? ? ? ?Data Reviewed: ?Nursing notes, neurology notes reviewed.  Hemogram reviewed. ?Hemoglobin significant for slight increase to 7.9 ? ?Family Communication:   ? ?Disposition: ?Status is: Inpatient ?Remains inpatient appropriate because: He will require nursing home rehabilitation after discharge, this is pending, she is medically stable for discharge when a safe disposition can be found ? Planned Discharge Destination: Skilled nursing facility ? ? ? ?  ? ?Author: ?Edwin Dada, MD ?09/22/2021 4:20 PM ? ?For on call review [email or web address].  ?

## 2021-09-23 NOTE — Progress Notes (Signed)
Speech Language Pathology Treatment: Dysphagia  ?Patient Details ?Name: [name] [name] ?MRN: [id number] ?DOB: 02/27/1946 ?Today's Date: 09/23/2021 ?Time: 1211-1222 ?SLP Time Calculation (min) (ACUTE ONLY): 11 min ? ?Assessment / Plan / Recommendation ?Clinical Impression ? Pt denies any difficulties with chewing or swallowing over the last few days. She was observed today self-feeding during her lunch meal with no overt signs of dysphagia or aspiration observed. She is without subject complaints. No further acute SLP f/u indicated at this time. Pt in agreement - will sign off. ?  ?HPI HPI: Patient is a 75 y.o. female with PMH: autoimmune hepatitis, HTN, CKD IIIa, HTN. She presented to hospital with right hip pain after a fall. She recently(08/20/21)  had right hip repair and f/u rehab, discharged home from rehab two days prior to current admission. She had been ambulating in home with cane. on 3/3, she lost her balance while walking from her kitchen to the hallway and fell on her right side. (no head injury or LOC). She was unable to get up and called EMS. CT of right hip showed  periprosthetic right femur fracture and she underwent femoral component revision conversion to total hip arthroplasty (right hip). Speech swallow evaluation ordered secondary to concern of patient not masticating soft solids (noodles and chicken in soup). ?  ?   ?SLP Plan ? All goals met ? ?  ?  ?Recommendations for follow up therapy are one component of a multi-disciplinary discharge planning process, led by the attending physician.  Recommendations may be updated based on patient status, additional functional criteria and insurance authorization. ?  ? ?Recommendations  ?Diet recommendations: Regular;Thin liquid ?Liquids provided via: Cup;Straw ?Medication Administration: Whole meds with puree ?Supervision: Patient able to self feed;Intermittent supervision to cue for compensatory strategies ?Compensations: Slow rate;Small sips/bites ?Postural  Changes and/or Swallow Maneuvers: Seated upright 90 degrees  ?   ?    ?   ? ? ? ? Oral Care Recommendations: Oral care BID ?Follow Up Recommendations: No SLP follow up ?Assistance recommended at discharge: None ?SLP Visit Diagnosis: Dysphagia, unspecified (R13.10) ?Plan: All goals met ? ? ? ? ?  ?  ? ? ? N., M.A. CCC-SLP ?Acute Rehabilitation Services ?Pager [phone] ?Office [phone] ? ?09/23/2021, 12:25 PM ?

## 2021-09-23 NOTE — Progress Notes (Signed)
?  Progress Note ? ? ?Patient: [name] [name] [id number] DOB: 10-25-1945 DOA: 09/18/2021     5 ?DOS: the patient was seen and examined on 09/23/2021 ?  ? ? ? ? ?Brief hospital course: ?[name] is a 76 y.o. F with AIH, HTN and CKD who presented with a fall and left hip pain.   ? ?Had had a recent right hip repair, discharged to SNF. 2 days after returning home from SNF, she fell at home, could not stand.  ? ?In the ER, pelvic radiograph with oblique periprosthetic fracture of the left intertrochanteric region of femur extending into the proximal diaphysis with 7 mm of medial displacement.  ? ? ? ?3/3: Admitted, Orthopedics was consulted. ?3/4: underwent revision component right hip, conversion of previous total hip arthroplasty.  ?3/5: pt had witnessed seizure, neurology was consulted. Tonic clonic movements, for 2 minutes, followed by post ictal state ? ? ? ? ? ? ? ? ? ? ?Assessment and Plan: ?* Periprosthetic intertrochanteric fracture of femur ?- Per orthopedics ? ?Seizure ([place]) ?Patient had witnessed seizure on 3/5, neurology was consulted. Tonic clonic movements, for 2 minutes, followed by post ictal state ? ?Initially placed on Keppra for seizure prophylaxis. Work up with brain and cervical spine MRI with no acute changes, EEG with no active seizures.  AEDs discontinued.    ?- Follow up as outpatient with neurology.  ? ?Stage 3a chronic kidney disease (CKD) ([place]) ?Stable relative to baseline ? ?Hypertension ?BP normal ?-Continue metoprolol ? ?Autoimmune hepatitis ([place]) ?- Continue prednisone and azathioprine ? ?Normocytic anemia ?- Repeat Hgb in 1 month ? ?Pressure injury of skin ?Stage II sacrum ? ? ? ? ? ?  ? ?Subjective: No fever, confusion, seizures, headache, chest pain, dyspnea. ? ?Physical Exam: ?Vitals:  ? 09/21/21 2008 09/22/21 0449 09/22/21 1737 09/22/21 2040  ?BP: (!) 146/83 (!) 147/90 (!) 145/79 (!) 147/77  ?Pulse: 76 86 76 73  ?Resp: 18 20  18   ?Temp: 98.8 ?F (37.1 ?C) 98.2 ?F (36.8 ?C) 98.4 ?F  (36.9 ?C) 98.2 ?F (36.8 ?C)  ?TempSrc: Oral Oral Oral Oral  ?SpO2: 95% 96% 96% 97%  ?Height:      ? ?Elderly adult female, sitting up in recliner, interactive, no acute distress ?RRR, no murmurs, no lower extremity edema ?Normal respiratory rate and rhythm, lungs clear without rales or wheezes ?Attention normal, affect appropriate, judgment insight appear normal.  Face symmetric, moves upper extremities with generalized weakness but symmetric strength. ? ? ? ? ? ?Data Reviewed: ?Wital signs, nursing notes reviewed.  No other new labs. ? ?Family Communication:   ? ?Disposition: ?Status is: Inpatient ?Remains inpatient appropriate because: He will require nursing home rehabilitation after discharge, this is pending, she is medically stable for discharge when a safe disposition can be found ? Planned Discharge Destination: Skilled nursing facility ? ? ?  ? ?Author: ?Edwin Dada, MD ?09/23/2021 4:00 PM ? ?For on call review [email or web address].  ?

## 2021-09-23 NOTE — TOC Progression Note (Signed)
Transition of Care (TOC) - Progression Note  ? ? ?Patient Details  ?Name: [name] [name] ?MRN: [id number] ?Date of Birth: Apr 14, 1946 ? ?Transition of Care (TOC) CM/SW Contact  ?Geni Bers, RN ?Phone Number: ?09/23/2021, 3:09 PM ? ?Clinical Narrative:    ? ?A call was made to pt's spouse with no answer. A call was made to pt's son who state that he and husband selected [facility] for SNF. A call to [facility] was made, [facility] states that pt's husband will need to call related to pt being in co-pay days. Pt will need to pay 7 days in advance. [facility] will also need to start insurance auth,pt is not Navi.  ? ?  ?  ? ?Expected Discharge Plan and Services ?  ?  ?  ?  ?  ?                ?  ?  ?  ?  ?  ?  ?  ?  ?  ?  ? ? ?Social Determinants of Health (SDOH) Interventions ?  ? ?Readmission Risk Interventions ?No flowsheet data found. ? ?

## 2021-09-23 NOTE — Care Management Important Message (Signed)
Important Message ? ?Patient Details IM Letter given to the Patient. ?Name: [name] [name] ?MRN: [id number] ?Date of Birth: 05-26-1946 ? ? ?Medicare Important Message Given:  Yes ? ? ? ? ?Caren Macadam ?09/23/2021, 1:51 PM ?

## 2021-09-24 ENCOUNTER — Encounter: Payer: Self-pay | Admitting: Orthopedic Surgery

## 2021-09-24 DIAGNOSIS — M9701XA Periprosthetic fracture around internal prosthetic right hip joint, initial encounter: Secondary | ICD-10-CM | POA: Diagnosis not present

## 2021-09-24 DIAGNOSIS — N1831 Chronic kidney disease, stage 3a: Secondary | ICD-10-CM | POA: Diagnosis not present

## 2021-09-24 DIAGNOSIS — K754 Autoimmune hepatitis: Secondary | ICD-10-CM | POA: Diagnosis not present

## 2021-09-24 DIAGNOSIS — Z4789 Encounter for other orthopedic aftercare: Secondary | ICD-10-CM | POA: Diagnosis not present

## 2021-09-24 DIAGNOSIS — S79929A Unspecified injury of unspecified thigh, initial encounter: Secondary | ICD-10-CM | POA: Diagnosis not present

## 2021-09-24 DIAGNOSIS — L89150 Pressure ulcer of sacral region, unstageable: Secondary | ICD-10-CM | POA: Diagnosis not present

## 2021-09-24 DIAGNOSIS — L22 Diaper dermatitis: Secondary | ICD-10-CM | POA: Diagnosis not present

## 2021-09-24 DIAGNOSIS — R112 Nausea with vomiting, unspecified: Secondary | ICD-10-CM | POA: Diagnosis not present

## 2021-09-24 DIAGNOSIS — R569 Unspecified convulsions: Secondary | ICD-10-CM | POA: Diagnosis not present

## 2021-09-24 DIAGNOSIS — L89154 Pressure ulcer of sacral region, stage 4: Secondary | ICD-10-CM | POA: Diagnosis not present

## 2021-09-24 DIAGNOSIS — M80051D Age-related osteoporosis with current pathological fracture, right femur, subsequent encounter for fracture with routine healing: Secondary | ICD-10-CM | POA: Diagnosis not present

## 2021-09-24 DIAGNOSIS — R0989 Other specified symptoms and signs involving the circulatory and respiratory systems: Secondary | ICD-10-CM | POA: Diagnosis not present

## 2021-09-24 DIAGNOSIS — D509 Iron deficiency anemia, unspecified: Secondary | ICD-10-CM | POA: Diagnosis not present

## 2021-09-24 DIAGNOSIS — Z743 Need for continuous supervision: Secondary | ICD-10-CM | POA: Diagnosis not present

## 2021-09-24 DIAGNOSIS — L89159 Pressure ulcer of sacral region, unspecified stage: Secondary | ICD-10-CM | POA: Diagnosis not present

## 2021-09-24 DIAGNOSIS — R079 Chest pain, unspecified: Secondary | ICD-10-CM | POA: Diagnosis not present

## 2021-09-24 DIAGNOSIS — R748 Abnormal levels of other serum enzymes: Secondary | ICD-10-CM | POA: Diagnosis not present

## 2021-09-24 DIAGNOSIS — M978XXD Periprosthetic fracture around other internal prosthetic joint, subsequent encounter: Secondary | ICD-10-CM | POA: Diagnosis not present

## 2021-09-24 DIAGNOSIS — I10 Essential (primary) hypertension: Secondary | ICD-10-CM | POA: Diagnosis not present

## 2021-09-24 DIAGNOSIS — G8911 Acute pain due to trauma: Secondary | ICD-10-CM | POA: Diagnosis not present

## 2021-09-24 DIAGNOSIS — D649 Anemia, unspecified: Secondary | ICD-10-CM | POA: Diagnosis not present

## 2021-09-24 DIAGNOSIS — Z96649 Presence of unspecified artificial hip joint: Secondary | ICD-10-CM | POA: Diagnosis not present

## 2021-09-24 DIAGNOSIS — M6281 Muscle weakness (generalized): Secondary | ICD-10-CM | POA: Diagnosis not present

## 2021-09-24 DIAGNOSIS — S72001A Fracture of unspecified part of neck of right femur, initial encounter for closed fracture: Secondary | ICD-10-CM | POA: Diagnosis not present

## 2021-09-24 DIAGNOSIS — R0600 Dyspnea, unspecified: Secondary | ICD-10-CM | POA: Diagnosis not present

## 2021-09-24 DIAGNOSIS — M978XXA Periprosthetic fracture around other internal prosthetic joint, initial encounter: Secondary | ICD-10-CM | POA: Diagnosis not present

## 2021-09-24 DIAGNOSIS — J439 Emphysema, unspecified: Secondary | ICD-10-CM | POA: Diagnosis not present

## 2021-09-24 DIAGNOSIS — M9701XD Periprosthetic fracture around internal prosthetic right hip joint, subsequent encounter: Secondary | ICD-10-CM | POA: Diagnosis not present

## 2021-09-24 LAB — CULTURE, BLOOD (ROUTINE X 2)
Culture: NO GROWTH
Culture: NO GROWTH
Special Requests: ADEQUATE
Special Requests: ADEQUATE

## 2021-09-24 MED ORDER — HYDROCODONE-ACETAMINOPHEN 5-325 MG PO TABS: 1.0000 | ORAL_TABLET | Freq: Four times a day (QID) | ORAL | 0 refills | Status: AC | PRN

## 2021-09-24 MED ORDER — ASPIRIN 81 MG PO CHEW: 81.0000 mg | CHEWABLE_TABLET | Freq: Two times a day (BID) | ORAL | Status: DC

## 2021-09-24 NOTE — Discharge Summary (Signed)
Physician Discharge Summary   Patient: [name] [name] MRN: [id number] DOB: April 22, 1946  Admit date:     09/18/2021  Discharge date: 09/24/21  Discharge Physician: Edwin Dada   PCP: Perrin Maltese, MD   Recommendations at discharge:  Follow up with Dr. Lyla Glassing in 2 weeks, around Mar 18 Take aspirin 81 mg twice daily for DVT prophylaxis for 1 month Follow up with Neurology in 6-8 weeks Dr. Humphrey Rolls: Please check Hgb in 1 month      Discharge Diagnoses: Principal Problem:   Periprosthetic intertrochanteric fracture of femur Active Problems:   Seizure ([place])   Stage 3a chronic kidney disease (CKD) (HCC)   Hypertension   Autoimmune hepatitis ([place])   Normocytic anemia   Pressure injury of skin        Hospital Course: [name] [name] is a 76 y.o. F with AIH, HTN and CKD who presented with a fall and left hip pain.    Had had a recent right hip repair, discharged to SNF. 2 days after returning home from SNF, she fell at home, could not stand.   In the ER, pelvic radiograph with oblique periprosthetic fracture of the left intertrochanteric region of femur extending into the proximal diaphysis with 7 mm of medial displacement.     3/3: Admitted, Orthopedics was consulted. 3/4: underwent revision component right hip, conversion of previous total hip arthroplasty.  3/5: pt had witnessed seizure, neurology was consulted. Tonic clonic movements, for 2 minutes, followed by post ictal state       Assessment and Plan: * Periprosthetic intertrochanteric fracture of femur Underwent revision of hip repair to [facility] on 3/4 by Dr. Lyla Glassing.      Seizure [facility]) Patient had witnessed seizure on 3/5, neurology was consulted. Tonic clonic movements, for 2 minutes, followed by post ictal state  Initially placed on Keppra for seizure prophylaxis. Work up with brain and cervical spine MRI with no acute changes, EEG with no active seizures.  AEDs discontinued.    - Follow up as  outpatient with neurology.   Stage 3a chronic kidney disease (CKD) (HCC) Stable relative to baseline  Hypertension  Autoimmune hepatitis (HCC) Continue prednisone and azathioprine  Normocytic anemia - Repeat Hgb in 1 month  Pressure injury of skin Stage II sacrum         Pain control - [place] Controlled Substance Reporting System database was reviewed.      Consultants: Orthopedics, Neurology Procedures performed: EEG, MRI brain  Disposition: Skilled nursing facility   DISCHARGE MEDICATION: Allergies as of 09/24/2021       Reactions   Sulfa Antibiotics Nausea And Vomiting   Pt has not had a sulfa drug in years and is not certain of reaction but suspects nausea and vomiting.   Sulfasalazine Nausea And Vomiting        Medication List     STOP taking these medications    aspirin EC 81 MG tablet Replaced by: aspirin 81 MG chewable tablet   enoxaparin 30 MG/0.3ML injection Commonly known as: LOVENOX   sulfamethoxazole-trimethoprim 800-160 MG tablet Commonly known as: BACTRIM DS       TAKE these medications    acetaminophen 325 MG tablet Commonly known as: TYLENOL Take 2 tablets (650 mg total) by mouth every 6 (six) hours as needed for mild pain or moderate pain (pain score 1-3 or temp > 100.5).   aspirin 81 MG chewable tablet Chew 1 tablet (81 mg total) by mouth 2 (two) times daily. Replaces: aspirin  EC 81 MG tablet   azaTHIOprine 50 MG tablet Commonly known as: IMURAN Take 25 mg by mouth daily.   Calcium Carbonate Antacid 600 MG chewable tablet Chew 600 mg by mouth.   calcium citrate-vitamin D 315-200 MG-UNIT tablet Commonly known as: CITRACAL+D Take 1 tablet by mouth 2 (two) times daily.   cyanocobalamin 100 MCG tablet Take 100 mcg by mouth daily.   estradiol 0.1 MG/GM vaginal cream Commonly known as: ESTRACE Place 1 Applicatorful vaginally 3 (three) times a week.   feeding supplement Liqd Take 237 mLs by mouth 2 (two) times  daily between meals.   Ferrous Fumarate 324 (106 Fe) MG Tabs tablet Commonly known as: HEMOCYTE - 106 mg FE Take 1 tablet by mouth daily.   HYDROcodone-acetaminophen 5-325 MG tablet Commonly known as: NORCO/VICODIN Take 1 tablet by mouth every 6 (six) hours as needed for up to 7 days for severe pain.   metoprolol tartrate 50 MG tablet Commonly known as: LOPRESSOR Take 50 mg by mouth daily.   multivitamin with minerals Tabs tablet Take 1 tablet by mouth daily.   omeprazole 40 MG capsule Commonly known as: PRILOSEC Take 40 mg by mouth daily.   polyethylene glycol 17 g packet Commonly known as: MIRALAX / GLYCOLAX Take 17 g by mouth daily as needed for mild constipation.   potassium chloride 10 MEQ tablet Commonly known as: KLOR-CON Take 10 mEq by mouth daily.   predniSONE 5 MG tablet Commonly known as: DELTASONE Take 7.5 mg by mouth daily.   Walker Misc 1 Device by Does not apply route as directed.               Discharge Care Instructions  (From admission, onward)           Start     Ordered   09/24/21 0000  Discharge wound care:       Comments: Leave dressing in place until Orthopedics follow up on 3/18   09/24/21 1207            Follow-up Information     Swinteck, Aaron Edelman, MD. Schedule an appointment as soon as possible for a visit in 2 week(s).   Specialty: Orthopedic Surgery Why: For wound re-check, For suture removal Contact information: [address] [place] [address] [phone]         [facility]. Schedule an appointment as soon as possible for a visit in 2 month(s).   Why: for seizure follow up Contact information: [address]     [place] [phone]               Discharge Instructions     Ambulatory referral to Neurology   Complete by: As directed    An appointment is requested in approximately: 8 weeks   Discharge instructions    Complete by: As directed    From Dr Loleta Books: You were admitted for repair of your hip fracture. This was done by Dr. Lyla Glassing Go see Dr. Lyla Glassing in 2 weeks on Mar 18 or close to it  For the next month, take aspirin 81 mg twice daily to prevent blood clots  For pain, take hydrocodone 5-325 up to three times daily  Resume your other home medicines  While you were here, you had a seizure Your brain imaging with MRI was normal Your EEG (the electrical activity of the brain) was nromal  You do not need specific medicines at this time, as your risk  of another seizure is low. However, you may not drive for 6 months from your seizure  You should see a Neurology specialist in 6-8 weeks, we have sent this referral   Discharge wound care:   Complete by: As directed    Leave dressing in place until Orthopedics follow up on 3/18   Increase activity slowly   Complete by: As directed        Discharge Exam: General: Pt is alert, awake, not in acute distress, sitting up in bed Cardiovascular: RRR, nl S1-S2, no murmurs appreciated.   No LE edema.   Respiratory: Normal respiratory rate and rhythm.  CTAB without rales or wheezes. Neuro/Psych: Strength symmetric in upper extremities, lower extremity strength not tested due to pain.  Judgment and insight appear normal.   Condition at discharge: stable  The results of significant diagnostics from this hospitalization (including imaging, microbiology, ancillary and laboratory) are listed below for reference.   Imaging Studies: DG Chest 1 View  Result Date: 09/17/2021 CLINICAL DATA:  s/p fall onto R side. C/o R thigh pain with movement and palpation. Prefers lying on L side. No obvious shortening or rotation. D/c'd from rehab yesterday s/p R hip surgery from fall 1 month ago. HTN EXAM: CHEST  1 VIEW COMPARISON:  08/19/2021 FINDINGS: Cardiac silhouette mildly enlarged. Moderate to large hiatal hernia, stable. No mediastinal or hilar masses. Mild  atelectasis adjacent to the hiatal hernia. Lungs otherwise clear. No convincing pleural effusion and no pneumothorax. Skeletal structures are demineralized but grossly intact. IMPRESSION: No acute cardiopulmonary disease. Electronically Signed   By: Lajean Manes M.D.   On: 09/17/2021 13:10   DG Pelvis 1-2 Views  Result Date: 09/17/2021 CLINICAL DATA:  Status post fall, right thigh pain EXAM: PELVIS - 1-2 VIEW; RIGHT FEMUR 2 VIEWS COMPARISON:  None. FINDINGS: Generalized osteopenia. Left hip arthroplasty. Oblique periprosthetic fracture of the left intertrochanteric region of femur extending into the proximal diaphysis with 7 mm of medial displacement. No other fracture or dislocation. Mild medial and lateral femorotibial compartment joint space narrowing. Peripheral vascular atherosclerotic disease. IMPRESSION: 1. Oblique periprosthetic fracture of the left intertrochanteric region of femur extending into the proximal diaphysis with 7 mm of medial displacement. Electronically Signed   By: Kathreen Devoid M.D.   On: 09/17/2021 13:11   MR BRAIN WO CONTRAST  Result Date: 09/19/2021 CLINICAL DATA:  Seizures EXAM: MRI HEAD WITHOUT CONTRAST TECHNIQUE: Multiplanar, multiecho pulse sequences of the brain and surrounding structures were obtained without intravenous contrast. COMPARISON:  None. FINDINGS: Evaluation is significantly limited by motion artifact. Brain: No restricted diffusion to suggest acute or subacute infarct. No definite acute hemorrhage. Evaluation for mass is somewhat limited by degree of motion artifact. Moderate T2 hyperintense signal in the periventricular white matter and pons, likely the sequela of chronic small vessel ischemic disease. Suspect multiple lacunar infarcts in the left pons, left thalamus, and left basal ganglia, however evaluation is limited. Evaluation for hippocampal symmetry and signal, as well as gray matter heterotopia and cortical dysgenesis, is significantly limited by motion.  Vascular: Limited by motion. Visualized vessels demonstrate normal flow voids. Skull and upper cervical spine: Grossly normal marrow signal. Please see same-day MRI cervical spine. Sinuses/Orbits: Limited evaluation. No significant sinus disease. Unable to evaluate the orbits. Other: None. IMPRESSION: Evaluation is significantly limited by motion artifact. Within this limitation, no acute or subacute infarct is seen, nor is there seizure-related restricted diffusion. Evaluation for a seizure etiology is also significantly limited. Electronically Signed   By:  Merilyn Baba M.D.   On: 09/19/2021 19:04   MR CERVICAL SPINE WO CONTRAST  Result Date: 09/19/2021 CLINICAL DATA:  Compression fracture EXAM: MRI CERVICAL SPINE WITHOUT CONTRAST TECHNIQUE: Multiplanar, multisequence MR imaging of the cervical spine was performed. No intravenous contrast was administered. COMPARISON:  None. FINDINGS: Evaluation is significantly limited by motion artifact affecting every sequence. Within this limitation, no significant listhesis. The study is essentially nondiagnostic for vertebral fracture, abnormal signal in the cervical spinal cord, and ligamentous injury, as well as evaluation of the neural foramina. Multiple small disc bulges at C4-C5, C5-C6, and C6-C7, without evidence of severe spinal canal stenosis. IMPRESSION: Overall nondiagnostic study secondary to motion artifact affecting a few sequences. No significant listhesis is seen. If there is concern for cervical fracture, consider a CT of the cervical spine. If there is concern for spinal cord or cervical nerve pathology, consider repeating the MRI when the patient is better able to comply with instructions. Electronically Signed   By: Merilyn Baba M.D.   On: 09/19/2021 19:10   DG Pelvis Portable  Result Date: 09/18/2021 CLINICAL DATA:  Postop. EXAM: PORTABLE PELVIS 1-2 VIEWS COMPARISON:  Preoperative imaging. FINDINGS: Revision right hip arthroplasty. Cerclage wire  fixation traverses periprosthetic fracture. Fracture is in improved alignment. Recent postsurgical change includes air and edema in the joint space and soft tissues. Lateral skin staples. IMPRESSION: Revision right hip arthroplasty with cerclage wire fixation of periprosthetic fracture. No immediate postoperative complication. Electronically Signed   By: Keith Rake M.D.   On: 09/18/2021 18:26   CT HIP RIGHT WO CONTRAST  Result Date: 09/18/2021 CLINICAL DATA:  Right femoral fracture, pre-surgical planning EXAM: CT OF THE RIGHT HIP WITHOUT CONTRAST TECHNIQUE: Multidetector CT imaging of the right hip was performed according to the standard protocol. Multiplanar CT image reconstructions were also generated. RADIATION DOSE REDUCTION: This exam was performed according to the departmental dose-optimization program which includes automated exposure control, adjustment of the mA and/or kV according to patient size and/or use of iterative reconstruction technique. COMPARISON:  None. FINDINGS: Bones/Joint/Cartilage Status post right hip arthroplasty. There is a oblique displaced periprosthetic fracture of the right proximal femur including the greater trochanter and extending into the proximal femoral metadiaphysis along the lateral aspect of the right femur. There is approximately 1 cm lateral displacement of the major fracture fragment which measures at least 9.4 cm in craniocaudal dimension. The femoral component is located within the acetabulum. There is no other appreciable fracture. Ligaments Suboptimally assessed by CT. Muscles and Tendons Generalized muscle atrophy.  No intramuscular hematoma. Soft tissues Hematoma/seroma about the lateral aspect of the greater trochanter measuring approximately 5.8 x 3.5 x 13.0 cm IMPRESSION: 1. Displaced oblique periprosthetic fracture of the right proximal femur including the greater trochanter and extending into the proximal femoral metadiaphysis measuring approximately  9.4 cm in craniocaudal dimension and approximately 1 cm lateral displacement. 2. Femoral component is located within the acetabulum. No other appreciable fracture. 3. Large subcutaneous hematoma/seroma measuring 5.8 x 3.5 x 13.0 cm. Electronically Signed   By: Keane Police D.O.   On: 09/18/2021 10:29   EEG adult  Result Date: 09/21/2021 Lora Havens, MD     09/21/2021  8:36 AM Patient Name: [name] [name] MRN: [id number] Epilepsy Attending: Lora Havens Referring Physician/Provider: Florencia Reasons, MD Date: 09/21/2021 Duration: 21.51 mins Patient history: 76 y.o. female with PMH significant for  autoimmune hepatitis, HTN, CKD3a, HTN. Presenting with right hip pain after a fall and found to have  Periprosthetic intertrochanteric fracture of the right femur. She had 3 episodes concerning for seizures. The description seems concerning for a epileptic seizure with L arm jerking, back arching and entire body jerking and eyes rolled in the back of her head. EEG to evaluate for seizure Level of alertness: Awake, asleep AEDs during EEG study: LEV Technical aspects: This EEG study was done with scalp electrodes positioned according to the 10-20 International system of electrode placement. Electrical activity was acquired at a sampling rate of 500Hz  and reviewed with a high frequency filter of 70Hz  and a low frequency filter of 1Hz . EEG data were recorded continuously and digitally stored. Description: The posterior dominant rhythm consists of 7.5 Hz activity of moderate voltage (25-35 uV) seen predominantly in posterior head regions, symmetric and reactive to eye opening and eye closing. Sleep was characterized by vertex waves, sleep spindles (12 to 14 Hz), maximal frontocentral region. EEG showed continuous generalized 3 to 6 Hz theta-delta slowing. Hyperventilation and photic stimulation were not performed.   ABNORMALITY - Continuous slow, generalized IMPRESSION: This study is suggestive of moderate diffuse  encephalopathy, nonspecific etiology. No seizures or epileptiform discharges were seen throughout the recording. Lora Havens   DG C-Arm 1-60 Min-No Report  Result Date: 09/18/2021 Fluoroscopy was utilized by the requesting physician.  No radiographic interpretation.   DG C-Arm 1-60 Min-No Report  Result Date: 09/18/2021 Fluoroscopy was utilized by the requesting physician.  No radiographic interpretation.   DG C-Arm 1-60 Min-No Report  Result Date: 09/18/2021 Fluoroscopy was utilized by the requesting physician.  No radiographic interpretation.   DG HIP UNILAT WITH PELVIS 2-3 VIEWS RIGHT  Result Date: 09/18/2021 CLINICAL DATA:  Right hip ORIF. EXAM: DG HIP (WITH OR WITHOUT PELVIS) 2-3V RIGHT COMPARISON:  Right hip x-ray 09/17/2021 FINDINGS: Intraoperative right hip. Three low resolution intraoperative spot views of the right hip were obtained. No there has been revision of right hip arthroplasty. New cerclage wires are present in the proximal femur. Alignment is anatomic. Total fluoroscopy time: 4 seconds Total radiation dose: 0.87 micro Gy IMPRESSION: Intraoperative right hip arthroplasty revision. Electronically Signed   By: Ronney Asters M.D.   On: 09/18/2021 17:26   DG Femur Min 2 Views Right  Result Date: 09/17/2021 CLINICAL DATA:  Status post fall, right thigh pain EXAM: PELVIS - 1-2 VIEW; RIGHT FEMUR 2 VIEWS COMPARISON:  None. FINDINGS: Generalized osteopenia. Left hip arthroplasty. Oblique periprosthetic fracture of the left intertrochanteric region of femur extending into the proximal diaphysis with 7 mm of medial displacement. No other fracture or dislocation. Mild medial and lateral femorotibial compartment joint space narrowing. Peripheral vascular atherosclerotic disease. IMPRESSION: 1. Oblique periprosthetic fracture of the left intertrochanteric region of femur extending into the proximal diaphysis with 7 mm of medial displacement. Electronically Signed   By: Kathreen Devoid M.D.    On: 09/17/2021 13:11    Microbiology: Results for orders placed or performed during the hospital encounter of 09/18/21  Culture, blood (routine x 2)     Status: None (Preliminary result)   Collection Time: 09/19/21  8:31 AM   Specimen: BLOOD  Result Value Ref Range Status   Specimen Description   Final    BLOOD RIGHT ANTECUBITAL Performed at [facility] [address], [place], [place] [address]    Special Requests   Final    BOTTLES DRAWN AEROBIC ONLY Blood Culture adequate volume Performed at [facility] [address] [place], [place] [address]    Culture  Final    NO GROWTH 4 DAYS Performed at [facility], [place] [address], [place], [place] [address]    Report Status PENDING  Incomplete  Culture, blood (routine x 2)     Status: None (Preliminary result)   Collection Time: 09/19/21  8:42 AM   Specimen: BLOOD  Result Value Ref Range Status   Specimen Description   Final    BLOOD RIGHT ANTECUBITAL Performed at [facility] [address] [place], [place] [address]    Special Requests   Final    BOTTLES DRAWN AEROBIC ONLY Blood Culture adequate volume Performed at [facility] [address] [place], [place] [address]    Culture   Final    NO GROWTH 4 DAYS Performed at [facility], [place] [address], [place], [place] [address]    Report Status PENDING  Incomplete    Labs: CBC: Recent Labs  Lab 09/17/21 1220 09/18/21 1905 09/19/21 0219 09/20/21 0527 09/21/21 1148 09/22/21 0410  WBC 6.7 21.4*   24.2* 18.7* 10.9* 7.6  --   NEUTROABS 4.8 18.6*  --  9.6*  --   --   HGB 10.7* 12.3   12.3 11.0* 8.4* 7.1* 7.9*  HCT 35.9* 39.4   38.7 33.4* 26.1* 22.6* 24.6*  MCV 94.2 90.6   90.8 85.9 89.4 90.8  --   PLT 228 174   188 176 122* 122*  --    Basic Metabolic Panel: Recent Labs  Lab 09/17/21 1220 09/18/21 1905 09/19/21 0219 09/20/21 0359  NA 135 131* 130* 135  K 4.1 5.0 4.5  3.7  CL 107 104 105 110  CO2 23 18* 17* 20*  GLUCOSE 94 201* 167* 96  BUN 31* 24* 27* 21  CREATININE 1.27* 0.73 0.84 0.87  CALCIUM 9.2 7.9* 7.7* 8.0*   Liver Function Tests: Recent Labs  Lab 09/18/21 1905  AST 22  ALT 14  ALKPHOS 40  BILITOT 1.9*  PROT 4.9*  ALBUMIN 2.8*   CBG: Recent Labs  Lab 09/19/21 0151 09/19/21 0526  GLUCAP 153* 178*    Discharge time spent: 20 minutes.  Signed: Edwin Dada, MD [facility] 09/24/2021

## 2021-09-24 NOTE — TOC Progression Note (Signed)
Transition of Care (TOC) - Progression Note  ? ? ?Patient Details  ?Name: [name] [name] ?MRN: [id number] ?Date of Birth: [birth year] ? ?Transition of Care (TOC) CM/SW Contact  ?Geni Bers, RN ?Phone Number: ?09/24/2021, 9:41 AM ? ?Clinical Narrative:    ?A call to SNF was made, waiting for insurance authorization.  ? ? ?  ?  ? ?Expected Discharge Plan and Services ?  ?  ?  ?  ?  ?                ?  ?  ?  ?  ?  ?  ?  ?  ?  ?  ? ? ?Social Determinants of Health (SDOH) Interventions ?  ? ?Readmission Risk Interventions ?No flowsheet data found. ? ?

## 2021-09-24 NOTE — Consult Note (Signed)
[facility] CM Inpatient Consult ? ? ?09/24/2021 ? ?[name] [name] ?[birth year] ?[id number] ? ?[facility] CM) ?  ?Patient chart reviewed for less than 30 days unplanned readmission and noted high risk score for unplanned readmission. Assessed for post hospital chronic care coordination and chronic disease care management services. ?  ?Patient is being recommended for SNF level of care for transition. No [facility] CM needs will be needed as patient needs will be met at SNF level of care. ?  ?Plan: Will sign off if patient goes to SNF. ? ?Of note, [facility] Care Management services does not replace or interfere with any services that are arranged by inpatient case management or social work.  ? ?Netta Cedars, MSN, RN ?[facility] Liaison ?Mobile Phone [phone]  ?Toll free office [phone]  ? ? ?

## 2021-09-24 NOTE — TOC Progression Note (Signed)
Transition of Care (TOC) - Progression Note  ? ? ?Patient Details  ?Name: Sueann S Ohlson ?MRN: [id number] ?Date of Birth: Aug 12, 1945 ? ?Transition of Care (TOC) CM/SW Contact  ?Purcell Mouton, RN ?Phone Number: ?09/24/2021, 1:14 PM ? ?Clinical Narrative:    ? ?Called Pt's husband and son Mitzi Hansen to inform them that pt was transferring to [facility].  ? ?  ?  ? ?Expected Discharge Plan and Services ?  ?  ?  ?  ?  ?Expected Discharge Date: 09/24/21               ?  ?  ?  ?  ?  ?  ?  ?  ?  ?  ? ? ?Social Determinants of Health (SDOH) Interventions ?  ? ?Readmission Risk Interventions ?No flowsheet data found. ? ?

## 2021-09-27 DIAGNOSIS — R569 Unspecified convulsions: Secondary | ICD-10-CM | POA: Diagnosis not present

## 2021-09-27 DIAGNOSIS — S72001A Fracture of unspecified part of neck of right femur, initial encounter for closed fracture: Secondary | ICD-10-CM | POA: Diagnosis not present

## 2021-09-27 DIAGNOSIS — K754 Autoimmune hepatitis: Secondary | ICD-10-CM | POA: Diagnosis not present

## 2021-09-27 DIAGNOSIS — I10 Essential (primary) hypertension: Secondary | ICD-10-CM | POA: Diagnosis not present

## 2021-09-29 DIAGNOSIS — D509 Iron deficiency anemia, unspecified: Secondary | ICD-10-CM | POA: Diagnosis not present

## 2021-09-29 DIAGNOSIS — L22 Diaper dermatitis: Secondary | ICD-10-CM | POA: Diagnosis not present

## 2021-09-29 DIAGNOSIS — R569 Unspecified convulsions: Secondary | ICD-10-CM | POA: Diagnosis not present

## 2021-09-29 DIAGNOSIS — S72001A Fracture of unspecified part of neck of right femur, initial encounter for closed fracture: Secondary | ICD-10-CM | POA: Diagnosis not present

## 2021-09-29 DIAGNOSIS — L89150 Pressure ulcer of sacral region, unstageable: Secondary | ICD-10-CM | POA: Diagnosis not present

## 2021-09-29 DIAGNOSIS — K754 Autoimmune hepatitis: Secondary | ICD-10-CM | POA: Diagnosis not present

## 2021-09-30 ENCOUNTER — Encounter: Payer: Self-pay | Admitting: Oncology

## 2021-09-30 ENCOUNTER — Encounter: Payer: Self-pay | Admitting: Neurology

## 2021-09-30 ENCOUNTER — Other Ambulatory Visit: Payer: Self-pay

## 2021-09-30 ENCOUNTER — Ambulatory Visit: Payer: Medicare PPO | Admitting: Neurology

## 2021-09-30 VITALS — BP 108/73 | HR 71

## 2021-09-30 DIAGNOSIS — M978XXD Periprosthetic fracture around other internal prosthetic joint, subsequent encounter: Secondary | ICD-10-CM | POA: Diagnosis not present

## 2021-09-30 DIAGNOSIS — Z96649 Presence of unspecified artificial hip joint: Secondary | ICD-10-CM | POA: Diagnosis not present

## 2021-09-30 DIAGNOSIS — R569 Unspecified convulsions: Secondary | ICD-10-CM | POA: Diagnosis not present

## 2021-09-30 NOTE — Progress Notes (Signed)
? ?[facility] ? ?PATIENT: [name] [name] ?DOB: 1945/09/13 ? ?REQUESTING CLINICIAN: Danford, Suann Larry,* ?HISTORY FROM: Patient and chart review  ?REASON FOR VISIT: Seizure like activity ? ? ?HISTORICAL ? ?CHIEF COMPLAINT:  ?Chief Complaint  ?Patient presents with  ? New Patient (Initial Visit)  ?  Rm 14, with daughter in law, ?Reports no new symptoms, states she is stable, pt did fall out of bed last night   ? ? ?HISTORY OF PRESENT ILLNESS:  ?This is a 76 year old woman past medical history of autoimmune hepatitis, anemia, CKD stage III, recent hip fracture status post surgery x2 who is presenting after seizure-like activity noted in the hospital following surgery. Marland Kitchen ?Patient initially presented to the hospital on February 3 after mechanical fall, she was found to have a hip fracture and had a right hip Hemiarthroplasty.  She was discharged to rehab, was doing well and discharged home.  On March 3 she had a second mechanical fall at home, has fracture of the same hip which required additional surgery.  Status post surgery she was noted to have seizure-like activity.  There was description of left hand jerking and left shoulder,  Patient was given muscle relaxant.  In the morning of March 5 she had another event of whole body jerking.  And around 7 AM she was found to have another short episode of body jerking.  She was started on levetiracetam.  She did have MRI brain and EEG which shows continuous slowing.  Levetiracetam was discontinued.  She denies any previous history of seizure, denies any family history of seizures and no reported seizure risk factors. ? ?Since leaving the hospital patient has been staying at a nursing facility.  She is getting physical and Occupational Therapy she denies any seizure-like activity but unfortunately last night she fell on the ground, and she had to call for help.  She was not taken to the hospital and on exam today, she is noted to have a right  forehead bruise.  She denies any headaches denies any weakness. ? ?OTHER MEDICAL CONDITIONS: Autoimmune hepatitis, anemia, CKD 3a, recent right hip fractureX2  ? ? ?REVIEW OF SYSTEMS: Full 14 system review of systems performed and negative with exception of: as noted in the HPI.  ? ?ALLERGIES: ?Allergies  ?Allergen Reactions  ? Sulfa Antibiotics Nausea And Vomiting  ?  Pt has not had a sulfa drug in years and is not certain of reaction but suspects nausea and vomiting.  ? Sulfasalazine Nausea And Vomiting  ? ? ?HOME MEDICATIONS: ?Outpatient Medications Prior to Visit  ?Medication Sig Dispense Refill  ? acetaminophen (TYLENOL) 325 MG tablet Take 2 tablets (650 mg total) by mouth every 6 (six) hours as needed for mild pain or moderate pain (pain score 1-3 or temp > 100.5).    ? aspirin 81 MG chewable tablet Chew 1 tablet (81 mg total) by mouth 2 (two) times daily.    ? azaTHIOprine (IMURAN) 50 MG tablet Take 25 mg by mouth daily.  0  ? Calcium Carbonate Antacid 600 MG chewable tablet Chew 600 mg by mouth.    ? calcium citrate-vitamin D (CITRACAL+D) 315-200 MG-UNIT tablet Take 1 tablet by mouth 2 (two) times daily.    ? cyanocobalamin 100 MCG tablet Take 100 mcg by mouth daily.    ? estradiol (ESTRACE) 0.1 MG/GM vaginal cream Place 1 Applicatorful vaginally 3 (three) times a week.    ? feeding supplement (ENSURE ENLIVE / ENSURE PLUS) LIQD Take 237 mLs by  mouth 2 (two) times daily between meals. 14220 mL 0  ? Ferrous Fumarate (HEMOCYTE - 106 MG FE) 324 (106 Fe) MG TABS tablet Take 1 tablet by mouth daily.    ? HYDROcodone-acetaminophen (NORCO/VICODIN) 5-325 MG tablet Take 1 tablet by mouth every 6 (six) hours as needed for up to 7 days for severe pain. 10 tablet 0  ? metoprolol tartrate (LOPRESSOR) 50 MG tablet Take 50 mg by mouth daily.    ? Misc. Devices (WALKER) MISC 1 Device by Does not apply route as directed. 1 each 0  ? Multiple Vitamin (MULTIVITAMIN WITH MINERALS) TABS tablet Take 1 tablet by mouth daily. 30  tablet 0  ? omeprazole (PRILOSEC) 40 MG capsule Take 40 mg by mouth daily.    ? polyethylene glycol (MIRALAX / GLYCOLAX) 17 g packet Take 17 g by mouth daily as needed for mild constipation. 30 each 0  ? predniSONE (DELTASONE) 5 MG tablet Take 7.5 mg by mouth daily.  3  ? potassium chloride (KLOR-CON) 10 MEQ tablet Take 10 mEq by mouth daily.    ? ?No facility-administered medications prior to visit.  ? ? ?PAST MEDICAL HISTORY: ?Past Medical History:  ?Diagnosis Date  ? Abnormal LFTs (liver function tests)   ? Autoimmune hepatitis ([place]) 2018  ? Autoimmune hepatitis ([place])   ? Autoimmune hepatitis treated with steroids ([place])   ? DOE (dyspnea on exertion)   ? GERD (gastroesophageal reflux disease)   ? Hepatitis   ? Hyperlipidemia   ? Hypertension   ? Iron deficiency anemia due to chronic blood loss 05/27/2021  ? Osteoporosis   ? ? ?PAST SURGICAL HISTORY: ?Past Surgical History:  ?Procedure Laterality Date  ? ABDOMINAL HYSTERECTOMY    ? COLONOSCOPY  10/06/2015  ? COLONOSCOPY WITH PROPOFOL N/A 08/27/2018  ? Procedure: COLONOSCOPY WITH PROPOFOL;  Surgeon: Manya Silvas, MD;  Location: [facility] ENDOSCOPY;  Service: Endoscopy;  Laterality: N/A;  ? ESOPHAGOGASTRODUODENOSCOPY (EGD) WITH PROPOFOL N/A 08/27/2018  ? Procedure: ESOPHAGOGASTRODUODENOSCOPY (EGD) WITH PROPOFOL;  Surgeon: Manya Silvas, MD;  Location: [facility] ENDOSCOPY;  Service: Endoscopy;  Laterality: N/A;  ? ESOPHAGOGASTRODUODENOSCOPY (EGD) WITH PROPOFOL N/A 05/21/2021  ? Procedure: ESOPHAGOGASTRODUODENOSCOPY (EGD) WITH PROPOFOL;  Surgeon: Lesly Rubenstein, MD;  Location: [facility] ENDOSCOPY;  Service: Endoscopy;  Laterality: N/A;  ? HIP ARTHROPLASTY Right 08/20/2021  ? Procedure: ARTHROPLASTY BIPOLAR HIP (HEMIARTHROPLASTY);  Surgeon: Thornton Park, MD;  Location: [facility] ORS;  Service: Orthopedics;  Laterality: Right;  ? LIVER BIOPSY    ? TOTAL HIP REVISION Right 09/18/2021  ? Procedure: FEMORAL COMPONENT REVISION CONVERSION TO TOTAL HIP ARTHROPLASTY;  Surgeon: Rod Can, MD;  Location: [facility] ORS;  Service: Orthopedics;  Laterality: Right;  ? ? ?FAMILY HISTORY: ?Family History  ?Problem Relation Age of Onset  ? Cancer Mother   ? ? ?SOCIAL HISTORY: ?Social History  ? ?Socioeconomic History  ? Marital status: Married  ?  Spouse name: Not on file  ? Number of children: Not on file  ? Years of education: Not on file  ? Highest education level: Not on file  ?Occupational History  ? Not on file  ?Tobacco Use  ? Smoking status: Never  ? Smokeless tobacco: Never  ?Vaping Use  ? Vaping Use: Never used  ?Substance and Sexual Activity  ? Alcohol use: No  ? Drug use: Never  ? Sexual activity: Not Currently  ?  Birth control/protection: Post-menopausal  ?Other Topics Concern  ? Not on file  ?Social History Narrative  ? Not on file  ? ?  Social Determinants of Health  ? ?Financial Resource Strain: Not on file  ?Food Insecurity: Not on file  ?Transportation Needs: Not on file  ?Physical Activity: Not on file  ?Stress: Not on file  ?Social Connections: Not on file  ?Intimate Partner Violence: Not on file  ? ? ?PHYSICAL EXAM ? ?GENERAL EXAM/CONSTITUTIONAL: ?Vitals:  ?Vitals:  ? 09/30/21 1015  ?BP: 108/73  ?Pulse: 71  ? ?There is no height or weight on file to calculate BMI. ?Wt Readings from Last 3 Encounters:  ?09/17/21 130 lb (59 kg)  ?09/15/21 127 lb (57.6 kg)  ?08/19/21 127 lb (57.6 kg)  ? ?Patient is in no distress; well developed, nourished and groomed; neck is supple. Right forehead bruise about 7 cm diameter.  ? ?CARDIOVASCULAR: ?Examination of carotid arteries is normal; no carotid bruits ?Regular rate and rhythm, no murmurs ?Examination of peripheral vascular system by observation and palpation is normal ? ?EYES: ?Pupils round and reactive to light, Visual fields full to confrontation, Extraocular movements intacts,  ? ?MUSCULOSKELETAL: ?Gait, strength, tone, movements noted in Neurologic exam below ? ?NEUROLOGIC: ?MENTAL STATUS:  ?No flowsheet data found. ?awake, alert, oriented to  person, place and time ?recent and remote memory intact ?normal attention and concentration ?language fluent, comprehension intact, naming intact ?fund of knowledge appropriate ? ?CRANIAL NERVE:  ?2nd, 3rd, 4th, 6t

## 2021-09-30 NOTE — Patient Instructions (Signed)
Continue current medications  ?Continue physical and occupation therapies  ?Follow up with your primary care physician  ?Return if worse  ?

## 2021-10-01 DIAGNOSIS — M6281 Muscle weakness (generalized): Secondary | ICD-10-CM | POA: Diagnosis not present

## 2021-10-01 DIAGNOSIS — G8911 Acute pain due to trauma: Secondary | ICD-10-CM | POA: Diagnosis not present

## 2021-10-01 DIAGNOSIS — M9701XA Periprosthetic fracture around internal prosthetic right hip joint, initial encounter: Secondary | ICD-10-CM | POA: Diagnosis not present

## 2021-10-01 DIAGNOSIS — K754 Autoimmune hepatitis: Secondary | ICD-10-CM | POA: Diagnosis not present

## 2021-10-01 DIAGNOSIS — M80051D Age-related osteoporosis with current pathological fracture, right femur, subsequent encounter for fracture with routine healing: Secondary | ICD-10-CM | POA: Diagnosis not present

## 2021-10-04 DIAGNOSIS — S72001A Fracture of unspecified part of neck of right femur, initial encounter for closed fracture: Secondary | ICD-10-CM | POA: Diagnosis not present

## 2021-10-04 DIAGNOSIS — L89159 Pressure ulcer of sacral region, unspecified stage: Secondary | ICD-10-CM | POA: Diagnosis not present

## 2021-10-04 DIAGNOSIS — R569 Unspecified convulsions: Secondary | ICD-10-CM | POA: Diagnosis not present

## 2021-10-04 DIAGNOSIS — R079 Chest pain, unspecified: Secondary | ICD-10-CM | POA: Diagnosis not present

## 2021-10-06 DIAGNOSIS — S72001A Fracture of unspecified part of neck of right femur, initial encounter for closed fracture: Secondary | ICD-10-CM | POA: Diagnosis not present

## 2021-10-06 DIAGNOSIS — R079 Chest pain, unspecified: Secondary | ICD-10-CM | POA: Diagnosis not present

## 2021-10-06 DIAGNOSIS — L89159 Pressure ulcer of sacral region, unspecified stage: Secondary | ICD-10-CM | POA: Diagnosis not present

## 2021-10-06 DIAGNOSIS — I10 Essential (primary) hypertension: Secondary | ICD-10-CM | POA: Diagnosis not present

## 2021-10-07 DIAGNOSIS — R079 Chest pain, unspecified: Secondary | ICD-10-CM | POA: Diagnosis not present

## 2021-10-07 DIAGNOSIS — S72001A Fracture of unspecified part of neck of right femur, initial encounter for closed fracture: Secondary | ICD-10-CM | POA: Diagnosis not present

## 2021-10-07 DIAGNOSIS — R0600 Dyspnea, unspecified: Secondary | ICD-10-CM | POA: Diagnosis not present

## 2021-10-07 DIAGNOSIS — L89159 Pressure ulcer of sacral region, unspecified stage: Secondary | ICD-10-CM | POA: Diagnosis not present

## 2021-10-11 DIAGNOSIS — L89159 Pressure ulcer of sacral region, unspecified stage: Secondary | ICD-10-CM | POA: Diagnosis not present

## 2021-10-11 DIAGNOSIS — S72001A Fracture of unspecified part of neck of right femur, initial encounter for closed fracture: Secondary | ICD-10-CM | POA: Diagnosis not present

## 2021-10-12 DIAGNOSIS — L89159 Pressure ulcer of sacral region, unspecified stage: Secondary | ICD-10-CM | POA: Diagnosis not present

## 2021-10-12 DIAGNOSIS — S72001A Fracture of unspecified part of neck of right femur, initial encounter for closed fracture: Secondary | ICD-10-CM | POA: Diagnosis not present

## 2021-10-14 DIAGNOSIS — L89159 Pressure ulcer of sacral region, unspecified stage: Secondary | ICD-10-CM | POA: Diagnosis not present

## 2021-10-14 DIAGNOSIS — K754 Autoimmune hepatitis: Secondary | ICD-10-CM | POA: Diagnosis not present

## 2021-10-14 DIAGNOSIS — S72001A Fracture of unspecified part of neck of right femur, initial encounter for closed fracture: Secondary | ICD-10-CM | POA: Diagnosis not present

## 2021-10-18 NOTE — Progress Notes (Deleted)
? ? ?10/19/2021 ?8:35 AM  ? ?[name] [name] ?1946/04/25 ?[id number] ? ?Referring provider: Margaretann Loveless, MD ?[address] ?[place],  [place] [address] ? ?No chief complaint on file. ? ?Urological history: ?1. Renal cysts ?-contrast CT 07/2021 - stable small bilateral renal cysts ? ?2. Urinary retention ?-occurred after right hip replacement 08/2021 ? ?3. Renal cysts  ?-CT 2023 - small bilateral renal cysts  ? ?HPI: ?[name] [name] is a 76 y.o. female who presents today for a one month follow up.  ? ?PVR *** ? ? ?PMH: ?Past Medical History:  ?Diagnosis Date  ? Abnormal LFTs (liver function tests)   ? Autoimmune hepatitis ([facility]) 2018  ? Autoimmune hepatitis ([facility])   ? Autoimmune hepatitis treated with steroids ([facility])   ? DOE (dyspnea on exertion)   ? GERD (gastroesophageal reflux disease)   ? Hepatitis   ? Hyperlipidemia   ? Hypertension   ? Iron deficiency anemia due to chronic blood loss 05/27/2021  ? Osteoporosis   ? ? ?Surgical History: ?Past Surgical History:  ?Procedure Laterality Date  ? ABDOMINAL HYSTERECTOMY    ? COLONOSCOPY  10/06/2015  ? COLONOSCOPY WITH PROPOFOL N/A 08/27/2018  ? Procedure: COLONOSCOPY WITH PROPOFOL;  Surgeon: Scot Jun, MD;  Location: [facility] ENDOSCOPY;  Service: Endoscopy;  Laterality: N/A;  ? ESOPHAGOGASTRODUODENOSCOPY (EGD) WITH PROPOFOL N/A 08/27/2018  ? Procedure: ESOPHAGOGASTRODUODENOSCOPY (EGD) WITH PROPOFOL;  Surgeon: Scot Jun, MD;  Location: [facility] ENDOSCOPY;  Service: Endoscopy;  Laterality: N/A;  ? ESOPHAGOGASTRODUODENOSCOPY (EGD) WITH PROPOFOL N/A 05/21/2021  ? Procedure: ESOPHAGOGASTRODUODENOSCOPY (EGD) WITH PROPOFOL;  Surgeon: Regis Bill, MD;  Location: [facility] ENDOSCOPY;  Service: Endoscopy;  Laterality: N/A;  ? HIP ARTHROPLASTY Right 08/20/2021  ? Procedure: ARTHROPLASTY BIPOLAR HIP (HEMIARTHROPLASTY);  Surgeon: Juanell Fairly, MD;  Location: [facility] ORS;  Service: Orthopedics;  Laterality: Right;  ? LIVER BIOPSY    ? TOTAL HIP REVISION Right 09/18/2021  ?  Procedure: FEMORAL COMPONENT REVISION CONVERSION TO TOTAL HIP ARTHROPLASTY;  Surgeon: Samson Frederic, MD;  Location: [facility] ORS;  Service: Orthopedics;  Laterality: Right;  ? ? ?Home Medications:  ?Allergies as of 10/19/2021   ? ?   Reactions  ? Sulfa Antibiotics Nausea And Vomiting  ? Pt has not had a sulfa drug in years and is not certain of reaction but suspects nausea and vomiting.  ? Sulfasalazine Nausea And Vomiting  ? ?  ? ?  ?Medication List  ?  ? ?  ? Accurate as of October 18, 2021  8:35 AM. If you have any questions, ask your nurse or doctor.  ?  ?  ? ?  ? ?acetaminophen 325 MG tablet ?Commonly known as: TYLENOL ?Take 2 tablets (650 mg total) by mouth every 6 (six) hours as needed for mild pain or moderate pain (pain score 1-3 or temp > 100.5). ?  ?aspirin 81 MG chewable tablet ?Chew 1 tablet (81 mg total) by mouth 2 (two) times daily. ?  ?azaTHIOprine 50 MG tablet ?Commonly known as: IMURAN ?Take 25 mg by mouth daily. ?  ?Calcium Carbonate Antacid 600 MG chewable tablet ?Chew 600 mg by mouth. ?  ?calcium citrate-vitamin D 315-200 MG-UNIT tablet ?Commonly known as: CITRACAL+D ?Take 1 tablet by mouth 2 (two) times daily. ?  ?cyanocobalamin 100 MCG tablet ?Take 100 mcg by mouth daily. ?  ?estradiol 0.1 MG/GM vaginal cream ?Commonly known as: ESTRACE ?Place 1 Applicatorful vaginally 3 (three) times a week. ?  ?feeding supplement Liqd ?Take 237 mLs by mouth 2 (two) times  daily between meals. ?  ?Ferrous Fumarate 324 (106 Fe) MG Tabs tablet ?Commonly known as: HEMOCYTE - 106 mg FE ?Take 1 tablet by mouth daily. ?  ?metoprolol tartrate 50 MG tablet ?Commonly known as: LOPRESSOR ?Take 50 mg by mouth daily. ?  ?multivitamin with minerals Tabs tablet ?Take 1 tablet by mouth daily. ?  ?omeprazole 40 MG capsule ?Commonly known as: PRILOSEC ?Take 40 mg by mouth daily. ?  ?polyethylene glycol 17 g packet ?Commonly known as: MIRALAX / GLYCOLAX ?Take 17 g by mouth daily as needed for mild constipation. ?  ?potassium chloride 10  MEQ tablet ?Commonly known as: KLOR-CON ?Take 10 mEq by mouth daily. ?  ?predniSONE 5 MG tablet ?Commonly known as: DELTASONE ?Take 7.5 mg by mouth daily. ?  ?Walker Misc ?1 Device by Does not apply route as directed. ?  ? ?  ? ? ?Allergies:  ?Allergies  ?Allergen Reactions  ? Sulfa Antibiotics Nausea And Vomiting  ?  Pt has not had a sulfa drug in years and is not certain of reaction but suspects nausea and vomiting.  ? Sulfasalazine Nausea And Vomiting  ? ? ?Family History: ?Family History  ?Problem Relation Age of Onset  ? Cancer Mother   ? ? ?Social History:  reports that she has never smoked. She has never used smokeless tobacco. She reports that she does not drink alcohol and does not use drugs. ? ?ROS: ?Pertinent ROS in HPI ? ?Physical Exam: ?There were no vitals taken for this visit.  ?Constitutional:  Well nourished. Alert and oriented, No acute distress. ?HEENT: [place] AT, moist mucus membranes.  Trachea midline, no masses. ?Cardiovascular: No clubbing, cyanosis, or edema. ?Respiratory: Normal respiratory effort, no increased work of breathing. ?GI: Abdomen is soft, non tender, non distended, no abdominal masses. Liver and spleen not palpable.  No hernias appreciated.  Stool sample for occult testing is not indicated.   ?GU: No CVA tenderness.  No bladder fullness or masses.  *** external genitalia, *** pubic hair distribution, no lesions.  Normal urethral meatus, no lesions, no prolapse, no discharge.   No urethral masses, tenderness and/or tenderness. No bladder fullness, tenderness or masses. *** vagina mucosa, *** estrogen effect, no discharge, no lesions, *** pelvic support, *** cystocele and *** rectocele noted.  No cervical motion tenderness.  Uterus is freely mobile and non-fixed.  No adnexal/parametria masses or tenderness noted.  Anus and perineum are without rashes or lesions.   ***  ?Skin: No rashes, bruises or suspicious lesions. ?Lymph: No cervical or inguinal adenopathy. ?Neurologic: Grossly  intact, no focal deficits, moving all 4 extremities. ?Psychiatric: Normal mood and affect.   ? ?Laboratory Data: ?N/A ? ?Pertinent Imaging: ?*** ? ?Assessment & Plan:   ? ?1. Urinary retention ?-*** ? ?No follow-ups on file. ? ?These notes generated with voice recognition software. I apologize for typographical errors. ? ?Carisa Backhaus, PA-C ? ?[facility] ?[address]  [address] ?[place], [place] [address] ?[phone] ?  ?

## 2021-10-19 ENCOUNTER — Ambulatory Visit: Payer: Medicare PPO | Admitting: Urology

## 2021-10-19 DIAGNOSIS — I10 Essential (primary) hypertension: Secondary | ICD-10-CM | POA: Diagnosis not present

## 2021-10-19 DIAGNOSIS — R338 Other retention of urine: Secondary | ICD-10-CM

## 2021-10-19 DIAGNOSIS — L89159 Pressure ulcer of sacral region, unspecified stage: Secondary | ICD-10-CM | POA: Diagnosis not present

## 2021-10-19 DIAGNOSIS — S72001A Fracture of unspecified part of neck of right femur, initial encounter for closed fracture: Secondary | ICD-10-CM | POA: Diagnosis not present

## 2021-10-20 ENCOUNTER — Encounter: Payer: Self-pay | Admitting: Urology

## 2021-10-25 ENCOUNTER — Inpatient Hospital Stay: Payer: Medicare PPO | Attending: Oncology

## 2021-10-25 DIAGNOSIS — M6281 Muscle weakness (generalized): Secondary | ICD-10-CM | POA: Diagnosis not present

## 2021-10-25 DIAGNOSIS — R112 Nausea with vomiting, unspecified: Secondary | ICD-10-CM | POA: Diagnosis not present

## 2021-10-25 DIAGNOSIS — M80051D Age-related osteoporosis with current pathological fracture, right femur, subsequent encounter for fracture with routine healing: Secondary | ICD-10-CM | POA: Diagnosis not present

## 2021-10-25 DIAGNOSIS — G8911 Acute pain due to trauma: Secondary | ICD-10-CM | POA: Diagnosis not present

## 2021-10-26 ENCOUNTER — Inpatient Hospital Stay: Payer: Medicare PPO

## 2021-10-26 ENCOUNTER — Inpatient Hospital Stay: Payer: Medicare PPO | Admitting: Oncology

## 2021-10-27 DIAGNOSIS — I10 Essential (primary) hypertension: Secondary | ICD-10-CM | POA: Diagnosis not present

## 2021-10-27 DIAGNOSIS — S72001A Fracture of unspecified part of neck of right femur, initial encounter for closed fracture: Secondary | ICD-10-CM | POA: Diagnosis not present

## 2021-10-27 DIAGNOSIS — L89159 Pressure ulcer of sacral region, unspecified stage: Secondary | ICD-10-CM | POA: Diagnosis not present

## 2021-10-29 DIAGNOSIS — G8911 Acute pain due to trauma: Secondary | ICD-10-CM | POA: Diagnosis not present

## 2021-10-29 DIAGNOSIS — K754 Autoimmune hepatitis: Secondary | ICD-10-CM | POA: Diagnosis not present

## 2021-10-29 DIAGNOSIS — S72001A Fracture of unspecified part of neck of right femur, initial encounter for closed fracture: Secondary | ICD-10-CM | POA: Diagnosis not present

## 2021-10-29 DIAGNOSIS — M6281 Muscle weakness (generalized): Secondary | ICD-10-CM | POA: Diagnosis not present

## 2021-11-03 ENCOUNTER — Other Ambulatory Visit: Payer: Self-pay

## 2021-11-03 ENCOUNTER — Emergency Department: Payer: Medicare PPO

## 2021-11-03 ENCOUNTER — Encounter: Payer: Self-pay | Admitting: Emergency Medicine

## 2021-11-03 ENCOUNTER — Inpatient Hospital Stay
Admission: EM | Admit: 2021-11-03 | Discharge: 2021-11-08 | DRG: 271 | Disposition: A | Discharge status: Skilled Nursing Facility | Payer: Medicare PPO | Attending: Internal Medicine | Admitting: Internal Medicine

## 2021-11-03 DIAGNOSIS — N183 Chronic kidney disease, stage 3 unspecified: Secondary | ICD-10-CM | POA: Diagnosis not present

## 2021-11-03 DIAGNOSIS — I82409 Acute embolism and thrombosis of unspecified deep veins of unspecified lower extremity: Secondary | ICD-10-CM | POA: Diagnosis present

## 2021-11-03 DIAGNOSIS — I82411 Acute embolism and thrombosis of right femoral vein: Secondary | ICD-10-CM | POA: Diagnosis not present

## 2021-11-03 DIAGNOSIS — Z993 Dependence on wheelchair: Secondary | ICD-10-CM | POA: Diagnosis not present

## 2021-11-03 DIAGNOSIS — I82431 Acute embolism and thrombosis of right popliteal vein: Secondary | ICD-10-CM | POA: Diagnosis not present

## 2021-11-03 DIAGNOSIS — I82413 Acute embolism and thrombosis of femoral vein, bilateral: Secondary | ICD-10-CM

## 2021-11-03 DIAGNOSIS — Z7982 Long term (current) use of aspirin: Secondary | ICD-10-CM

## 2021-11-03 DIAGNOSIS — Z96641 Presence of right artificial hip joint: Secondary | ICD-10-CM | POA: Diagnosis present

## 2021-11-03 DIAGNOSIS — L89152 Pressure ulcer of sacral region, stage 2: Secondary | ICD-10-CM | POA: Diagnosis present

## 2021-11-03 DIAGNOSIS — Z66 Do not resuscitate: Secondary | ICD-10-CM | POA: Diagnosis present

## 2021-11-03 DIAGNOSIS — R0902 Hypoxemia: Secondary | ICD-10-CM | POA: Diagnosis not present

## 2021-11-03 DIAGNOSIS — E785 Hyperlipidemia, unspecified: Secondary | ICD-10-CM | POA: Diagnosis present

## 2021-11-03 DIAGNOSIS — N179 Acute kidney failure, unspecified: Secondary | ICD-10-CM | POA: Diagnosis not present

## 2021-11-03 DIAGNOSIS — I824Y2 Acute embolism and thrombosis of unspecified deep veins of left proximal lower extremity: Secondary | ICD-10-CM | POA: Diagnosis not present

## 2021-11-03 DIAGNOSIS — M810 Age-related osteoporosis without current pathological fracture: Secondary | ICD-10-CM | POA: Diagnosis present

## 2021-11-03 DIAGNOSIS — I82512 Chronic embolism and thrombosis of left femoral vein: Secondary | ICD-10-CM | POA: Diagnosis not present

## 2021-11-03 DIAGNOSIS — K754 Autoimmune hepatitis: Secondary | ICD-10-CM | POA: Diagnosis present

## 2021-11-03 DIAGNOSIS — I129 Hypertensive chronic kidney disease with stage 1 through stage 4 chronic kidney disease, or unspecified chronic kidney disease: Secondary | ICD-10-CM | POA: Diagnosis present

## 2021-11-03 DIAGNOSIS — Z809 Family history of malignant neoplasm, unspecified: Secondary | ICD-10-CM

## 2021-11-03 DIAGNOSIS — I82503 Chronic embolism and thrombosis of unspecified deep veins of lower extremity, bilateral: Secondary | ICD-10-CM | POA: Diagnosis not present

## 2021-11-03 DIAGNOSIS — Z7952 Long term (current) use of systemic steroids: Secondary | ICD-10-CM | POA: Diagnosis not present

## 2021-11-03 DIAGNOSIS — Z7401 Bed confinement status: Secondary | ICD-10-CM | POA: Diagnosis not present

## 2021-11-03 DIAGNOSIS — K219 Gastro-esophageal reflux disease without esophagitis: Secondary | ICD-10-CM | POA: Diagnosis not present

## 2021-11-03 DIAGNOSIS — I824Y3 Acute embolism and thrombosis of unspecified deep veins of proximal lower extremity, bilateral: Principal | ICD-10-CM

## 2021-11-03 DIAGNOSIS — I871 Compression of vein: Secondary | ICD-10-CM | POA: Diagnosis not present

## 2021-11-03 DIAGNOSIS — I82412 Acute embolism and thrombosis of left femoral vein: Secondary | ICD-10-CM | POA: Diagnosis not present

## 2021-11-03 DIAGNOSIS — M7989 Other specified soft tissue disorders: Secondary | ICD-10-CM | POA: Diagnosis not present

## 2021-11-03 DIAGNOSIS — M79662 Pain in left lower leg: Secondary | ICD-10-CM | POA: Diagnosis not present

## 2021-11-03 DIAGNOSIS — I82532 Chronic embolism and thrombosis of left popliteal vein: Secondary | ICD-10-CM | POA: Diagnosis not present

## 2021-11-03 LAB — CBC WITH DIFFERENTIAL/PLATELET
Abs Immature Granulocytes: 0.06 10*3/uL (ref 0.00–0.07)
Basophils Absolute: 0 10*3/uL (ref 0.0–0.1)
Basophils Relative: 0 %
Eosinophils Absolute: 0.1 10*3/uL (ref 0.0–0.5)
Eosinophils Relative: 1 %
HCT: 43.6 % (ref 36.0–46.0)
Hemoglobin: 13.3 g/dL (ref 12.0–15.0)
Immature Granulocytes: 1 %
Lymphocytes Relative: 6 %
Lymphs Abs: 0.8 10*3/uL (ref 0.7–4.0)
MCH: 26.9 pg (ref 26.0–34.0)
MCHC: 30.5 g/dL (ref 30.0–36.0)
MCV: 88.3 fL (ref 80.0–100.0)
Monocytes Absolute: 0.8 10*3/uL (ref 0.1–1.0)
Monocytes Relative: 7 %
Neutro Abs: 10.4 10*3/uL — ABNORMAL HIGH (ref 1.7–7.7)
Neutrophils Relative %: 85 %
Platelets: 331 10*3/uL (ref 150–400)
RBC: 4.94 MIL/uL (ref 3.87–5.11)
RDW: 16.6 % — ABNORMAL HIGH (ref 11.5–15.5)
WBC: 12.2 10*3/uL — ABNORMAL HIGH (ref 4.0–10.5)
nRBC: 0 % (ref 0.0–0.2)

## 2021-11-03 LAB — BASIC METABOLIC PANEL
Anion gap: 13 (ref 5–15)
BUN: 64 mg/dL — ABNORMAL HIGH (ref 8–23)
CO2: 24 mmol/L (ref 22–32)
Calcium: 10.4 mg/dL — ABNORMAL HIGH (ref 8.9–10.3)
Chloride: 102 mmol/L (ref 98–111)
Creatinine, Ser: 1.7 mg/dL — ABNORMAL HIGH (ref 0.44–1.00)
GFR, Estimated: 31 mL/min — ABNORMAL LOW (ref >60)
Glucose, Bld: 125 mg/dL — ABNORMAL HIGH (ref 70–99)
Potassium: 4.7 mmol/L (ref 3.5–5.1)
Sodium: 139 mmol/L (ref 135–145)

## 2021-11-03 LAB — PROTIME-INR
INR: 1.1 (ref 0.8–1.2)
Prothrombin Time: 14.1 seconds (ref 11.4–15.2)

## 2021-11-03 LAB — APTT: aPTT: 28 seconds (ref 24–36)

## 2021-11-03 MED ORDER — AZATHIOPRINE 50 MG PO TABS
25.0000 mg | ORAL_TABLET | Freq: Every day | ORAL | Status: DC
  Administered 2021-11-05 – 2021-11-08 (×4): 25 mg via ORAL
  Filled 2021-11-03 (×6): qty 1

## 2021-11-03 MED ORDER — LACTATED RINGERS IV BOLUS
1000.0000 mL | Freq: Once | INTRAVENOUS | Status: AC
  Administered 2021-11-03: 1000 mL via INTRAVENOUS

## 2021-11-03 MED ORDER — PANTOPRAZOLE SODIUM 40 MG PO TBEC
40.0000 mg | DELAYED_RELEASE_TABLET | Freq: Every day | ORAL | Status: DC
Start: 2021-11-04 — End: 2021-11-08
  Administered 2021-11-05 – 2021-11-08 (×4): 40 mg via ORAL
  Filled 2021-11-03 (×4): qty 1

## 2021-11-03 MED ORDER — POLYETHYLENE GLYCOL 3350 17 G PO PACK: 17.0000 g | PACK | Freq: Every day | ORAL | Status: DC | PRN

## 2021-11-03 MED ORDER — PREDNISONE 5 MG PO TABS
7.5000 mg | ORAL_TABLET | Freq: Every day | ORAL | Status: DC
  Administered 2021-11-05 – 2021-11-08 (×4): 7.5 mg via ORAL
  Filled 2021-11-03 (×6): qty 1

## 2021-11-03 MED ORDER — SODIUM CHLORIDE 0.9 % IV SOLN: INTRAVENOUS | Status: DC

## 2021-11-03 MED ORDER — HYDROCODONE-ACETAMINOPHEN 5-325 MG PO TABS
1.0000 | ORAL_TABLET | Freq: Three times a day (TID) | ORAL | Status: DC | PRN
  Administered 2021-11-03 – 2021-11-07 (×4): 1 via ORAL
  Filled 2021-11-03 (×4): qty 1

## 2021-11-03 MED ORDER — HEPARIN (PORCINE) 25000 UT/250ML-% IV SOLN
850.0000 [IU]/h | INTRAVENOUS | Status: DC
Start: 2021-11-03 — End: 2021-11-05
  Administered 2021-11-03: 1000 [IU]/h via INTRAVENOUS
  Administered 2021-11-04 (×2): 850 [IU]/h via INTRAVENOUS
  Filled 2021-11-03 (×2): qty 250

## 2021-11-03 MED ORDER — ENSURE ENLIVE PO LIQD
237.0000 mL | Freq: Two times a day (BID) | ORAL | Status: DC
  Administered 2021-11-04 – 2021-11-08 (×2): 237 mL via ORAL

## 2021-11-03 MED ORDER — HEPARIN BOLUS VIA INFUSION
3500.0000 [IU] | Freq: Once | INTRAVENOUS | Status: AC
  Administered 2021-11-03: 3500 [IU] via INTRAVENOUS
  Filled 2021-11-03: qty 3500

## 2021-11-03 NOTE — ED Triage Notes (Signed)
Pt comes into the ED via [facility] from home c/o left calf pain.  PT recently discharged from [facility], but is now unable to walk due to the pain.  Pt states the pain is worse with movement.  Pt has some swelling in bilateral legs, but no redness noted.   ?

## 2021-11-03 NOTE — Progress Notes (Addendum)
[facility] ED 10 [facility])   ?    ?Referral received prior to hospice admission.[facility] will continue to follow for any discharge planning needs and to coordinate continuation of hospice care. Patient is not an active [facility] patient. ?   ?Please call with any questions/concerns.  ?  ?Thank you for the opportunity to participate in this patient's care. ? ?Odette Fraction, MSW ?[facility] Hospital Liaison  ?[phone] ? ?

## 2021-11-03 NOTE — ED Notes (Signed)
First Nurse Note:  Pt to ED via [facility] from home for 8/10 left calf pain. Per EMS pt is not able to stand. Pt was just discharged from [facility] for rehab for right femur fracture. Pt is currently in NAD.    ? ?

## 2021-11-03 NOTE — Progress Notes (Addendum)
1810 ?Pt admitted to floor from ED. Admitted from home for L calf pain. Pt diagnosis bilateral DVT. On heparin drip infusing at 24ml/hr. Possible thrombectomy tomorrow by vascular depending on kidney function.  ?Skin assessed with Marena Chancy RN. X2 Rns noticed bruising to both arms, excoriation to groin and diaper removed, pressure ulcer to bottom cleaned mepilex applied. ?Heels elevated on pillows. Gown changed. Call bell and possessions within reach. Bed alarm on.  ?

## 2021-11-03 NOTE — ED Provider Notes (Signed)
? ?[facility] ?Provider Note ? ? ? Event Date/Time  ? First MD Initiated Contact with Patient 11/03/21 1409   ?  (approximate) ? ? ?History  ? ?Leg Pain ? ? ?HPI ? ?[name] [name] is a 76 y.o. female with a past medical history of CKD, HTN, autoimmune hepatitis, normocytic anemia and sacral wound as well as recent admission for revision of right hip after a periprosthetic right hip fracture discharged from SNF on the 16th only minimally ambulatory with use of a cane but per patient and husband primarily using a wheelchair to get around presents for assessment of worsening pain in her left leg that she noticed over the last 24 hours.  She notes she is quite weak in both legs.  No subsequent injuries after leaving the hospital when she was here in March.  She denies any headache, earache, sore throat, chest pain, cough, shortness of breath, abdominal pain, vomiting, diarrhea or urinary symptoms.  She states she is never had a blood clot before and is not on any blood thinners.  No other acute concerns at this time. ? ?  ? ? ?Physical Exam  ?Triage Vital Signs: ?ED Triage Vitals  ?Enc Vitals Group  ?   BP 11/03/21 1308 (!) 118/96  ?   Pulse Rate 11/03/21 1308 89  ?   Resp 11/03/21 1308 18  ?   Temp 11/03/21 1308 97.8 ?F (36.6 ?C)  ?   Temp Source 11/03/21 1308 Oral  ?   SpO2 11/03/21 1308 92 %  ?   Weight 11/03/21 1309 130 lb 1.1 oz (59 kg)  ?   Height 11/03/21 1309 5\' 4"  (1.626 m)  ?   Head Circumference --   ?   [facility] Flow --   ?   Pain Score 11/03/21 1309 8  ?   Pain Loc --   ?   Pain Edu? --   ?   Excl. in GC? --   ? ? ?Most recent vital signs: ?Vitals:  ? 11/03/21 1445 11/03/21 1600  ?BP:  125/72  ?Pulse:  80  ?Resp:  19  ?Temp:    ?SpO2: 97% 95%  ? ? ?General: Awake, no distress.  ?CV:  Good peripheral perfusion.  2+ radial pulse.  1+ PT pulses bilaterally. ?Resp:  Normal effort.  Clear bilaterally ?Abd:  No distention.  Soft. ?Other:  Bilateral lower extremity edema.  Both legs are very  pale and she is fairly weak in both sides only able to wiggle her toes and hold her heel off the bed for less than 1 second.  There is no large focal joint effusions, streaking fluctuance or any bleeding. ? ? ?ED Results / Procedures / Treatments  ?Labs ?(all labs ordered are listed, but only abnormal results are displayed) ?Labs Reviewed  ?CBC WITH DIFFERENTIAL/PLATELET - Abnormal; Notable for the following components:  ?    Result Value  ? WBC 12.2 (*)   ? RDW 16.6 (*)   ? Neutro Abs 10.4 (*)   ? All other components within normal limits  ?BASIC METABOLIC PANEL - Abnormal; Notable for the following components:  ? Glucose, Bld 125 (*)   ? BUN 64 (*)   ? Creatinine, Ser 1.70 (*)   ? Calcium 10.4 (*)   ? GFR, Estimated 31 (*)   ? All other components within normal limits  ?APTT  ?PROTIME-INR  ?URINALYSIS, ROUTINE W REFLEX MICROSCOPIC  ?HEPARIN LEVEL (UNFRACTIONATED)  ? ? ? ?EKG ? ? ? ?  RADIOLOGY ? ?Left lower extremity ultrasound obtained earlier today reviewed myself as possible for DVT in the left femoral vein.  I reviewed radiology read and also agree with notation of positive right-sided DVT with extension into the common femoral vein with thrombus extending into the pelvis. ? ?PROCEDURES: ? ?Critical Care performed: No ? ?Procedures ? ? ? ?MEDICATIONS ORDERED IN ED: ?Medications  ?heparin ADULT infusion 100 units/mL (25000 units/228mL) (1,000 Units/hr Intravenous New Bag/Given 11/03/21 1602)  ?lactated ringers bolus 1,000 mL (0 mLs Intravenous Stopped 11/03/21 1559)  ?heparin bolus via infusion 3,500 Units (3,500 Units Intravenous Bolus from Bag 11/03/21 1602)  ? ? ? ?IMPRESSION / MDM / ASSESSMENT AND PLAN / ED COURSE  ?I reviewed the triage vital signs and the nursing notes. ?             ?               ? ?Differential diagnosis includes, but is not limited to provoked DVT in the setting of recent surgery as well as immobility without any history of internal injury and no findings on exam to suggest acute  infectious process.  Lower suspicion for acute arterial occlusion based on history and exam.  I reviewed ultrasound noted below. ? ?Left lower extremity ultrasound obtained earlier today reviewed myself as possible for DVT in the left femoral vein.  I reviewed radiology read and also agree with notation of positive right-sided DVT with extension into the common femoral vein with thrombus extending into the pelvis. ? ?Patient denies any shortness of breath cough or chest pain and has no hypoxia tachypnea or tachycardia and overall have low suspicion for PE at this time.  CBC obtained shows hemoglobin of 12.2 with platelets 331 and WBC count of 12.2.  BMP is remarkable for evidence of AKI with a BUN of 64 and a creatinine of 1.7 compared to 0.15-month ago.  No other acute electrolyte or metabolic derangements. ? ?Discussed patient's presentation and his work-up with on-call vascular surgeon Dr. Evie Lacks who came and evaluated patient at bedside.  She recommends starting heparin and admission to hospital service with plan for possible thrombectomy tomorrow.  Upper and ordered.  We will also start some IV fluids and concern patient may be dehydrated and her kidney injury may be prerenal. ? ?Of note there was a note placed in the chart for a month or care collective hospice.  Seen there was a referral made ?Asked patient about this she states no one ever told her she was near the end of her life or suspected die within 6 months and that she was only told that the hospice may be able to help with food in her home and getting home health. ? ?I will plan to admit to hospitalist service. ? ?FINAL CLINICAL IMPRESSION(S) / ED DIAGNOSES  ? ?Final diagnoses:  ?Acute deep vein thrombosis (DVT) of proximal vein of both lower extremities (HCC)  ?AKI (acute kidney injury) (HCC)  ? ? ? ?Rx / DC Orders  ? ?ED Discharge Orders   ? ? None  ? ?  ? ? ? ?Note:  This document was prepared using Dragon voice recognition software and may include  unintentional dictation errors. ?  ?Gilles Chiquito, MD ?11/03/21 1606 ? ?

## 2021-11-03 NOTE — Consult Note (Addendum)
?[facility] ?Vascular Consult Note ? ?MRN : [id number] ? ?[name] [name] is a 76 y.o. (12-12-1945) female who presents with chief complaint of  ?Chief Complaint  ?Patient presents with  ? Leg Pain  ?. ? ?History of Present Illness: Patient with history of HTN, autoimmune hepatitis, CKDIII, osteoporosis- recent RIGHT hip fracture and subsequent repair at [facility]. Presents with bilateral leg pain and swelling LEFT> Right. She is a poor historian but believes it began just today. Denies previous thrombotic phenomenon in the past. Denies prior leg swelling. States she was ambulatory prior to her hip fracture. Imaging upon ER assessment revealed bilateral DVTs- Right CFV to pelvis and Left distal femoral vein. ? ?Current Facility-Administered Medications  ?Medication Dose Route Frequency Provider Last Rate Last Admin  ? heparin ADULT infusion 100 units/mL (25000 units/210mL)  1,000 Units/hr Intravenous Continuous Eleonore Chiquito S, RPH      ? heparin bolus via infusion 3,500 Units  3,500 Units Intravenous Once Oswald Hillock, RPH      ? ?Current Outpatient Medications  ?Medication Sig Dispense Refill  ? acetaminophen (TYLENOL) 325 MG tablet Take 2 tablets (650 mg total) by mouth every 6 (six) hours as needed for mild pain or moderate pain (pain score 1-3 or temp > 100.5).    ? aspirin 81 MG chewable tablet Chew 1 tablet (81 mg total) by mouth 2 (two) times daily.    ? azaTHIOprine (IMURAN) 50 MG tablet Take 25 mg by mouth daily.  0  ? Calcium Carbonate Antacid 600 MG chewable tablet Chew 600 mg by mouth.    ? calcium citrate-vitamin D (CITRACAL+D) 315-200 MG-UNIT tablet Take 1 tablet by mouth 2 (two) times daily.    ? cyanocobalamin 100 MCG tablet Take 100 mcg by mouth daily.    ? estradiol (ESTRACE) 0.1 MG/GM vaginal cream Place 1 Applicatorful vaginally 3 (three) times a week.    ? feeding supplement (ENSURE ENLIVE / ENSURE PLUS) LIQD Take 237 mLs by mouth 2 (two) times daily between meals.  14220 mL 0  ? Ferrous Fumarate (HEMOCYTE - 106 MG FE) 324 (106 Fe) MG TABS tablet Take 1 tablet by mouth daily.    ? metoprolol tartrate (LOPRESSOR) 50 MG tablet Take 50 mg by mouth daily.    ? Misc. Devices (WALKER) MISC 1 Device by Does not apply route as directed. 1 each 0  ? Multiple Vitamin (MULTIVITAMIN WITH MINERALS) TABS tablet Take 1 tablet by mouth daily. 30 tablet 0  ? omeprazole (PRILOSEC) 40 MG capsule Take 40 mg by mouth daily.    ? polyethylene glycol (MIRALAX / GLYCOLAX) 17 g packet Take 17 g by mouth daily as needed for mild constipation. 30 each 0  ? potassium chloride (KLOR-CON) 10 MEQ tablet Take 10 mEq by mouth daily.    ? predniSONE (DELTASONE) 5 MG tablet Take 7.5 mg by mouth daily.  3  ? ? ?Past Medical History:  ?Diagnosis Date  ? Abnormal LFTs (liver function tests)   ? Autoimmune hepatitis ([place]) 2018  ? Autoimmune hepatitis ([place])   ? Autoimmune hepatitis treated with steroids ([place])   ? DOE (dyspnea on exertion)   ? GERD (gastroesophageal reflux disease)   ? Hepatitis   ? Hyperlipidemia   ? Hypertension   ? Iron deficiency anemia due to chronic blood loss 05/27/2021  ? Osteoporosis   ? ? ?Past Surgical History:  ?Procedure Laterality Date  ? ABDOMINAL HYSTERECTOMY    ? COLONOSCOPY  10/06/2015  ?  COLONOSCOPY WITH PROPOFOL N/A 08/27/2018  ? Procedure: COLONOSCOPY WITH PROPOFOL;  Surgeon: Manya Silvas, MD;  Location: [facility] ENDOSCOPY;  Service: Endoscopy;  Laterality: N/A;  ? ESOPHAGOGASTRODUODENOSCOPY (EGD) WITH PROPOFOL N/A 08/27/2018  ? Procedure: ESOPHAGOGASTRODUODENOSCOPY (EGD) WITH PROPOFOL;  Surgeon: Manya Silvas, MD;  Location: [facility] ENDOSCOPY;  Service: Endoscopy;  Laterality: N/A;  ? ESOPHAGOGASTRODUODENOSCOPY (EGD) WITH PROPOFOL N/A 05/21/2021  ? Procedure: ESOPHAGOGASTRODUODENOSCOPY (EGD) WITH PROPOFOL;  Surgeon: Lesly Rubenstein, MD;  Location: [facility] ENDOSCOPY;  Service: Endoscopy;  Laterality: N/A;  ? HIP ARTHROPLASTY Right 08/20/2021  ? Procedure: ARTHROPLASTY BIPOLAR HIP  (HEMIARTHROPLASTY);  Surgeon: Thornton Park, MD;  Location: [facility] ORS;  Service: Orthopedics;  Laterality: Right;  ? LIVER BIOPSY    ? TOTAL HIP REVISION Right 09/18/2021  ? Procedure: FEMORAL COMPONENT REVISION CONVERSION TO TOTAL HIP ARTHROPLASTY;  Surgeon: Rod Can, MD;  Location: [facility] ORS;  Service: Orthopedics;  Laterality: Right;  ? ? ?Social History ?Social History  ? ?Tobacco Use  ? Smoking status: Never  ? Smokeless tobacco: Never  ?Vaping Use  ? Vaping Use: Never used  ?Substance Use Topics  ? Alcohol use: No  ? Drug use: Never  ? ? ?Family History ?Family History  ?Problem Relation Age of Onset  ? Cancer Mother   ? ? ?Allergies  ?Allergen Reactions  ? Sulfa Antibiotics Nausea And Vomiting  ?  Pt has not had a sulfa drug in years and is not certain of reaction but suspects nausea and vomiting.  ? Sulfasalazine Nausea And Vomiting  ? ? ? ?REVIEW OF SYSTEMS (Negative unless checked) ? ?Constitutional: [] Weight loss  [] Fever  [] Chills ?Cardiac: [] Chest pain   [] Chest pressure   [] Palpitations   [] Shortness of breath when laying flat   [] Shortness of breath at rest   [] Shortness of breath with exertion. ?Vascular:  [] Pain in legs with walking   [x] Pain in legs at rest   [] Pain in legs when laying flat   [] Claudication   [] Pain in feet when walking  [] Pain in feet at rest  [] Pain in feet when laying flat   [] History of DVT   [] Phlebitis   [] Swelling in legs   [] Varicose veins   [] Non-healing ulcers ?Pulmonary:   [] Uses home oxygen   [] Productive cough   [] Hemoptysis   [] Wheeze  [] COPD   [] Asthma ?Neurologic:  [] Dizziness  [] Blackouts   [] Seizures   [] History of stroke   [] History of TIA  [] Aphasia   [] Temporary blindness   [] Dysphagia   [] Weakness or numbness in arms   [] Weakness or numbness in legs ?Musculoskeletal:  [] Arthritis   [] Joint swelling   [x] Joint pain   [] Low back pain ?Hematologic:  [] Easy bruising  [] Easy bleeding   [] Hypercoagulable state   [] Anemic  [] Hepatitis ?Gastrointestinal:  [] Blood  in stool   [] Vomiting blood  [] Gastroesophageal reflux/heartburn   [] Difficulty swallowing. ?Genitourinary:  [] Chronic kidney disease   [] Difficult urination  [] Frequent urination  [] Burning with urination   [] Blood in urine ?Skin:  [] Rashes   [] Ulcers   [x] Wounds ?Psychological:  [] History of anxiety   []  History of major depression. ? ?Physical Examination ? ?Vitals:  ? 11/03/21 1308 11/03/21 1309 11/03/21 1430 11/03/21 1445  ?BP: (!) 118/96  124/76   ?Pulse: 89  89   ?Resp: 18  18   ?Temp: 97.8 ?F (36.6 ?C)     ?TempSrc: Oral     ?SpO2: 92%  97% 97%  ?Weight:  59 kg    ?Height:  5\' 4"  (1.626 m)    ? ?  Body mass index is 22.33 kg/m?. ?Gen:  WD/WN, NAD ?Head: [place]/AT, No temporalis wasting. Prominent temp pulse not noted. ?Pulmonary:  Good air movement, respirations not labored, equal bilaterally.  ?Cardiac: RRR, normal S1, S2. ?Vascular:  ?Vessel Right Left  ?Radial Palpable Palpable  ?Ulnar Palpable Palpable  ?Brachial Palpable Palpable  ?Carotid Palpable, without bruit Palpable, without bruit  ?Aorta Not palpable N/A  ?Femoral Palpable Palpable  ?Popliteal    ?PT    ?DP    ? ?Gastrointestinal: soft, non-tender/non-distended. No guarding/reflex.  ?Musculoskeletal: M/S 5/5 throughout.  Extremities without ischemic changes. Pitting edema bilateral lower extremities and feet, warm to toes, +motor/+sensory ?Neurologic: Sensation grossly intact in extremities.  Symmetrical.  Speech is fluent. Motor exam as listed above. ? ? ? ? ? ?CBC ?Lab Results  ?Component Value Date  ? WBC 12.2 (H) 11/03/2021  ? HGB 13.3 11/03/2021  ? HCT 43.6 11/03/2021  ? MCV 88.3 11/03/2021  ? PLT 331 11/03/2021  ? ? ?BMET ?   ?Component Value Date/Time  ? NA 139 11/03/2021 1410  ? K 4.7 11/03/2021 1410  ? CL 102 11/03/2021 1410  ? CO2 24 11/03/2021 1410  ? GLUCOSE 125 (H) 11/03/2021 1410  ? BUN 64 (H) 11/03/2021 1410  ? CREATININE 1.70 (H) 11/03/2021 1410  ? CALCIUM 10.4 (H) 11/03/2021 1410  ? GFRNONAA 31 (L) 11/03/2021 1410  ? GFRAA 56 (L)  05/02/2018 1017  ? ?Estimated Creatinine Clearance: 24.3 mL/min (A) (by C-G formula based on SCr of 1.7 mg/dL (H)). ? ?COAG ?Lab Results  ?Component Value Date  ? INR 1.0 08/19/2021  ? INR 1.03 05/02/2018

## 2021-11-03 NOTE — H&P (Signed)
? ? ?History and Physical:  ? ? ?[name] [name]  ? ?ZOX:[id number] DOB: 06/30/1946 DOA: 11/03/2021 ? ?Referring MD/provider: Antoine Primas ?PCP: [facility]  ? ?Patient coming from: Home ? ?Chief Complaint: "I could not get up since yesterday" ? ?History of Present Illness:  ? ?[name] [name] is an 76 y.o. female with autoimmune hepatitis, HTN, CKD who is s/p recent hip fracture who represented after SNF with a recurrent fall.  Patient states she has been at home has been walking with a cane but mostly has been sedentary.  Yesterday patient states she was unable to get up out of her couch due to weakness and her legs felt heavy.  This morning when she still could not get up, she called the rescue squad and was brought to the ED.  Patient admits that she has not had anything to eat or drink over the past 24 to 36 hours.  Notes that she was stuck on her couch and her husband is blind although "he does try to help as much as he can". ? ?Patient denies any fevers or chills.  No new vomiting or diarrhea.  No bleeding as far she knows.  No chest pain.  No shortness of breath.  Patient notes she does not move around very much but does not think she has dyspnea on exertion. ? ?ED Course:  The patient was noted to be afebrile and normotensive.  She was noted to have AKI.  She had marked bilateral lower extremity swelling.  Duplex Doppler showed bilateral DVT with extension up into the pelvis.  Patient was seen by vascular surgery who are planning on a thrombectomy if patient's acute kidney injury is improved by tomorrow. ? ?ROS:  ? ?ROS  ? ?Review of Systems: ?Per HPI ? ?Past Medical History:  ? ?Past Medical History:  ?Diagnosis Date  ? Abnormal LFTs (liver function tests)   ? Autoimmune hepatitis ([facility]) 2018  ? Autoimmune hepatitis ([facility])   ? Autoimmune hepatitis treated with steroids ([facility])   ? DOE (dyspnea on exertion)   ? GERD (gastroesophageal reflux disease)   ? Hepatitis   ? Hyperlipidemia   ? Hypertension   ?  Iron deficiency anemia due to chronic blood loss 05/27/2021  ? Osteoporosis   ? ? ?Past Surgical History:  ? ?Past Surgical History:  ?Procedure Laterality Date  ? ABDOMINAL HYSTERECTOMY    ? COLONOSCOPY  10/06/2015  ? COLONOSCOPY WITH PROPOFOL N/A 08/27/2018  ? Procedure: COLONOSCOPY WITH PROPOFOL;  Surgeon: Scot Jun, MD;  [facility] ENDOSCOPY;  Service: Endoscopy;  Laterality: N/A;  ? ESOPHAGOGASTRODUODENOSCOPY (EGD) WITH PROPOFOL N/A 08/27/2018  ? Procedure: ESOPHAGOGASTRODUODENOSCOPY (EGD) WITH PROPOFOL;  Surgeon: Scot Jun, MD;  Location: [facility] ENDOSCOPY;  Service: Endoscopy;  Laterality: N/A;  ? ESOPHAGOGASTRODUODENOSCOPY (EGD) WITH PROPOFOL N/A 05/21/2021  ? Procedure: ESOPHAGOGASTRODUODENOSCOPY (EGD) WITH PROPOFOL;  Surgeon: Regis Bill, MD;  Location: [facility] ENDOSCOPY;  Service: Endoscopy;  Laterality: N/A;  ? HIP ARTHROPLASTY Right 08/20/2021  ? Procedure: ARTHROPLASTY BIPOLAR HIP (HEMIARTHROPLASTY);  Surgeon: Juanell Fairly, MD;  Location: [facility] ORS;  Service: Orthopedics;  Laterality: Right;  ? LIVER BIOPSY    ? TOTAL HIP REVISION Right 09/18/2021  ? Procedure: FEMORAL COMPONENT REVISION CONVERSION TO TOTAL HIP ARTHROPLASTY;  Surgeon: Samson Frederic, MD;  Location: [facility] ORS;  Service: Orthopedics;  Laterality: Right;  ? ? ?Social History:  ? ?Social History  ? ?Socioeconomic History  ? Marital status: Married  ?  Spouse name: Not on file  ?  Number of children: Not on file  ? Years of education: Not on file  ? Highest education level: Not on file  ?Occupational History  ? Not on file  ?Tobacco Use  ? Smoking status: Never  ? Smokeless tobacco: Never  ?Vaping Use  ? Vaping Use: Never used  ?Substance and Sexual Activity  ? Alcohol use: No  ? Drug use: Never  ? Sexual activity: Not Currently  ?  Birth control/protection: Post-menopausal  ?Other Topics Concern  ? Not on file  ?Social History Narrative  ? Not on file  ? ?Social Determinants of Health  ? ?Financial Resource Strain: Not on  file  ?Food Insecurity: Not on file  ?Transportation Needs: Not on file  ?Physical Activity: Not on file  ?Stress: Not on file  ?Social Connections: Not on file  ?Intimate Partner Violence: Not on file  ? ? ?Allergies  ? ?Sulfa antibiotics and Sulfasalazine ? ?Family history:  ? ?Family History  ?Problem Relation Age of Onset  ? Cancer Mother   ? ? ?Current Medications:  ? ?Prior to Admission medications   ?Medication Sig Start Date End Date Taking? Authorizing Provider  ?acetaminophen (TYLENOL) 325 MG tablet Take 2 tablets (650 mg total) by mouth every 6 (six) hours as needed for mild pain or moderate pain (pain score 1-3 or temp > 100.5). 08/24/21   Alford Highland, MD  ?aspirin 81 MG chewable tablet Chew 1 tablet (81 mg total) by mouth 2 (two) times daily. 09/24/21   Danford, Earl Lites, MD  ?azaTHIOprine (IMURAN) 50 MG tablet Take 25 mg by mouth daily. 09/22/17   [provider]  ?Calcium Carbonate Antacid 600 MG chewable tablet Chew 600 mg by mouth.    [provider]  ?calcium citrate-vitamin D (CITRACAL+D) 315-200 MG-UNIT tablet Take 1 tablet by mouth 2 (two) times daily.    [provider]  ?cyanocobalamin 100 MCG tablet Take 100 mcg by mouth daily.    [provider]  ?estradiol (ESTRACE) 0.1 MG/GM vaginal cream Place 1 Applicatorful vaginally 3 (three) times a week. 02/20/19   [provider]  ?feeding supplement (ENSURE ENLIVE / ENSURE PLUS) LIQD Take 237 mLs by mouth 2 (two) times daily between meals. 08/24/21   Alford Highland, MD  ?Ferrous Fumarate (HEMOCYTE - 106 MG FE) 324 (106 Fe) MG TABS tablet Take 1 tablet by mouth daily.    [provider]  ?metoprolol tartrate (LOPRESSOR) 50 MG tablet Take 50 mg by mouth daily.    [provider]  ?Misc. Devices (WALKER) MISC 1 Device by Does not apply route as directed. 10/05/17   Dionne Bucy, MD  ?Multiple Vitamin (MULTIVITAMIN WITH MINERALS) TABS tablet Take 1 tablet by mouth daily. 08/24/21    Alford Highland, MD  ?omeprazole (PRILOSEC) 40 MG capsule Take 40 mg by mouth daily. 08/11/20   [provider]  ?polyethylene glycol (MIRALAX / GLYCOLAX) 17 g packet Take 17 g by mouth daily as needed for mild constipation. 08/24/21   Alford Highland, MD  ?potassium chloride (KLOR-CON) 10 MEQ tablet Take 10 mEq by mouth daily. 10/03/19 09/18/21  [provider]  ?predniSONE (DELTASONE) 5 MG tablet Take 7.5 mg by mouth daily. 09/17/17   [provider]  ? ? ?Physical Exam:  ? ?Vitals:  ? 11/03/21 1309 11/03/21 1430 11/03/21 1445 11/03/21 1600  ?BP:  124/76  125/72  ?Pulse:  89  80  ?Resp:  18  19  ?Temp:      ?TempSrc:      ?  SpO2:  97% 97% 95%  ?Weight: 59 kg     ?Height: 5\' 4"  (1.626 m)     ? ? ? ?Physical Exam: ?Blood pressure 125/72, pulse 80, temperature 97.8 ?F (36.6 ?C), temperature source Oral, resp. rate 19, height 5\' 4"  (1.626 m), weight 59 kg, SpO2 95 %. ?Gen: Frail elderly patient sitting up in bed in no acute distress. ?Eyes: sclera anicteric, conjuctiva mildly injected bilaterally ?CVS: S1-S2, regulary, no gallops ?Respiratory:  decreased air entry likely secondary to decreased inspiratory effort ?GI: NABS, soft, NT  ?LE: Patient has bilateral lower extremity edema with what appears to be very poor muscle mass. ?Neuro: A/O x 3, grossly nonfocal.  ?Psych: mood and affect appropriate to situation. ? ?Data Review:  ? ? ?Labs: ?Basic Metabolic Panel: ?Recent Labs  ?Lab 11/03/21 ?1410  ?NA 139  ?K 4.7  ?CL 102  ?CO2 24  ?GLUCOSE 125*  ?BUN 64*  ?CREATININE 1.70*  ?CALCIUM 10.4*  ? ?Liver Function Tests: ?No results for input(s): AST, ALT, ALKPHOS, BILITOT, PROT, ALBUMIN in the last 168 hours. ?No results for input(s): LIPASE, AMYLASE in the last 168 hours. ?No results for input(s): AMMONIA in the last 168 hours. ?CBC: ?Recent Labs  ?Lab 11/03/21 ?1410  ?WBC 12.2*  ?NEUTROABS 10.4*  ?HGB 13.3  ?HCT 43.6  ?MCV 88.3  ?PLT 331  ? ?Cardiac Enzymes: ?No results for input(s): CKTOTAL, CKMB,  CKMBINDEX, TROPONINI in the last 168 hours. ? ?BNP (last 3 results) ?No results for input(s): PROBNP in the last 8760 hours. ?CBG: ?No results for input(s): GLUCAP in the last 168 hours. ? ?Urinalysis ?   ?

## 2021-11-03 NOTE — Consult Note (Signed)
ANTICOAGULATION CONSULT NOTE  ? ?Pharmacy Consult for Heparin  ?Indication:  VTE Treatment ? ?Allergies  ?Allergen Reactions  ? Sulfa Antibiotics Nausea And Vomiting  ?  Pt has not had a sulfa drug in years and is not certain of reaction but suspects nausea and vomiting.  ? Sulfasalazine Nausea And Vomiting  ? ? ?Patient Measurements: ?Height: 5\' 4"  (162.6 cm) ?Weight: 59 kg (130 lb 1.1 oz) ?IBW/kg (Calculated) : 54.7 ?Heparin Dosing Weight: 59 kg ? ?Vital Signs: ?Temp: 97.8 ?F (36.6 ?C) (04/19 1308) ?Temp Source: Oral (04/19 1308) ?BP: 124/76 (04/19 1430) ?Pulse Rate: 89 (04/19 1430) ? ?Labs: ?Recent Labs  ?  11/03/21 ?1410  ?HGB 13.3  ?HCT 43.6  ?PLT 331  ?CREATININE 1.70*  ? ? ?Estimated Creatinine Clearance: 24.3 mL/min (A) (by C-G formula based on SCr of 1.7 mg/dL (H)). ? ? ?Medical History: ?Past Medical History:  ?Diagnosis Date  ? Abnormal LFTs (liver function tests)   ? Autoimmune hepatitis ([facility]) 2018  ? Autoimmune hepatitis ([facility])   ? Autoimmune hepatitis treated with steroids ([facility])   ? DOE (dyspnea on exertion)   ? GERD (gastroesophageal reflux disease)   ? Hepatitis   ? Hyperlipidemia   ? Hypertension   ? Iron deficiency anemia due to chronic blood loss 05/27/2021  ? Osteoporosis   ? ? ?Medications:  ?(Not in a hospital admission)  ?Scheduled:  ?Infusions:  ?PRN:  ?Anti-infectives (From admission, onward)  ? ? None  ? ?  ? ? ?Assessment: ?Pharmacy consulted for heparin for VTE treatment. Directed duplex of the left lower extremity positive for DVT of the distal left femoral vein. No DOAC PTA noted.  ? ?Goal of Therapy:  ?Heparin level 0.3-0.7 units/ml ?Monitor platelets by anticoagulation protocol: Yes ?  ?Plan:  ?Give 3500 units bolus x 1 ?Start heparin infusion at 1000 units/hr ?Check anti-Xa level in 8 hours and daily while on heparin ?Continue to monitor H&H and platelets ? ?13/04/2021, PharmD, BCPS ?11/03/2021,3:09 PM ? ? ?

## 2021-11-04 ENCOUNTER — Encounter
Admission: EM | Disposition: A | Discharge status: Skilled Nursing Facility | Payer: Self-pay | Source: Home / Self Care | Attending: Internal Medicine

## 2021-11-04 ENCOUNTER — Encounter: Payer: Self-pay | Admitting: Vascular Surgery

## 2021-11-04 ENCOUNTER — Encounter: Payer: Self-pay | Admitting: Registered Nurse

## 2021-11-04 DIAGNOSIS — I82512 Chronic embolism and thrombosis of left femoral vein: Secondary | ICD-10-CM

## 2021-11-04 DIAGNOSIS — I82431 Acute embolism and thrombosis of right popliteal vein: Secondary | ICD-10-CM

## 2021-11-04 DIAGNOSIS — I82413 Acute embolism and thrombosis of femoral vein, bilateral: Secondary | ICD-10-CM | POA: Diagnosis not present

## 2021-11-04 DIAGNOSIS — I82532 Chronic embolism and thrombosis of left popliteal vein: Secondary | ICD-10-CM

## 2021-11-04 DIAGNOSIS — I871 Compression of vein: Secondary | ICD-10-CM

## 2021-11-04 DIAGNOSIS — I82411 Acute embolism and thrombosis of right femoral vein: Principal | ICD-10-CM

## 2021-11-04 HISTORY — PX: PERIPHERAL VASCULAR THROMBECTOMY: CATH118306

## 2021-11-04 LAB — CBC
HCT: 35.4 % — ABNORMAL LOW (ref 36.0–46.0)
Hemoglobin: 10.6 g/dL — ABNORMAL LOW (ref 12.0–15.0)
MCH: 26.9 pg (ref 26.0–34.0)
MCHC: 29.9 g/dL — ABNORMAL LOW (ref 30.0–36.0)
MCV: 89.8 fL (ref 80.0–100.0)
Platelets: 188 10*3/uL (ref 150–400)
RBC: 3.94 MIL/uL (ref 3.87–5.11)
RDW: 16.5 % — ABNORMAL HIGH (ref 11.5–15.5)
WBC: 8.6 10*3/uL (ref 4.0–10.5)
nRBC: 0 % (ref 0.0–0.2)

## 2021-11-04 LAB — HEPARIN LEVEL (UNFRACTIONATED)
Heparin Unfractionated: 0.38 IU/mL (ref 0.30–0.70)
Heparin Unfractionated: 0.61 IU/mL (ref 0.30–0.70)
Heparin Unfractionated: 0.84 IU/mL — ABNORMAL HIGH (ref 0.30–0.70)

## 2021-11-04 LAB — BASIC METABOLIC PANEL
Anion gap: 7 (ref 5–15)
BUN: 55 mg/dL — ABNORMAL HIGH (ref 8–23)
CO2: 24 mmol/L (ref 22–32)
Calcium: 9.1 mg/dL (ref 8.9–10.3)
Chloride: 110 mmol/L (ref 98–111)
Creatinine, Ser: 1.23 mg/dL — ABNORMAL HIGH (ref 0.44–1.00)
GFR, Estimated: 46 mL/min — ABNORMAL LOW (ref >60)
Glucose, Bld: 85 mg/dL (ref 70–99)
Potassium: 3.7 mmol/L (ref 3.5–5.1)
Sodium: 141 mmol/L (ref 135–145)

## 2021-11-04 SURGERY — PERIPHERAL VASCULAR THROMBECTOMY
Anesthesia: Moderate Sedation | Laterality: Bilateral

## 2021-11-04 MED ORDER — IODIXANOL 320 MG/ML IV SOLN
INTRAVENOUS | Status: DC | PRN
  Administered 2021-11-04: 30 mL via INTRAVENOUS

## 2021-11-04 MED ORDER — ALTEPLASE 2 MG IJ SOLR
INTRAMUSCULAR | Status: AC
  Filled 2021-11-04: qty 4

## 2021-11-04 MED ORDER — HYDRALAZINE HCL 20 MG/ML IJ SOLN
10.0000 mg | Freq: Four times a day (QID) | INTRAMUSCULAR | Status: DC | PRN
Start: 2021-11-04 — End: 2021-11-08
  Administered 2021-11-07: 10 mg via INTRAVENOUS
  Filled 2021-11-04: qty 1

## 2021-11-04 MED ORDER — MIDAZOLAM HCL 2 MG/2ML IJ SOLN
INTRAMUSCULAR | Status: DC | PRN
Start: 2021-11-04 — End: 2021-11-04
  Administered 2021-11-04: 2 mg via INTRAVENOUS

## 2021-11-04 MED ORDER — CEFAZOLIN SODIUM-DEXTROSE 2-4 GM/100ML-% IV SOLN: 2.0000 g | Freq: Once | INTRAVENOUS | Status: AC

## 2021-11-04 MED ORDER — HYDRALAZINE HCL 20 MG/ML IJ SOLN: 10.0000 mg | Freq: Four times a day (QID) | INTRAMUSCULAR | Status: DC | PRN

## 2021-11-04 MED ORDER — ALTEPLASE 1 MG/ML SYRINGE FOR VASCULAR PROCEDURE
INTRAMUSCULAR | Status: DC | PRN
  Administered 2021-11-04 (×2): 2 mg via INTRA_ARTERIAL

## 2021-11-04 MED ORDER — FENTANYL CITRATE PF 50 MCG/ML IJ SOSY
PREFILLED_SYRINGE | INTRAMUSCULAR | Status: AC
  Filled 2021-11-04: qty 2

## 2021-11-04 MED ORDER — CEFAZOLIN SODIUM-DEXTROSE 2-4 GM/100ML-% IV SOLN
INTRAVENOUS | Status: AC
  Administered 2021-11-04: 2 g via INTRAVENOUS
  Filled 2021-11-04: qty 100

## 2021-11-04 MED ORDER — MIDAZOLAM HCL 2 MG/2ML IJ SOLN
INTRAMUSCULAR | Status: AC
  Filled 2021-11-04: qty 4

## 2021-11-04 MED ORDER — HEPARIN SODIUM (PORCINE) 1000 UNIT/ML IJ SOLN
INTRAMUSCULAR | Status: AC
  Filled 2021-11-04: qty 10

## 2021-11-04 MED ORDER — FENTANYL CITRATE (PF) 100 MCG/2ML IJ SOLN
INTRAMUSCULAR | Status: DC | PRN
  Administered 2021-11-04: 50 ug via INTRAVENOUS

## 2021-11-04 SURGICAL SUPPLY — 13 items
CANISTER PENUMBRA ENGINE (MISCELLANEOUS) ×1 IMPLANT
CANNULA 5F STIFF (CANNULA) ×1 IMPLANT
CATH INDIGO 12XTORQ 100 (CATHETERS) ×1 IMPLANT
CATH KUMPE SOFT-VU 5FR 65 (CATHETERS) ×1 IMPLANT
COVER DRAPE FLUORO 36X44 (DRAPES) ×2 IMPLANT
COVER PROBE U/S 5X48 (MISCELLANEOUS) ×1 IMPLANT
GLIDEWIRE ADV .035X180CM (WIRE) ×1 IMPLANT
GLIDEWIRE ADV .035X260CM (WIRE) ×1 IMPLANT
GOWN SRG XL LVL 3 NONREINFORCE (GOWNS) IMPLANT
GOWN STRL NON-REIN TWL XL LVL3 (GOWNS) ×2
PACK ANGIOGRAPHY (CUSTOM PROCEDURE TRAY) ×2 IMPLANT
SHEATH PINNACLE 11FRX10 (SHEATH) ×2 IMPLANT
WIRE GUIDERIGHT .035X150 (WIRE) ×1 IMPLANT

## 2021-11-04 NOTE — OR Nursing (Signed)
Pt arrived from inpt room. She is alert and oriented x 4 she was able to say why she was in our department. Consent was obtained by inpt unit nurse. Heparin infusion stopped fro procedure. Ns infusion started for procedure in right forearm IV site.  ?

## 2021-11-04 NOTE — Consult Note (Signed)
ANTICOAGULATION CONSULT NOTE  ? ?Pharmacy Consult for Heparin  ?Indication:  VTE Treatment ? ?Allergies  ?Allergen Reactions  ? Sulfa Antibiotics Nausea And Vomiting  ?  Pt has not had a sulfa drug in years and is not certain of reaction but suspects nausea and vomiting.  ? Sulfasalazine Nausea And Vomiting  ? ? ?Patient Measurements: ?Height: 5\' 4"  (162.6 cm) ?Weight: 59 kg (130 lb 1.1 oz) ?IBW/kg (Calculated) : 54.7 ?Heparin Dosing Weight: 59 kg ? ?Vital Signs: ?Temp: 98.3 ?F (36.8 ?C) (04/19 1958) ?Temp Source: Oral (04/19 1807) ?BP: 131/82 (04/19 1958) ?Pulse Rate: 98 (04/19 1958) ? ?Labs: ?Recent Labs  ?  11/03/21 ?1410 11/03/21 ?1514 11/03/21 ?2349  ?HGB 13.3  --   --   ?HCT 43.6  --   --   ?PLT 331  --   --   ?APTT  --  28  --   ?LABPROT  --  14.1  --   ?INR  --  1.1  --   ?HEPARINUNFRC  --   --  0.61  ?CREATININE 1.70*  --   --   ? ? ? ?Estimated Creatinine Clearance: 24.3 mL/min (A) (by C-G formula based on SCr of 1.7 mg/dL (H)). ? ? ?Medical History: ?Past Medical History:  ?Diagnosis Date  ? Abnormal LFTs (liver function tests)   ? Autoimmune hepatitis ([place]) 2018  ? Autoimmune hepatitis ([place])   ? Autoimmune hepatitis treated with steroids ([place])   ? DOE (dyspnea on exertion)   ? GERD (gastroesophageal reflux disease)   ? Hepatitis   ? Hyperlipidemia   ? Hypertension   ? Iron deficiency anemia due to chronic blood loss 05/27/2021  ? Osteoporosis   ? ? ?Medications:  ?Medications Prior to Admission  ?Medication Sig Dispense Refill Last Dose  ? diphenhydramine-acetaminophen (TYLENOL PM) 25-500 MG TABS tablet Take 1 tablet by mouth at bedtime as needed.   11/02/2021 at NIGHT  ? acetaminophen (TYLENOL) 325 MG tablet Take 2 tablets (650 mg total) by mouth every 6 (six) hours as needed for mild pain or moderate pain (pain score 1-3 or temp > 100.5).   PRN at PRN  ? amLODipine (NORVASC) 5 MG tablet Take 5 mg by mouth daily.   10/31/2021  ? aspirin 81 MG chewable tablet Chew 1 tablet (81 mg total) by mouth 2 (two)  times daily.   10/31/2021  ? azaTHIOprine (IMURAN) 50 MG tablet Take 25 mg by mouth daily.  0 10/31/2021  ? Calcium Carbonate Antacid 600 MG chewable tablet Chew 600 mg by mouth.   10/31/2021  ? calcium citrate-vitamin D (CITRACAL+D) 315-200 MG-UNIT tablet Take 1 tablet by mouth 2 (two) times daily.   10/31/2021  ? cyanocobalamin 100 MCG tablet Take 100 mcg by mouth daily.   10/31/2021  ? estradiol (ESTRACE) 0.1 MG/GM vaginal cream Place 1 Applicatorful vaginally 3 (three) times a week.   PRN at PRN  ? feeding supplement (ENSURE ENLIVE / ENSURE PLUS) LIQD Take 237 mLs by mouth 2 (two) times daily between meals. 14220 mL 0   ? Ferrous Fumarate (HEMOCYTE - 106 MG FE) 324 (106 Fe) MG TABS tablet Take 1 tablet by mouth daily.   10/31/2021  ? HYDROcodone-acetaminophen (NORCO/VICODIN) 5-325 MG tablet Take 1 tablet by mouth daily as needed.   10/31/2021  ? metoprolol tartrate (LOPRESSOR) 50 MG tablet Take 50 mg by mouth daily.   10/31/2021  ? Misc. Devices (WALKER) MISC 1 Device by Does not apply route as directed. 1 each 0   ?  Multiple Vitamin (MULTIVITAMIN WITH MINERALS) TABS tablet Take 1 tablet by mouth daily. 30 tablet 0 10/31/2021  ? omeprazole (PRILOSEC) 40 MG capsule Take 40 mg by mouth daily.   10/31/2021  ? polyethylene glycol (MIRALAX / GLYCOLAX) 17 g packet Take 17 g by mouth daily as needed for mild constipation. 30 each 0 PRN at PRN  ? potassium chloride (KLOR-CON M) 10 MEQ tablet Take 10 mEq by mouth daily.   10/31/2021  ? potassium chloride (KLOR-CON) 10 MEQ tablet Take 10 mEq by mouth daily.     ? predniSONE (DELTASONE) 5 MG tablet Take 7.5 mg by mouth daily.  3 10/31/2021  ? sulfamethoxazole-trimethoprim (BACTRIM DS) 800-160 MG tablet Take 1 tablet by mouth.   10/31/2021  ? ?Scheduled:  ?Infusions:  ?PRN:  ?Anti-infectives (From admission, onward)  ? ? None  ? ?  ? ? ?Assessment: ?Pharmacy consulted for heparin for VTE treatment. Directed duplex of the left lower extremity positive for DVT of the distal left femoral  vein. No DOAC PTA noted.  ? ?Goal of Therapy:  ?Heparin level 0.3-0.7 units/ml ?Monitor platelets by anticoagulation protocol: Yes ? ?4/19 2349 HL 0.6, therapeutic x 1 ?  ?Plan:  ?Continue heparin infusion at 1000 units/hr ?Recheck HL w/ AM labs to confirm ?CBC daily while on heparin ? ?Renda Rolls, PharmD, MBA ?11/04/2021 ?12:34 AM ? ? ? ?

## 2021-11-04 NOTE — OR Nursing (Signed)
Small skin tear posterior left arm. Covered with gauze and wrapped with gauze.  ?

## 2021-11-04 NOTE — Consult Note (Signed)
ANTICOAGULATION CONSULT NOTE  ? ?Pharmacy Consult for Heparin  ?Indication:  VTE Treatment ? ?Allergies  ?Allergen Reactions  ? Sulfa Antibiotics Nausea And Vomiting  ?  Pt has not had a sulfa drug in years and is not certain of reaction but suspects nausea and vomiting.  ? Sulfasalazine Nausea And Vomiting  ? ? ?Patient Measurements: ?Height: 5\' 4"  (162.6 cm) ?Weight: 59 kg (130 lb 1.1 oz) ?IBW/kg (Calculated) : 54.7 ?Heparin Dosing Weight: 59 kg ? ?Vital Signs: ?Temp: 98.3 ?F (36.8 ?C) (04/20 2033) ?BP: 139/72 (04/20 2033) ?Pulse Rate: 97 (04/20 2033) ? ?Labs: ?Recent Labs  ?  11/03/21 ?1410 11/03/21 ?1514 11/03/21 ?2349 11/04/21 ?11/06/21 11/04/21 ?2223  ?HGB 13.3  --   --  10.6*  --   ?HCT 43.6  --   --  35.4*  --   ?PLT 331  --   --  188  --   ?APTT  --  28  --   --   --   ?LABPROT  --  14.1  --   --   --   ?INR  --  1.1  --   --   --   ?HEPARINUNFRC  --   --  0.61 0.84* 0.38  ?CREATININE 1.70*  --   --  1.23*  --   ? ? ? ?Estimated Creatinine Clearance: 33.6 mL/min (A) (by C-G formula based on SCr of 1.23 mg/dL (H)). ? ? ?Medical History: ?Past Medical History:  ?Diagnosis Date  ? Abnormal LFTs (liver function tests)   ? Autoimmune hepatitis (HCC) 2018  ? Autoimmune hepatitis (HCC)   ? Autoimmune hepatitis treated with steroids (HCC)   ? DOE (dyspnea on exertion)   ? GERD (gastroesophageal reflux disease)   ? Hepatitis   ? Hyperlipidemia   ? Hypertension   ? Iron deficiency anemia due to chronic blood loss 05/27/2021  ? Osteoporosis   ? ? ?Medications:  ?Medications Prior to Admission  ?Medication Sig Dispense Refill Last Dose  ? diphenhydramine-acetaminophen (TYLENOL PM) 25-500 MG TABS tablet Take 1 tablet by mouth at bedtime as needed.   11/02/2021 at NIGHT  ? acetaminophen (TYLENOL) 325 MG tablet Take 2 tablets (650 mg total) by mouth every 6 (six) hours as needed for mild pain or moderate pain (pain score 1-3 or temp > 100.5).   PRN at PRN  ? amLODipine (NORVASC) 5 MG tablet Take 5 mg by mouth daily.    10/31/2021  ? aspirin 81 MG chewable tablet Chew 1 tablet (81 mg total) by mouth 2 (two) times daily.   10/31/2021  ? azaTHIOprine (IMURAN) 50 MG tablet Take 25 mg by mouth daily.  0 10/31/2021  ? Calcium Carbonate Antacid 600 MG chewable tablet Chew 600 mg by mouth.   10/31/2021  ? calcium citrate-vitamin D (CITRACAL+D) 315-200 MG-UNIT tablet Take 1 tablet by mouth 2 (two) times daily.   10/31/2021  ? cyanocobalamin 100 MCG tablet Take 100 mcg by mouth daily.   10/31/2021  ? estradiol (ESTRACE) 0.1 MG/GM vaginal cream Place 1 Applicatorful vaginally 3 (three) times a week.   PRN at PRN  ? feeding supplement (ENSURE ENLIVE / ENSURE PLUS) LIQD Take 237 mLs by mouth 2 (two) times daily between meals. 14220 mL 0   ? Ferrous Fumarate (HEMOCYTE - 106 MG FE) 324 (106 Fe) MG TABS tablet Take 1 tablet by mouth daily.   10/31/2021  ? HYDROcodone-acetaminophen (NORCO/VICODIN) 5-325 MG tablet Take 1 tablet by mouth daily as needed.   10/31/2021  ?  metoprolol tartrate (LOPRESSOR) 50 MG tablet Take 50 mg by mouth daily.   10/31/2021  ? Misc. Devices (WALKER) MISC 1 Device by Does not apply route as directed. 1 each 0   ? Multiple Vitamin (MULTIVITAMIN WITH MINERALS) TABS tablet Take 1 tablet by mouth daily. 30 tablet 0 10/31/2021  ? omeprazole (PRILOSEC) 40 MG capsule Take 40 mg by mouth daily.   10/31/2021  ? polyethylene glycol (MIRALAX / GLYCOLAX) 17 g packet Take 17 g by mouth daily as needed for mild constipation. 30 each 0 PRN at PRN  ? potassium chloride (KLOR-CON M) 10 MEQ tablet Take 10 mEq by mouth daily.   10/31/2021  ? potassium chloride (KLOR-CON) 10 MEQ tablet Take 10 mEq by mouth daily.     ? predniSONE (DELTASONE) 5 MG tablet Take 7.5 mg by mouth daily.  3 10/31/2021  ? sulfamethoxazole-trimethoprim (BACTRIM DS) 800-160 MG tablet Take 1 tablet by mouth.   10/31/2021  ? ?Scheduled:  ?Infusions:  ?PRN:  ?Anti-infectives (From admission, onward)  ? ? Start     Dose/Rate Route Frequency Ordered Stop  ? 11/04/21 1200  ceFAZolin  (ANCEF) IVPB 2g/100 mL premix       ? 2 g ?200 mL/hr over 30 Minutes Intravenous  Once 11/04/21 1151 11/04/21 1221  ? ?  ? ? ?Assessment: ?Pharmacy consulted for heparin for VTE treatment. Directed duplex of the left lower extremity positive for DVT of the distal left femoral vein. No DOAC PTA noted.  ? ?Goal of Therapy:  ?Heparin level 0.3-0.7 units/ml ?Monitor platelets by anticoagulation protocol: Yes ? ?4/19 2349 HL 0.6, therapeutic x 1 ?4/20 0547 HL 0.84, supratherapeutic ?4/20 2223 HL 0.38, therapeutic x 1 ?  ?Plan:  ?Continue heparin infusion at 850 units/hr ?Recheck HL w/ AM labs to confirm ?CBC daily while on heparin ? ?Otelia Sergeant, PharmD, MBA ?11/04/2021 ?11:12 PM ? ? ? ?

## 2021-11-04 NOTE — Op Note (Signed)
[facility] ? ? ?OPERATIVE NOTE ? ? ?PRE-OPERATIVE DIAGNOSIS: extensive right leg DVT and left leg DVT ? ?POST-OPERATIVE DIAGNOSIS: extensive right leg DVT, left leg with small atretic vein and chronic DVT ? ?PROCEDURE: ?1.   US guidance for vascular access to bilateral popliteal veins ?2.   Catheter placement into right external iliac vein from right popliteal approach ?3.   IVC gram and bilateral lower extremity venogram (needle venogram on the left) ?4.   Catheter directed thrombolysis with right popliteal and femoral vein to the 4 mg of tPA ?5.   Mechanical thrombectomy to the right popliteal, superficial femoral, and common femoral veins ? ? ? ? ?SURGEON: Festus Barren, MD ? ?ASSISTANT(S): Eli Hose, MD ? ?ANESTHESIA: local with moderate conscious sedation for 39 minutes using 2 mg of Versed and 50 mcg of Fentanyl ? ?ESTIMATED BLOOD LOSS: 200 cc ? ?FINDING(S): ?1.  Left popliteal and superficial femoral veins were atretic with large collaterals and no acute thrombus seen consistent with chronic DVT on the left not amenable to thrombectomy. ?2.  Extensive thrombus in the right popliteal, superficial femoral, and common femoral veins with no thrombus in the external iliac vein, common iliac vein, or inferior vena cava ? ?SPECIMEN(S):  none ? ?INDICATIONS:    ?Patient is a 76 y.o. female who presents with significant leg pain and swelling and an ultrasound read as bilateral lower extremity DVT.  Patient has marked leg swelling and pain.  Venous intervention is performed to reduce the symtpoms and avoid long term postphlebitic symptoms.   ? ?DESCRIPTION: ?After obtaining full informed written consent, the patient was brought back to the vascular suite and placed supine upon the table. Moderate conscious sedation was administered during a face to face encounter with the patient throughout the procedure with my supervision of the RN administering medicines and monitoring the patient's vital  signs, pulse oximetry, telemetry and mental status throughout from the start of the procedure until the patient was taken to the recovery room.  After obtaining adequate anesthesia, the patient was prepped and draped in the standard fashion.    ?The patient was then placed into the prone position.  The left popliteal vein was accessed under direct ultrasound guidance but was found to be extremely small and this was done with some difficulty.  A wire would not pass beyond the popliteal vein.  A needle venogram was performed in the left lower extremity showing the left popliteal and superficial femoral veins to be chronically occluded without acute thrombus.  There were large collaterals feeding to the upper leg joint in the common femoral vein.  This was clearly not going to be amenable to any thrombectomy today and the needle was removed. ?The right popliteal vein was then accessed under direct ultrasound guidance without difficulty with a micropuncture needle and a permanent image was recorded.  I then upsized to an 11Fr sheath over a J wire.  Imaging showed extensive DVT with minimal flow.  A Kumpe catheter and Magic tourque wire were then advanced into the CFV and images were performed.   I was able to cross the thrombus and stenosis and advance into the right external iliac vein which was patent.  Imaging also showed the common iliac vein and IVC to be widely patent.  I then used the sheath and instilled 4 mg of tpa throughout the popliteal and superficial femoral veins.  After this dwelled for several minutes, I used the Penumbra Cat 12 catheter and evacuated  about 100 cc of effluent with mechanical thrombectomy throughout CFV, SFV, and popliteal vein.  This had marked improvement.  There was some residual thrombus in the common femoral vein and the most proximal point of the superficial femoral vein and so the penumbra CAT 12 catheter was taken back up into these areas and the wire was removed and we performed  thrombectomy in this location without wire access.  Completion imaging showed only a small amount of residual thrombus in this area with essentially clearance of thrombus throughout the remainder of the superficial femoral vein and continued patency of the iliac veins.  I then elected to terminate the procedure.  The sheath was removed and suction was kept on the penumbra CAT 12 device through the sheath on removal to try to remove any residual thrombus in the popliteal vein.  Once these were completely out, pressure was held and a dressing was placed.  She was taken to the recovery room in stable condition having tolerated the procedure well.   ? ?COMPLICATIONS: None ? ?CONDITION: Stable ? ?Festus Barren ?11/04/2021 ?12:28 PM  ?

## 2021-11-04 NOTE — Plan of Care (Signed)

## 2021-11-04 NOTE — TOC Progression Note (Signed)
Transition of Care (TOC) - Progression Note  ? ? ?Patient Details  ?Name: [name] [name] ?MRN: [id number] ?Date of Birth: December 16, 1945 ? ?Transition of Care (TOC) CM/SW Contact  ?Conception Oms, RN ?Phone Number: ?11/04/2021, 10:54 AM ? ?Clinical Narrative:    ? ? ?Met with the patient at the bedside ?She lives at home with her husband ?She has a rolling walker and wheelchair at home ?She was recently discharged from [facility] ?Shew wants to return home and possibly get [facility] PT ?She is to have a procedure today  ?TOC will follow for needs ?  ?  ? ?Expected Discharge Plan and Services ?  ?  ?  ?  ?  ?                ?  ?  ?  ?  ?  ?  ?  ?  ?  ?  ? ? ?Social Determinants of Health (SDOH) Interventions ?  ? ?Readmission Risk Interventions ?   ? View : No data to display.  ?  ?  ?  ? ? ?

## 2021-11-04 NOTE — Consult Note (Addendum)
ANTICOAGULATION CONSULT NOTE  ? ?Pharmacy Consult for Heparin  ?Indication:  VTE Treatment ? ?Allergies  ?Allergen Reactions  ? Sulfa Antibiotics Nausea And Vomiting  ?  Pt has not had a sulfa drug in years and is not certain of reaction but suspects nausea and vomiting.  ? Sulfasalazine Nausea And Vomiting  ? ? ?Patient Measurements: ?Height: 5\' 4"  (162.6 cm) ?Weight: 59 kg (130 lb 1.1 oz) ?IBW/kg (Calculated) : 54.7 ?Heparin Dosing Weight: 59 kg ? ?Vital Signs: ?Temp: 98.3 ?F (36.8 ?C) (04/20 0300) ?Temp Source: Oral (04/20 0300) ?BP: 126/90 (04/20 0300) ?Pulse Rate: 94 (04/20 0300) ? ?Labs: ?Recent Labs  ?  11/03/21 ?1410 11/03/21 ?1514 11/03/21 ?2349 11/04/21 ?11/06/21  ?HGB 13.3  --   --  10.6*  ?HCT 43.6  --   --  35.4*  ?PLT 331  --   --  188  ?APTT  --  28  --   --   ?LABPROT  --  14.1  --   --   ?INR  --  1.1  --   --   ?HEPARINUNFRC  --   --  0.61 0.84*  ?CREATININE 1.70*  --   --   --   ? ? ? ?Estimated Creatinine Clearance: 24.3 mL/min (A) (by C-G formula based on SCr of 1.7 mg/dL (H)). ? ? ?Medical History: ?Past Medical History:  ?Diagnosis Date  ? Abnormal LFTs (liver function tests)   ? Autoimmune hepatitis (HCC) 2018  ? Autoimmune hepatitis (HCC)   ? Autoimmune hepatitis treated with steroids (HCC)   ? DOE (dyspnea on exertion)   ? GERD (gastroesophageal reflux disease)   ? Hepatitis   ? Hyperlipidemia   ? Hypertension   ? Iron deficiency anemia due to chronic blood loss 05/27/2021  ? Osteoporosis   ? ? ?Medications:  ?Medications Prior to Admission  ?Medication Sig Dispense Refill Last Dose  ? diphenhydramine-acetaminophen (TYLENOL PM) 25-500 MG TABS tablet Take 1 tablet by mouth at bedtime as needed.   11/02/2021 at NIGHT  ? acetaminophen (TYLENOL) 325 MG tablet Take 2 tablets (650 mg total) by mouth every 6 (six) hours as needed for mild pain or moderate pain (pain score 1-3 or temp > 100.5).   PRN at PRN  ? amLODipine (NORVASC) 5 MG tablet Take 5 mg by mouth daily.   10/31/2021  ? aspirin 81 MG  chewable tablet Chew 1 tablet (81 mg total) by mouth 2 (two) times daily.   10/31/2021  ? azaTHIOprine (IMURAN) 50 MG tablet Take 25 mg by mouth daily.  0 10/31/2021  ? Calcium Carbonate Antacid 600 MG chewable tablet Chew 600 mg by mouth.   10/31/2021  ? calcium citrate-vitamin D (CITRACAL+D) 315-200 MG-UNIT tablet Take 1 tablet by mouth 2 (two) times daily.   10/31/2021  ? cyanocobalamin 100 MCG tablet Take 100 mcg by mouth daily.   10/31/2021  ? estradiol (ESTRACE) 0.1 MG/GM vaginal cream Place 1 Applicatorful vaginally 3 (three) times a week.   PRN at PRN  ? feeding supplement (ENSURE ENLIVE / ENSURE PLUS) LIQD Take 237 mLs by mouth 2 (two) times daily between meals. 14220 mL 0   ? Ferrous Fumarate (HEMOCYTE - 106 MG FE) 324 (106 Fe) MG TABS tablet Take 1 tablet by mouth daily.   10/31/2021  ? HYDROcodone-acetaminophen (NORCO/VICODIN) 5-325 MG tablet Take 1 tablet by mouth daily as needed.   10/31/2021  ? metoprolol tartrate (LOPRESSOR) 50 MG tablet Take 50 mg by mouth daily.   10/31/2021  ?  Misc. Devices (WALKER) MISC 1 Device by Does not apply route as directed. 1 each 0   ? Multiple Vitamin (MULTIVITAMIN WITH MINERALS) TABS tablet Take 1 tablet by mouth daily. 30 tablet 0 10/31/2021  ? omeprazole (PRILOSEC) 40 MG capsule Take 40 mg by mouth daily.   10/31/2021  ? polyethylene glycol (MIRALAX / GLYCOLAX) 17 g packet Take 17 g by mouth daily as needed for mild constipation. 30 each 0 PRN at PRN  ? potassium chloride (KLOR-CON M) 10 MEQ tablet Take 10 mEq by mouth daily.   10/31/2021  ? potassium chloride (KLOR-CON) 10 MEQ tablet Take 10 mEq by mouth daily.     ? predniSONE (DELTASONE) 5 MG tablet Take 7.5 mg by mouth daily.  3 10/31/2021  ? sulfamethoxazole-trimethoprim (BACTRIM DS) 800-160 MG tablet Take 1 tablet by mouth.   10/31/2021  ? ?Scheduled:  ?Infusions:  ?PRN:  ?Anti-infectives (From admission, onward)  ? ? None  ? ?  ? ? ?Assessment: ?Pharmacy consulted for heparin for VTE treatment. Directed duplex of the left  lower extremity positive for DVT of the distal left femoral vein. No DOAC PTA noted.  ? ?Goal of Therapy:  ?Heparin level 0.3-0.7 units/ml ?Monitor platelets by anticoagulation protocol: Yes ? ?4/19 2349 HL 0.6, therapeutic x 1 ?4/20 0547 HL 0.84, supratherapeutic ?  ?Plan:  ?Decrease heparin infusion to 850 units/hr ?Recheck HL in 8 hrs after rate change ?CBC daily while on heparin ? ?Otelia Sergeant, PharmD, MBA ?11/04/2021 ?6:55 AM ? ? ? ?

## 2021-11-04 NOTE — Progress Notes (Signed)
[facility]  - [place] at [facility] ? ? ?PATIENT NAME: [name] [name]   ? ?MR#:  [id number] ? ?DATE OF BIRTH:  1945/12/08 ? ?SUBJECTIVE:  ? ?no family at bedside. Patient came in with significant weakness and are right lower extremity swelling with pain unable to ambulate at home. Recently discharged two weeks ago from rehab after second right hip surgery. Found to have extensive right lower extremity DVT. NPO for thrombectomy ? ? ?VITALS:  ?Blood pressure 139/81, pulse 95, temperature 98.2 ?F (36.8 ?C), resp. rate 16, height 5\' 4"  (1.626 m), weight 59 kg, SpO2 100 %. ? ?PHYSICAL EXAMINATION:  ? ?GENERAL:  76 y.o.-year-old patient lying in the bed with no acute distress.  ?LUNGS: Normal breath sounds bilaterally, no wheezing, rales, rhonchi.  ?CARDIOVASCULAR: S1, S2 normal. No murmurs, rubs, or gallops.  ?ABDOMEN: Soft, nontender, nondistended. Bowel sounds present.  ?EXTREMITIES: right LE decreased ROM ?NEUROLOGIC: nonfocal  patient is alert and awake ?SKIN: dry skin ? ?LABORATORY PANEL:  ?CBC ?Recent Labs  ?Lab 11/04/21 ?11/06/21  ?WBC 8.6  ?HGB 10.6*  ?HCT 35.4*  ?PLT 188  ? ? ?Chemistries  ?Recent Labs  ?Lab 11/04/21 ?11/06/21  ?NA 141  ?K 3.7  ?CL 110  ?CO2 24  ?GLUCOSE 85  ?BUN 55*  ?CREATININE 1.23*  ?CALCIUM 9.1  ? ?Cardiac Enzymes ?No results for input(s): TROPONINI in the last 168 hours. ?RADIOLOGY:  ?PERIPHERAL VASCULAR CATHETERIZATION ? ?Result Date: 11/04/2021 ?See surgical note for result. ? ?11/06/2021 Venous Img Lower Unilateral Left ? ?Result Date: 11/03/2021 ?CLINICAL DATA:  76 year old female with left lower leg pain and swelling for 3 weeks EXAM: LEFT LOWER EXTREMITY VENOUS DOPPLER ULTRASOUND TECHNIQUE: Gray-scale sonography with graded compression, as well as color Doppler and duplex ultrasound were performed to evaluate the lower extremity deep venous systems from the level of the common femoral vein and including the common femoral, femoral, profunda femoral, popliteal and calf veins including  the posterior tibial, peroneal and gastrocnemius veins when visible. The superficial great saphenous vein was also interrogated. Spectral Doppler was utilized to evaluate flow at rest and with distal augmentation maneuvers in the common femoral, femoral and popliteal veins. COMPARISON:  None. FINDINGS: Contralateral Common Femoral Vein: Noncompressible common femoral vein with minimal flow maintained. Thrombus extends into the pelvis. Common Femoral Vein: No evidence of thrombus. Normal compressibility, respiratory phasicity and response to augmentation. Saphenofemoral Junction: No evidence of thrombus. Normal compressibility and flow on color Doppler imaging. Profunda Femoral Vein: No evidence of thrombus. Normal compressibility and flow on color Doppler imaging. Femoral Vein: Proximal femoral vein patent and compressible. Occlusive DVT of the distal femoral vein. Popliteal Vein: No evidence of thrombus. Normal compressibility, respiratory phasicity and response to augmentation. Calf Veins: No evidence of thrombus. Normal compressibility and flow on color Doppler imaging. Superficial Great Saphenous Vein: No evidence of thrombus. Normal compressibility and flow on color Doppler imaging. Other Findings:  None. IMPRESSION: Directed duplex of the left lower extremity positive for DVT of the distal left femoral vein. The directed duplex is also positive for the contralateral right, with proximal DVT involving right common femoral vein extending above the hip into the pelvis. Results will be called by telephone at the time of interpretation on 11/03/2021 at 1:58 pm to the location of the patient in the emergency department by ultrasound technologist completing the study. Electronically Signed   By: 11/05/2021 D.O.   On: 11/03/2021 13:58   ? ?Assessment and Plan ? ?[name] [name] is an  76 y.o. female with autoimmune hepatitis, HTN, CKD who is s/p recent hip fracture who represented after SNF with a recurrent fall.   Patient states she has been at home has been walking with a cane but mostly has been sedentary.  Yesterday patient states she was unable to get up out of her couch due to weakness and her legs felt heavy ? ? Duplex Doppler showed bilateral DVT with extension up into the pelvis.  Patient was seen by vascular surgery who are planning on a thrombectomy . ?On IV heparin gtt ? ? ?Bilateral DVT with extension above the hip into the pelvis ?--Patient  on heparin drip with pharmacy assistance ?--Patient seen by Dr. Evie Lacks, vascular surgery who is planning on possible thrombectomy  today ?  ?Acute on chronic kidney injury ?--Patient has been stuck on her couch for the past 1 to 2 days with decreased p.o. intake ?--Will hydrate overnight and recheck creatinine in the morning ?--creat 1.70--1.2 ?  ?HTN ?--Patient is normotensive despite not taking her amlodipine and metoprolol since the day 10/31/2021 ?--We will hold these meds while we hydrate patient ?--These can be restarted once BP has rebounded with hydration. ?  ?Autoimmune hepatitis ?--Continue prednisone and azathioprine ?  ?Debilitation s/p hip fracture ?--Patient will benefit from PT evaluation after thrombectomy ? ?Procedures: ?Family communication :left message for son gwenevere goga ?Consults : vascular surgery ?CODE STATUS: DNR ?DVT Prophylaxis : heparin drip ?Level of care: Med-Surg ?Status is: Inpatient ?Remains inpatient appropriate because: bilateral DVT, vascular procedure, physical therapy, TOC for discharge planning ?  ? ?TOTAL TIME TAKING CARE OF THIS PATIENT: 30 minutes.  ?>50% time spent on counselling and coordination of care ? ?Note: This dictation was prepared with Dragon dictation along with smaller phrase technology. Any transcriptional errors that result from this process are unintentional. ? ?Enedina Finner M.D  ? ? ?[facility]  ? ?CC: ?Primary care physician; [facility]  ?

## 2021-11-04 NOTE — H&P (Signed)
[facility] ?History & Physical Update ? ?The patient was interviewed and re-examined.  Patient with LEFT distal femoral DVT and RIGHT CFV, pelvic DVT;Plan for Bilateral lower extremity thrombectomy. The patient's previous History and Physical has been reviewed and is unchanged.  There is no change in the plan of care. We plan to proceed with the scheduled procedure. ? ?Bertram Denver, MD ? ?11/04/2021, 10:06 AM ?  ?

## 2021-11-04 NOTE — Progress Notes (Signed)
[facility] ([facility]   ?    ?Referral received prior to hospice admission. MSW contacted spouse/Cornelius and shared above information. Remi Haggard reports that at this time, patient is receiving full scope of treatment.  ? ?At this time, he would like to hold off on any further hospice evaluation as he is interested in pursuing [facility] PT. [facility] to signoff and is available if needed. ?   ?Please call with any questions/concerns.  ?  ?Thank you for the opportunity to participate in this patient's care. ?  ?Odette Fraction, MSW ?[facility] Hospital Liaison  ?[phone] ?  ?

## 2021-11-05 DIAGNOSIS — I82413 Acute embolism and thrombosis of femoral vein, bilateral: Secondary | ICD-10-CM | POA: Diagnosis not present

## 2021-11-05 LAB — CBC
HCT: 28.6 % — ABNORMAL LOW (ref 36.0–46.0)
Hemoglobin: 8.8 g/dL — ABNORMAL LOW (ref 12.0–15.0)
MCH: 26.8 pg (ref 26.0–34.0)
MCHC: 30.8 g/dL (ref 30.0–36.0)
MCV: 87.2 fL (ref 80.0–100.0)
Platelets: 153 10*3/uL (ref 150–400)
RBC: 3.28 MIL/uL — ABNORMAL LOW (ref 3.87–5.11)
RDW: 16.3 % — ABNORMAL HIGH (ref 11.5–15.5)
WBC: 5.4 10*3/uL (ref 4.0–10.5)
nRBC: 0 % (ref 0.0–0.2)

## 2021-11-05 LAB — HEPARIN LEVEL (UNFRACTIONATED): Heparin Unfractionated: 0.44 IU/mL (ref 0.30–0.70)

## 2021-11-05 MED ORDER — APIXABAN 5 MG PO TABS
5.0000 mg | ORAL_TABLET | Freq: Two times a day (BID) | ORAL | Status: DC
Start: 2021-11-12 — End: 2021-11-08

## 2021-11-05 MED ORDER — MEDIHONEY WOUND/BURN DRESSING EX PSTE: 1.0000 "application " | PASTE | Freq: Every day | CUTANEOUS | Status: DC

## 2021-11-05 MED ORDER — MEDIHONEY WOUND/BURN DRESSING EX PSTE
1.0000 "application " | PASTE | Freq: Every day | CUTANEOUS | Status: DC
  Administered 2021-11-06 – 2021-11-08 (×3): 1 via TOPICAL
  Filled 2021-11-05: qty 44

## 2021-11-05 MED ORDER — APIXABAN 5 MG PO TABS
10.0000 mg | ORAL_TABLET | Freq: Two times a day (BID) | ORAL | Status: DC
  Administered 2021-11-05 – 2021-11-08 (×7): 10 mg via ORAL
  Filled 2021-11-05 (×7): qty 2

## 2021-11-05 NOTE — Plan of Care (Signed)

## 2021-11-05 NOTE — Consult Note (Signed)
WOC Nurse Consult Note: ?Patient receiving care in [facility]. Consult completed after review of record and image of sacral wound. ?Reason for Consult: decubitus + ?Wound type: stage 4 sacral wound ?Pressure Injury POA: Yes ?Measurement: To be provided by the bedside RN in the flowsheet section  ?Wound bed: see photo ?Drainage (amount, consistency, odor)  ?Periwound: erythematous ?Dressing procedure/placement/frequency: ?Cleanse the sacral wound with NS. Pat dry then apply a nickel thick layer of MediHoney directly to the wound  and any tunneled or undermined area then cover with gauze and secure with a foam dressing. Change dressing daily.  ? ?[place] nurse will not follow at this time.  Please re-consult the [place] team if needed. ? ?Val Riles, RN, MSN, CWOCN, CNS-BC, pager [phone]  ?

## 2021-11-05 NOTE — Progress Notes (Signed)
[facility]  - [place] [facility] ? ? ?PATIENT NAME: [name] [name]   ? ?MR#:  [id number] ? ?DATE OF BIRTH:  Feb 08, 1946 ? ?SUBJECTIVE:  ? ?no family at bedside. Patient came in with significant weakness and are right lower extremity swelling with pain unable to ambulate at home. Recently discharged two days ago from rehab after second right hip surgery. Found to have extensive right lower extremity DVT.  ? ?No new complaints today ? ?VITALS:  ?Blood pressure 127/78, pulse 92, temperature 98 ?F (36.7 ?C), resp. rate 16, height 5\' 4"  (1.626 m), weight 59 kg, SpO2 98 %. ? ?PHYSICAL EXAMINATION:  ? ?GENERAL:  76 y.o.-year-old patient lying in the bed with no acute distress.  ?LUNGS: Normal breath sounds bilaterally, no wheezing, rales, rhonchi.  ?CARDIOVASCULAR: S1, S2 normal. No murmurs, rubs, or gallops.  ?ABDOMEN: Soft, nontender, nondistended. Bowel sounds present.  ?EXTREMITIES: right LE decreased ROM ? ? ?NEUROLOGIC: nonfocal  patient is alert and awake ?SKIN:  ? ?LABORATORY PANEL: ?  ?CBC ?Recent Labs  ?Lab 11/05/21 ?0716  ?WBC 5.4  ?HGB 8.8*  ?HCT 28.6*  ?PLT 153  ? ? ? ?Chemistries  ?Recent Labs  ?Lab 11/04/21 ?11/06/21  ?NA 141  ?K 3.7  ?CL 110  ?CO2 24  ?GLUCOSE 85  ?BUN 55*  ?CREATININE 1.23*  ?CALCIUM 9.1  ? ? ?Cardiac Enzymes ?No results for input(s): TROPONINI in the last 168 hours. ?RADIOLOGY:  ?PERIPHERAL VASCULAR CATHETERIZATION ? ?Result Date: 11/04/2021 ?See surgical note for result. ? ?11/06/2021 Venous Img Lower Unilateral Left ? ?Result Date: 11/03/2021 ?CLINICAL DATA:  76 year old female with left lower leg pain and swelling for 3 weeks EXAM: LEFT LOWER EXTREMITY VENOUS DOPPLER ULTRASOUND TECHNIQUE: Gray-scale sonography with graded compression, as well as color Doppler and duplex ultrasound were performed to evaluate the lower extremity deep venous systems from the level of the common femoral vein and including the common femoral, femoral, profunda femoral, popliteal and calf veins including  the posterior tibial, peroneal and gastrocnemius veins when visible. The superficial great saphenous vein was also interrogated. Spectral Doppler was utilized to evaluate flow at rest and with distal augmentation maneuvers in the common femoral, femoral and popliteal veins. COMPARISON:  None. FINDINGS: Contralateral Common Femoral Vein: Noncompressible common femoral vein with minimal flow maintained. Thrombus extends into the pelvis. Common Femoral Vein: No evidence of thrombus. Normal compressibility, respiratory phasicity and response to augmentation. Saphenofemoral Junction: No evidence of thrombus. Normal compressibility and flow on color Doppler imaging. Profunda Femoral Vein: No evidence of thrombus. Normal compressibility and flow on color Doppler imaging. Femoral Vein: Proximal femoral vein patent and compressible. Occlusive DVT of the distal femoral vein. Popliteal Vein: No evidence of thrombus. Normal compressibility, respiratory phasicity and response to augmentation. Calf Veins: No evidence of thrombus. Normal compressibility and flow on color Doppler imaging. Superficial Great Saphenous Vein: No evidence of thrombus. Normal compressibility and flow on color Doppler imaging. Other Findings:  None. IMPRESSION: Directed duplex of the left lower extremity positive for DVT of the distal left femoral vein. The directed duplex is also positive for the contralateral right, with proximal DVT involving right common femoral vein extending above the hip into the pelvis. Results will be called by telephone at the time of interpretation on 11/03/2021 at 1:58 pm to the location of the patient in the emergency department by ultrasound technologist completing the study. Electronically Signed   By: 11/05/2021 D.O.   On: 11/03/2021 13:58   ? ?Assessment and Plan ? ?  [name] [name] is an 76 y.o. female with autoimmune hepatitis, HTN, CKD who is s/p recent hip fracture who represented after SNF with a recurrent fall.   Patient states she has been at home has been walking with a cane but mostly has been sedentary.  Yesterday patient states she was unable to get up out of her couch due to weakness and her legs felt heavy ? ? Duplex Doppler showed bilateral DVT with extension up into the pelvis.  Patient was seen by vascular surgery who are planning on a thrombectomy . ?On IV heparin gtt ? ? ?Bilateral DVT with extension above the hip into the pelvis ?--Patient  on heparin drip with pharmacy assistance ?--Patient seen by Dr. Evie Lacks, vascular surgery  ?--s/p  Catheter directed thrombolysis with right popliteal and femoral vein to the 4 mg of tPA ?  Mechanical thrombectomy to the right popliteal, superficial femoral, and common femoral veins ?--per Dr Dew--ok to change to po anticoagulation ?  ?Acute on chronic kidney injury ?--Patient has been stuck on her couch for the past 1 to 2 days with decreased p.o. intake ?--Will hydrate overnight and recheck creatinine in the morning ?--creat 1.70--1.2 ?  ?HTN ?--Patient is normotensive despite not taking her amlodipine and metoprolol since the day 10/31/2021 ?-- BP remains soft--hold po meds ?  ?Autoimmune hepatitis ?--Continue prednisone and azathioprine ?  ?Debilitation s/p hip fracture ?--PT to see pt ?TOC for d/c planning ? ?Per son--pt lives with husband--legally blind. They are not able to keep up with hygiene, ADL's and pt has had falls needing re  peated hip surgeries. ? ? ?Procedures: Right LE thrombectomy for extensive DVT ?Family communication :left message for son [name] [name] ?Consults : vascular surgery ?CODE STATUS: DNR ?DVT Prophylaxis : eliquis ?Level of care: Med-Surg ?Status is: Inpatient ?Remains inpatient appropriate because: bilateral DVT,  s/p vascular procedure, physical therapy, TOC for discharge planning ?  ? ?TOTAL TIME TAKING CARE OF THIS PATIENT: 30 minutes.  ?>50% time spent on counselling and coordination of care ? ?Note: This dictation was prepared with Dragon  dictation along with smaller phrase technology. Any transcriptional errors that result from this process are unintentional. ? ?Enedina Finner M.D  ? ? ?[facility]  ? ?CC: ?Primary care physician; [facility]  ?

## 2021-11-05 NOTE — Care Management Important Message (Signed)
Important Message ? ?Patient Details  ?Name: [name] [name] ?MRN: [id number] ?Date of Birth: August 29, 1945 ? ? ?Medicare Important Message Given:  N/A - LOS <3 / Initial given by admissions ? ? ? ? ?Johnell Comings ?11/05/2021, 1:49 PM ?

## 2021-11-05 NOTE — TOC Progression Note (Addendum)
Transition of Care (TOC) - Progression Note  ? ? ?Patient Details  ?Name: [name] [name] ?MRN: [id number] ?Date of Birth: [birth year] ? ?Transition of Care (TOC) CM/SW Contact  ?Breyona Swander E Milayna Rotenberg, LCSW ?Phone Number: ?11/05/2021, 2:26 PM ? ?Clinical Narrative:   Spoke to Tammy at [facility] who checked with their Business Office- they stated patient has used 36 SNF days. She still has SNF days, but would be in her copay days ($196 per day estimated). [facility] can take her back. PT pending, will follow up with son after.  ? ?2:43- PT rec SNF. Call to step son Greig Castilla. He stated he wants to talk to his Dad about the copays, but at this point he feels SNF is the only safe option for patient. He understands they would be in copay days and said he would need to talk to [facility] about a payment plan. He stated he is working on patient's home living conditions for patient and her spouse. He stated they have resubmitted the Medicaid application as of today as DSS said they never received the one submitted while patient was at [facility] previously. He stated he would like to see if [facility] could take patient as well, but otherwise would be ok with [facility]. CSW is starting SNF work up.  ? ?Expected Discharge Plan: Home w Home Health Services ?Barriers to Discharge: Continued Medical Work up ? ?Expected Discharge Plan and Services ?Expected Discharge Plan: Home w Home Health Services ?  ?Discharge Planning Services: CM Consult ?  ?Living arrangements for the past 2 months: Single Family Home ?                ?DME Arranged: N/A ?  ?  ?  ?  ?  ?  ?  ?  ?  ? ? ?Social Determinants of Health (SDOH) Interventions ?  ? ?Readmission Risk Interventions ?   ? View : No data to display.  ?  ?  ?  ? ? ?

## 2021-11-05 NOTE — Consult Note (Signed)
ANTICOAGULATION CONSULT NOTE  ? ?Pharmacy Consult for Heparin  ?Indication:  VTE Treatment ? ?Allergies  ?Allergen Reactions  ? Sulfa Antibiotics Nausea And Vomiting  ?  Pt has not had a sulfa drug in years and is not certain of reaction but suspects nausea and vomiting.  ? Sulfasalazine Nausea And Vomiting  ? ? ?Patient Measurements: ?Height: 5\' 4"  (162.6 cm) ?Weight: 59 kg (130 lb 1.1 oz) ?IBW/kg (Calculated) : 54.7 ?Heparin Dosing Weight: 59 kg ? ?Vital Signs: ?Temp: 98 ?F (36.7 ?C) (04/21 0424) ?BP: 127/78 (04/21 0424) ?Pulse Rate: 92 (04/21 0424) ? ?Labs: ?Recent Labs  ?  11/03/21 ?1410 11/03/21 ?1514 11/03/21 ?2349 11/04/21 ?11/06/21 11/04/21 ?2223 11/05/21 ?11/07/21  ?HGB 13.3  --   --  10.6*  --  8.8*  ?HCT 43.6  --   --  35.4*  --  28.6*  ?PLT 331  --   --  188  --  153  ?APTT  --  28  --   --   --   --   ?LABPROT  --  14.1  --   --   --   --   ?INR  --  1.1  --   --   --   --   ?HEPARINUNFRC  --   --    < > 0.84* 0.38 0.44  ?CREATININE 1.70*  --   --  1.23*  --   --   ? < > = values in this interval not displayed.  ? ? ? ?Estimated Creatinine Clearance: 33.6 mL/min (A) (by C-G formula based on SCr of 1.23 mg/dL (H)). ? ? ?Medical History: ?Past Medical History:  ?Diagnosis Date  ? Abnormal LFTs (liver function tests)   ? Autoimmune hepatitis (HCC) 2018  ? Autoimmune hepatitis (HCC)   ? Autoimmune hepatitis treated with steroids (HCC)   ? DOE (dyspnea on exertion)   ? GERD (gastroesophageal reflux disease)   ? Hepatitis   ? Hyperlipidemia   ? Hypertension   ? Iron deficiency anemia due to chronic blood loss 05/27/2021  ? Osteoporosis   ? ? ?Medications:  ?Medications Prior to Admission  ?Medication Sig Dispense Refill Last Dose  ? diphenhydramine-acetaminophen (TYLENOL PM) 25-500 MG TABS tablet Take 1 tablet by mouth at bedtime as needed.   11/02/2021 at NIGHT  ? acetaminophen (TYLENOL) 325 MG tablet Take 2 tablets (650 mg total) by mouth every 6 (six) hours as needed for mild pain or moderate pain (pain score 1-3  or temp > 100.5).   PRN at PRN  ? amLODipine (NORVASC) 5 MG tablet Take 5 mg by mouth daily.   10/31/2021  ? aspirin 81 MG chewable tablet Chew 1 tablet (81 mg total) by mouth 2 (two) times daily.   10/31/2021  ? azaTHIOprine (IMURAN) 50 MG tablet Take 25 mg by mouth daily.  0 10/31/2021  ? Calcium Carbonate Antacid 600 MG chewable tablet Chew 600 mg by mouth.   10/31/2021  ? calcium citrate-vitamin D (CITRACAL+D) 315-200 MG-UNIT tablet Take 1 tablet by mouth 2 (two) times daily.   10/31/2021  ? cyanocobalamin 100 MCG tablet Take 100 mcg by mouth daily.   10/31/2021  ? estradiol (ESTRACE) 0.1 MG/GM vaginal cream Place 1 Applicatorful vaginally 3 (three) times a week.   PRN at PRN  ? feeding supplement (ENSURE ENLIVE / ENSURE PLUS) LIQD Take 237 mLs by mouth 2 (two) times daily between meals. 14220 mL 0   ? Ferrous Fumarate (HEMOCYTE - 106 MG FE) 324 (  106 Fe) MG TABS tablet Take 1 tablet by mouth daily.   10/31/2021  ? HYDROcodone-acetaminophen (NORCO/VICODIN) 5-325 MG tablet Take 1 tablet by mouth daily as needed.   10/31/2021  ? metoprolol tartrate (LOPRESSOR) 50 MG tablet Take 50 mg by mouth daily.   10/31/2021  ? Misc. Devices (WALKER) MISC 1 Device by Does not apply route as directed. 1 each 0   ? Multiple Vitamin (MULTIVITAMIN WITH MINERALS) TABS tablet Take 1 tablet by mouth daily. 30 tablet 0 10/31/2021  ? omeprazole (PRILOSEC) 40 MG capsule Take 40 mg by mouth daily.   10/31/2021  ? polyethylene glycol (MIRALAX / GLYCOLAX) 17 g packet Take 17 g by mouth daily as needed for mild constipation. 30 each 0 PRN at PRN  ? potassium chloride (KLOR-CON M) 10 MEQ tablet Take 10 mEq by mouth daily.   10/31/2021  ? potassium chloride (KLOR-CON) 10 MEQ tablet Take 10 mEq by mouth daily.     ? predniSONE (DELTASONE) 5 MG tablet Take 7.5 mg by mouth daily.  3 10/31/2021  ? sulfamethoxazole-trimethoprim (BACTRIM DS) 800-160 MG tablet Take 1 tablet by mouth.   10/31/2021  ? ?Scheduled:  ?Infusions:  ?PRN:  ?Anti-infectives (From  admission, onward)  ? ? Start     Dose/Rate Route Frequency Ordered Stop  ? 11/04/21 1200  ceFAZolin (ANCEF) IVPB 2g/100 mL premix       ? 2 g ?200 mL/hr over 30 Minutes Intravenous  Once 11/04/21 1151 11/04/21 1221  ? ?  ? ? ?Assessment: ?Pharmacy consulted for heparin for VTE treatment. Directed duplex of the left lower extremity positive for DVT of the distal left femoral vein. No DOAC PTA noted.  ? ?Goal of Therapy:  ?Heparin level 0.3-0.7 units/ml ?Monitor platelets by anticoagulation protocol: Yes ? ?4/19 2349 HL 0.6, therapeutic x 1 ?4/20 0547 HL 0.84, supratherapeutic ?4/20 2223 HL 0.38, therapeutic x 1 ?4/21 0716 HL 0.44, therapeutic x 2 ?  ?Plan:  ?Continue heparin infusion at 850 units/hr ?Recheck HL w/ AM labs ?CBC daily while on heparin ? ?Bettey Costa, PharmD ?Clinical Pharmacist ?11/05/2021 ?8:01 AM ? ? ? ? ?

## 2021-11-05 NOTE — NC FL2 (Signed)
?[place] MEDICAID FL2 LEVEL OF CARE SCREENING TOOL  ?  ? ?IDENTIFICATION  ?Patient Name: ?[name] [name] Birthdate: Aug 01, 1945 Sex: female Admission Date (Current Location): ?11/03/2021  ?[place] and [place] Number: ? [place] ?  Facility and Address:  ?[facility], [address] [place], [place] [address] ?     Provider Number: ?[id number]  ?Attending Physician Name and Address:  ?[name] Mandes, MD ? Relative Name and Phone Number:  ?[name], [name]     [phone] ?   ?Current Level of Care: ?Hospital Recommended Level of Care: ?[place] Prior Approval Number: ?  ? ?Date Approved/Denied: ?  PASRR Number: ?[id number] ? ?Discharge Plan: ?  ?  ? ?Current Diagnoses: ?Patient Active Problem List  ? Diagnosis Date Noted  ? DVT (deep venous thrombosis) ([place]) 11/03/2021  ? Seizure ([place]) 09/20/2021  ? Pressure injury of skin 09/19/2021  ? Periprosthetic intertrochanteric fracture of femur 09/18/2021  ? Closed right hip fracture ([place]) 09/18/2021  ? Normocytic anemia 09/18/2021  ? Acute urinary retention 08/22/2021  ? Acute postoperative anemia due to expected blood loss 08/22/2021  ? Thrombocytopenia ([place]) 08/22/2021  ? Acute kidney injury superimposed on CKD ([place]) 08/21/2021  ? Toenail fungus 08/21/2021  ? Hyponatremia 08/21/2021  ? Nausea 08/20/2021  ? Hypertension   ? GERD (gastroesophageal reflux disease)   ? Stage 3a chronic kidney disease (CKD) ([place])   ? Closed hip fracture requiring operative repair, right, sequela 08/19/2021  ? Autoimmune hepatitis ([place]) 08/19/2021  ? Iron deficiency anemia due to chronic blood loss 05/27/2021  ? Hyperparathyroidism due to renal insufficiency ([place]) 07/08/2019  ? Erosive gastritis 12/25/2018  ? Abnormal LFTs 02/20/2017  ? Senile osteoporosis 02/13/2017  ? ? ?Orientation RESPIRATION BLADDER Height & Weight   ?  ?Self, Time, Situation ? Normal Incontinent Weight: 130 lb 1.1 oz (59 kg) ?Height:  5\' 4"  (162.6 cm)  ?BEHAVIORAL SYMPTOMS/MOOD  NEUROLOGICAL BOWEL NUTRITION STATUS  ?      Diet (heart)  ?AMBULATORY STATUS COMMUNICATION OF NEEDS Skin   ?Extensive Assist Verbally Other (Comment), Bruising (wound posterior sacrum) ?  ?  ?  ?    ?     ?     ? ? ?Personal Care Assistance Level of Assistance  ?Bathing, Feeding, Dressing Bathing Assistance: Maximum assistance ?Feeding assistance: Maximum assistance ?Dressing Assistance: Maximum assistance ?   ? ?Functional Limitations Info  ?    ?  ?   ? ? ?SPECIAL CARE FACTORS FREQUENCY  ?PT (By licensed PT), OT (By licensed OT)   ?  ?PT Frequency: 5 times per week ?OT Frequency: 5 times per week ?  ?  ?  ?   ? ? ?Contractures    ? ? ?Additional Factors Info  ?Code Status, Allergies Code Status Info: DNR ?Allergies Info: sulfa antibiotics, sulfasalazine ?  ?  ?  ?   ? ?Current Medications (11/05/2021):  This is the current hospital active medication list ?Current Facility-Administered Medications  ?Medication Dose Route Frequency Provider Last Rate Last Admin  ? apixaban (ELIQUIS) tablet 10 mg  10 mg Oral BID [name] Mandes, MD   10 mg at 11/05/21 [id number]  ? Followed by  ? [START ON 11/12/2021] apixaban (ELIQUIS) tablet 5 mg  5 mg Oral BID [name] Mandes, MD      ? azaTHIOprine Ilean Skill) tablet 25 mg  25 mg Oral Daily Bonnell Public Tublu, MD   25 mg at 11/05/21 [id number]  ? feeding supplement (ENSURE ENLIVE / ENSURE PLUS) liquid 237  mL  237 mL Oral BID BM Bonnell Public Tublu, MD   237 mL at 11/04/21 1416  ? hydrALAZINE (APRESOLINE) injection 10 mg  10 mg Intravenous Q6H PRN [name] Mandes, MD      ? HYDROcodone-acetaminophen (NORCO/VICODIN) 5-325 MG per tablet 1 tablet  1 tablet Oral Q8H PRN Vashti Hey, MD   1 tablet at 11/04/21 1556  ? leptospermum manuka honey (MEDIHONEY) paste 1 application.  1 application. Topical Daily [name] Mandes, MD      ? pantoprazole (PROTONIX) EC tablet 40 mg  40 mg Oral Daily Bonnell Public Tublu, MD   40 mg at 11/05/21 [id number]  ? polyethylene glycol (MIRALAX / GLYCOLAX) packet  17 g  17 g Oral Daily PRN Bonnell Public Tublu, MD      ? predniSONE (DELTASONE) tablet 7.5 mg  7.5 mg Oral Daily Bonnell Public Tublu, MD   7.5 mg at 11/05/21 [id number]  ? ? ? ?Discharge Medications: ?Please see discharge summary for a list of discharge medications. ? ?Relevant Imaging Results: ? ?Relevant Lab Results: ? ? ?Additional Information ?SS #: [id number] ? ?[name] Evilsizer E Alandra Sando, LCSW ? ? ? ? ?

## 2021-11-05 NOTE — Evaluation (Signed)
Physical Therapy Evaluation ?Patient Details ?Name: [name] [name] ?MRN: [id number] ?DOB: [birth year] ?Today's Date: 11/05/2021 ? ?History of Present Illness ? Pt is a 76 yo female that underwent IVC filter placement as well as thrombolysis and thrombectomy for bilateral LE DVTs. PMH of autoimmune hepatitis, HTN, CKD who is s/p recent hip fracture and conversion to THA, seizures. ?  ?Clinical Impression ? Pt alert, agreeable to PT with encouragement, reported diffuse/generalized BLE pain, referencing 7/10 bilateral groin pain. Supine to sit with use of bed rails and HOB elevated modA, though pt did initiate and manage most of moving her BLE. Sit <> stand twice with modA and RW, pt reliant on RW stabilization by PT, and able to stand for several longs minutes to allow for pericare due to BM. modAx2 with RW to take 1-2 side steps towards HOB, and maxAx2 to return to supine due to increased LE pain.  Overall the patient demonstrated deficits (see "PT Problem List") that impede the patient's functional abilities, safety, and mobility and would benefit from skilled PT intervention. Recommendation at this time is SNF due to current level of assistance needed.    ?   ? ?Recommendations for follow up therapy are one component of a multi-disciplinary discharge planning process, led by the attending physician.  Recommendations may be updated based on patient status, additional functional criteria and insurance authorization. ? ?Follow Up Recommendations Skilled nursing-short term rehab (<3 hours/day) ? ?  ?Assistance Recommended at Discharge Frequent or constant Supervision/Assistance  ?Patient can return home with the following ? A lot of help with walking and/or transfers;A lot of help with bathing/dressing/bathroom;Assistance with feeding;Assist for transportation;Help with stairs or ramp for entrance ? ?  ?Equipment Recommendations Other (comment) (TBD at next venue of care)  ?Recommendations for Other Services ?    ?   ?Functional Status Assessment Patient has had a recent decline in their functional status and demonstrates the ability to make significant improvements in function in a reasonable and predictable amount of time.  ? ?  ?Precautions / Restrictions Precautions ?Precautions: Fall ?Restrictions ?Weight Bearing Restrictions: No  ? ?  ? ?Mobility ? Bed Mobility ?Overal bed mobility: Needs Assistance ?Bed Mobility: Supine to Sit, Sit to Supine ?  ?  ?Supine to sit: Mod assist, HOB elevated ?Sit to supine: Max assist, +2 for physical assistance ?  ?  ?  ? ?Transfers ?Overall transfer level: Needs assistance ?Equipment used: Rolling walker (2 wheels) ?Transfers: Sit to/from Stand ?Sit to Stand: Mod assist ?  ?  ?  ?  ?  ?  ?  ? ?Ambulation/Gait ?  ?  ?  ?  ?  ?  ?  ?General Gait Details: unable at this time ? ?Stairs ?  ?  ?  ?  ?  ? ?Wheelchair Mobility ?  ? ?Modified Rankin (Stroke Patients Only) ?  ? ?  ? ?Balance Overall balance assessment: Needs assistance ?Sitting-balance support: Feet supported ?Sitting balance-Leahy Scale: Fair ?  ?  ?Standing balance support: Reliant on assistive device for balance, Bilateral upper extremity supported ?Standing balance-Leahy Scale: Poor ?  ?  ?  ?  ?  ?  ?  ?  ?  ?  ?  ?  ?   ? ? ? ?Pertinent Vitals/Pain Pain Assessment ?Pain Assessment: 0-10 ?Pain Score: 8  ?Pain Location: bilateral groin ?Pain Descriptors / Indicators: Aching, Sore, Crying, Grimacing, Guarding, Moaning ?Pain Intervention(s): Limited activity within patient's tolerance, Monitored during session, Repositioned  ? ? ?Home  Living Family/patient expects to be discharged to:: Private residence ?Living Arrangements: Spouse/significant other ?Available Help at Discharge: Family;Available 24 hours/day ?Type of Home: Mobile home ?Home Access: Stairs to enter ?Entrance Stairs-Rails: Right;Left;Can reach both ?Entrance Stairs-Number of Steps: 5 ?  ?Home Layout: One level ?Home Equipment: Agricultural consultant (2 wheels);Cane -  quad ?Additional Comments: PLOF gathered from chart review  ?  ?Prior Function Prior Level of Function : Independent/Modified Independent ?  ?  ?  ?  ?  ?  ?Mobility Comments: Pt reports she's ambulatory with QD ?ADLs Comments: indepedent in ADLs. still driving ?  ? ? ?Hand Dominance  ? Dominant Hand: Right ? ?  ?Extremity/Trunk Assessment  ? Upper Extremity Assessment ?Upper Extremity Assessment: Generalized weakness ?  ? ?Lower Extremity Assessment ?Lower Extremity Assessment: Generalized weakness (able to lift LLE against gravity, some assist needed for RLE. unable to perform full MMT due to elevated pain) ?  ? ?Cervical / Trunk Assessment ?Cervical / Trunk Assessment: Normal  ?Communication  ? Communication: No difficulties  ?Cognition Arousal/Alertness: Awake/alert ?Behavior During Therapy: [facility] for tasks assessed/performed ?Overall Cognitive Status: Within Functional Limits for tasks assessed ?  ?  ?  ?  ?  ?  ?  ?  ?  ?  ?  ?  ?  ?  ?  ?  ?General Comments: pt oriented x4 ?  ?  ? ?  ?General Comments   ? ?  ?Exercises    ? ?Assessment/Plan  ?  ?PT Assessment Patient needs continued PT services  ?PT Problem List Decreased strength;Decreased mobility;Decreased range of motion;Decreased activity tolerance;Decreased balance;Decreased knowledge of precautions;Decreased knowledge of use of DME;Pain ? ?   ?  ?PT Treatment Interventions DME instruction;Therapeutic exercise;Gait training;Balance training;Stair training;Neuromuscular re-education;Functional mobility training;Patient/family education;Therapeutic activities   ? ?PT Goals (Current goals can be found in the Care Plan section)  ?Acute Rehab PT Goals ?Patient Stated Goal: to have less pain ?PT Goal Formulation: With patient ?Time For Goal Achievement: 11/19/21 ?Potential to Achieve Goals: Fair ? ?  ?Frequency Min 2X/week ?  ? ? ?Co-evaluation   ?  ?  ?  ?  ? ? ?  ?AM-PAC PT "6 Clicks" Mobility  ?Outcome Measure Help needed turning from your back to your side  while in a flat bed without using bedrails?: A Lot ?Help needed moving from lying on your back to sitting on the side of a flat bed without using bedrails?: A Lot ?Help needed moving to and from a bed to a chair (including a wheelchair)?: A Lot ?Help needed standing up from a chair using your arms (e.g., wheelchair or bedside chair)?: A Lot ?Help needed to walk in hospital room?: A Lot ?Help needed climbing 3-5 steps with a railing? : Total ?6 Click Score: 11 ? ?  ?End of Session Equipment Utilized During Treatment: Gait belt ?Activity Tolerance: Patient limited by pain;Patient limited by fatigue ?Patient left: in bed;with call bell/phone within reach;with bed alarm set ?Nurse Communication: Mobility status ?PT Visit Diagnosis: Other abnormalities of gait and mobility (R26.89);Difficulty in walking, not elsewhere classified (R26.2);Pain;Muscle weakness (generalized) (M62.81) ?Pain - Right/Left: Left (and Right) ?Pain - part of body: Knee;Leg ?  ? ?Time: 7829-5621 ?PT Time Calculation (min) (ACUTE ONLY): 27 min ? ? ?Charges:   PT Evaluation ?$PT Eval Moderate Complexity: 1 Mod ?PT Treatments ?$Therapeutic Activity: 23-37 mins ?  ?   ? ? ?Olga Coaster PT, DPT ?2:42 PM,11/05/21 ? ? ?

## 2021-11-05 NOTE — Evaluation (Signed)
Occupational Therapy Evaluation ?Patient Details ?Name: [name] [name] ?MRN: [id number] ?DOB: 1945/11/23 ?Today's Date: 11/05/2021 ? ? ?History of Present Illness Pt is a 76 yo female that underwent IVC filter placement as well as thrombolysis and thrombectomy for bilateral LE DVTs. PMH of autoimmune hepatitis, HTN, CKD who is s/p recent hip fracture and conversion to THA, seizures.  ? ?Clinical Impression ?  ?Ms. Probus presents with generalized weakness, limited endurance, impaired balanced and 8/10 pain in groin and b/l LE. Pt expresses fear of moving and fear of falling. Required Mod A with continual reassurance and cueing for sit<>stand and stand-pivot transfer. Demonstrates fair-poor standing balance and requests return to sitting after < 30 seconds in standing. Required Max A for toileting and peri-care. Pt is primary caregiver at her home, with spouse who has visual impairments. Pt normally handles all shopping, cooking, cleaning. She drives and has been Mod I in ADL and fxl mobility until fall & hip fx earlier this year. Given that pt is currently far from her baseline level of fxl mobility, recommend ongoing OT while hospitalized, with DC to SNF to assist with return to PLOF.    ? ?Recommendations for follow up therapy are one component of a multi-disciplinary discharge planning process, led by the attending physician.  Recommendations may be updated based on patient status, additional functional criteria and insurance authorization.  ? ?Follow Up Recommendations ? Skilled nursing-short term rehab (<3 hours/day)  ?  ?Assistance Recommended at Discharge Intermittent Supervision/Assistance  ?Patient can return home with the following A lot of help with walking and/or transfers;A lot of help with bathing/dressing/bathroom;Assist for transportation ? ?  ?Functional Status Assessment ? Patient has had a recent decline in their functional status and demonstrates the ability to make significant improvements in  function in a reasonable and predictable amount of time.  ?Equipment Recommendations ? None recommended by OT  ?  ?Recommendations for Other Services   ? ? ?  ?Precautions / Restrictions Precautions ?Precautions: Fall ?Restrictions ?Weight Bearing Restrictions: No  ? ?  ? ?Mobility Bed Mobility ?Overal bed mobility: Needs Assistance ?Bed Mobility: Supine to Sit ?  ?  ?Supine to sit: Mod assist, HOB elevated ?  ?  ?  ?  ? ?Transfers ?Overall transfer level: Needs assistance ?Equipment used: Rolling walker (2 wheels) ?Transfers: Sit to/from Stand ?Sit to Stand: Mod assist ?  ?  ?  ?  ?  ?  ?  ? ?  ?Balance Overall balance assessment: Needs assistance ?Sitting-balance support: Feet supported ?Sitting balance-Leahy Scale: Good ?  ?  ?Standing balance support: Reliant on assistive device for balance, Bilateral upper extremity supported ?Standing balance-Leahy Scale: Poor ?Standing balance comment: Pt repeatedly expresses fear of falling ?  ?  ?  ?  ?  ?  ?  ?  ?  ?  ?  ?   ? ?ADL either performed or assessed with clinical judgement  ? ?ADL Overall ADL's : Needs assistance/impaired ?  ?  ?  ?  ?  ?  ?  ?  ?  ?  ?  ?  ?Toilet Transfer: Maximal assistance;Stand-pivot ?  ?Toileting- Clothing Manipulation and Hygiene: Maximal assistance ?  ?  ?  ?  ?   ? ? ? ?Vision   ?   ?   ?Perception   ?  ?Praxis   ?  ? ?Pertinent Vitals/Pain Pain Assessment ?Pain Score: 8  ?Pain Location: bilateral groin ?Pain Descriptors / Indicators: Aching, Sore, Crying, Grimacing, Guarding,  Moaning ?Pain Intervention(s): Limited activity within patient's tolerance, Monitored during session, Repositioned  ? ? ? ?Hand Dominance Right ?  ?Extremity/Trunk Assessment Upper Extremity Assessment ?Upper Extremity Assessment: Generalized weakness ?  ?Lower Extremity Assessment ?Lower Extremity Assessment: Generalized weakness ?  ?Cervical / Trunk Assessment ?Cervical / Trunk Assessment: Normal ?  ?Communication Communication ?Communication: No difficulties ?   ?Cognition Arousal/Alertness: Awake/alert ?Behavior During Therapy: [facility] for tasks assessed/performed ?Overall Cognitive Status: Within Functional Limits for tasks assessed ?  ?  ?  ?  ?  ?  ?  ?  ?  ?  ?  ?  ?  ?  ?  ?  ?General Comments: pt oriented x4 ?  ?  ?General Comments    ? ?  ?Exercises Other Exercises ?Other Exercises: Educ re: importance of OOB mobility, falls prevention, DME use ?  ?Shoulder Instructions    ? ? ?Home Living Family/patient expects to be discharged to:: Private residence ?Living Arrangements: Spouse/significant other ?Available Help at Discharge: Family;Available 24 hours/day ?Type of Home: Mobile home ?Home Access: Stairs to enter ?Entrance Stairs-Number of Steps: 5 ?Entrance Stairs-Rails: Right;Left;Can reach both ?Home Layout: One level ?  ?  ?Bathroom Shower/Tub: Tub/shower unit ?  ?  ?  ?  ?Home Equipment: Conservation officer, nature (2 wheels);Cane - quad ?  ?Additional Comments: PLOF gathered from chart review ?  ? ?  ?Prior Functioning/Environment Prior Level of Function : Independent/Modified Independent ?  ?  ?  ?  ?  ?  ?Mobility Comments: Pt reports she's ambulatory with QD ?ADLs Comments: indepedent in ADLs. still driving ?  ? ?  ?  ?OT Problem List: Decreased strength;Impaired balance (sitting and/or standing);Pain;Decreased range of motion;Decreased activity tolerance;Decreased knowledge of use of DME or AE ?  ?   ?OT Treatment/Interventions: Self-care/ADL training;Therapeutic exercise;Patient/family education;Balance training;Energy conservation;DME and/or AE instruction;Therapeutic activities  ?  ?OT Goals(Current goals can be found in the care plan section) Acute Rehab OT Goals ?Patient Stated Goal: to go home ?OT Goal Formulation: With patient ?Time For Goal Achievement: 11/19/21 ?Potential to Achieve Goals: Good ?ADL Goals ?Pt Will Perform Grooming: with modified independence;standing ?Pt Will Perform Lower Body Dressing: with min assist;sitting/lateral leans;sit to/from stand ?Pt  Will Transfer to Toilet: with supervision;regular height toilet (using LRAD)  ?OT Frequency: Min 2X/week ?  ? ?Co-evaluation   ?  ?  ?  ?  ? ?  ?AM-PAC OT "6 Clicks" Daily Activity     ?Outcome Measure Help from another person eating meals?: None ?Help from another person taking care of personal grooming?: A Little ?Help from another person toileting, which includes using toliet, bedpan, or urinal?: A Lot ?Help from another person bathing (including washing, rinsing, drying)?: A Lot ?Help from another person to put on and taking off regular upper body clothing?: A Little ?Help from another person to put on and taking off regular lower body clothing?: A Lot ?6 Click Score: 16 ?  ?End of Session Equipment Utilized During Treatment: Gait belt;Rolling walker (2 wheels) ?Nurse Communication: Mobility status ? ?Activity Tolerance: Patient tolerated treatment well ?Patient left: in chair;with call bell/phone within reach;with chair alarm set ? ?OT Visit Diagnosis: Unsteadiness on feet (R26.81);Muscle weakness (generalized) (M62.81);History of falling (Z91.81);Pain  ?              ?Time: [id number] ?OT Time Calculation (min): 32 min ?Charges:  OT General Charges ?$OT Visit: 1 Visit ?OT Evaluation ?$OT Eval Moderate Complexity: 1 Mod ?OT Treatments ?$Self Care/Home Management : 23-37  mins ?Josiah Lobo, PhD, MS, OTR/L ?11/05/21, 3:42 PM ? ?

## 2021-11-06 DIAGNOSIS — I82413 Acute embolism and thrombosis of femoral vein, bilateral: Secondary | ICD-10-CM | POA: Diagnosis not present

## 2021-11-06 MED ORDER — ZINC OXIDE 40 % EX OINT
TOPICAL_OINTMENT | CUTANEOUS | Status: DC | PRN
  Filled 2021-11-06: qty 113

## 2021-11-06 MED ORDER — ACETAMINOPHEN 325 MG PO TABS: 650.0000 mg | ORAL_TABLET | Freq: Four times a day (QID) | ORAL | Status: DC | PRN

## 2021-11-06 NOTE — Progress Notes (Signed)
[place] at [facility] ? ? ?PATIENT NAME: [name] [name]   ? ?MR#:  [id number] ? ?DATE OF BIRTH:  Oct 25, 1945 ? ?SUBJECTIVE:  ? ?no family at bedside. Patient came in with significant weakness and are right lower extremity swelling with pain unable to ambulate at home. Recently discharged two days ago from rehab after second right hip surgery. Found to have extensive right lower extremity DVT.  ? ?No new complaints today. Did work with PT yday ?No new issues per RN ? ?VITALS:  ?Blood pressure (!) 142/96, pulse 98, temperature 97.9 ?F (36.6 ?C), resp. rate 16, height 5\' 4"  (1.626 m), weight 59 kg, SpO2 98 %. ? ?PHYSICAL EXAMINATION:  ? ?GENERAL:  76 y.o.-year-old patient lying in the bed with no acute distress.  ?LUNGS: Normal breath sounds bilaterally ?CARDIOVASCULAR: S1, S2 normal. No murmurs, rubs, or gallops.  ?ABDOMEN: Soft, nontender, nondistended. Bowel sounds present.  ?EXTREMITIES: right LE decreased ROM ? ? ?NEUROLOGIC: nonfocal  patient is alert and awake ?SKIN:  ?  ? ? ?LABORATORY PANEL: ?  ?CBC ?Recent Labs  ?Lab 11/05/21 ?0716  ?WBC 5.4  ?HGB 8.8*  ?HCT 28.6*  ?PLT 153  ? ? ? ?Chemistries  ?Recent Labs  ?Lab 11/04/21 ?[id number]  ?NA 141  ?K 3.7  ?CL 110  ?CO2 24  ?GLUCOSE 85  ?BUN 55*  ?CREATININE 1.23*  ?CALCIUM 9.1  ? ? ?Cardiac Enzymes ?No results for input(s): TROPONINI in the last 168 hours. ?RADIOLOGY:  ?No results found. ? ?Assessment and Plan ? ?[name] [name] is an 76 y.o. female with autoimmune hepatitis, HTN, CKD who is s/p recent hip fracture who represented after SNF with a recurrent fall.  Patient states she has been at home has been walking with a cane but mostly has been sedentary.  Yesterday patient states she was unable to get up out of her couch due to weakness and her legs felt heavy ? ? Duplex Doppler showed bilateral DVT with extension up into the pelvis.  Patient was seen by vascular surgery who are planning on a thrombectomy . ?On IV heparin  gtt ? ? ?Bilateral DVT with extension above the hip into the pelvis ?--Patient  on heparin drip with pharmacy assistance ?--Patient seen by Dr. Lorenso Courier, vascular surgery  ?--s/p  Catheter directed thrombolysis with right popliteal and femoral vein to the 4 mg of tPA ?  Mechanical thrombectomy to the right popliteal, superficial femoral, and common femoral veins ?--per Dr Dew--ok to change to po anticoagulation ?  ?Acute on chronic kidney injury ?--Patient has been stuck on her couch for the past 1 to 2 days with decreased p.o. intake ?--Will hydrate overnight and recheck creatinine in the morning ?--creat 1.70--1.2 ?  ?HTN ?--Patient is normotensive despite not taking her amlodipine and metoprolol since the day 10/31/2021 ?-- BP remains soft--hold po meds ?  ?Autoimmune hepatitis ?--Continue prednisone and azathioprine ?  ?Debilitation s/p hip fracture ?--PT to see pt ?TOC for d/c planning ? ?Wound type: stage 4 sacral wound ?Pressure Injury POA: Yes ?Measurement: To be provided by the bedside RN in the flowsheet section  ?Wound bed: see photo ?Drainage (amount, consistency, odor)  ?Periwound: erythematous ?Dressing procedure/placement/frequency: ?Cleanse the sacral wound with NS. Pat dry then apply a nickel thick layer of MediHoney directly to the wound  and any tunneled or undermined area then cover with gauze and secure with a foam dressing. Change dressing daily.  ?  ?TOC for discharge planning to rehab. ? ?  Procedures: Right LE thrombectomy for extensive DVT ?Family communication :l none today ?Consults : vascular surgery ?CODE STATUS: DNR ?DVT Prophylaxis : eliquis ?Level of care: Med-Surg ?Status is: Inpatient ?Remains inpatient appropriate because: bilateral DVT,  s/p vascular procedure, physical therapy, TOC for discharge planning-- await bed availability and insurance authorization. ?  ? ?TOTAL TIME TAKING CARE OF THIS PATIENT: 30 minutes.  ?>50% time spent on counselling and coordination of care ? ?Note: This  dictation was prepared with Dragon dictation along with smaller phrase technology. Any transcriptional errors that result from this process are unintentional. ? ?Fritzi Mandes M.D  ? ? ?[facility]  ? ?CC: ?Primary care physician; [place]  ?

## 2021-11-06 NOTE — TOC Progression Note (Signed)
Transition of Care (TOC) - Progression Note  ? ? ?Patient Details  ?Name: [name] [name] ?MRN: [id number] ?Date of Birth: 1946/01/09 ? ?Transition of Care (TOC) CM/SW Contact  ?Elkin Belfield E Elster Corbello, LCSW ?Phone Number: ?11/06/2021, 2:45 PM ? ?Clinical Narrative:   Patient has a bed offer at [facility]. No response from [facility]. ?CSW attempted call to step son Mitzi Hansen to follow up if they want to go with [facility]. Left VM. ?Will start auth tomorrow in preparation for DC to SNF Monday if medically ready.  ? ? ? ?Expected Discharge Plan: [place] ?Barriers to Discharge: Continued Medical Work up ? ?Expected Discharge Plan and Services ?Expected Discharge Plan: [place] ?  ?Discharge Planning Services: CM Consult ?  ?Living arrangements for the past 2 months: [place] ?                ?DME Arranged: N/A ?  ?  ?  ?  ?  ?  ?  ?  ?  ? ? ?Social Determinants of Health (SDOH) Interventions ?  ? ?Readmission Risk Interventions ?   ? View : No data to display.  ?  ?  ?  ? ? ?

## 2021-11-07 DIAGNOSIS — I82413 Acute embolism and thrombosis of femoral vein, bilateral: Secondary | ICD-10-CM | POA: Diagnosis not present

## 2021-11-07 NOTE — TOC Progression Note (Addendum)
Transition of Care (TOC) - Progression Note  ? ? ?Patient Details  ?Name: Arlette S Burford ?MRN: [id number] ?Date of Birth: 1945-12-17 ? ?Transition of Care (TOC) CM/SW Contact  ?Lajuan Godbee E Haydin Calandra, LCSW ?Phone Number: ?11/07/2021, 10:01 AM ? ?Clinical Narrative:   Attempted call to step son Mitzi Hansen again. Left another VM. ?Patient does not have a bed offer at [facility]. Only bed offer is at [facility]. Per previous conversations with Mitzi Hansen, he wanted [facility] if [facility] could not offer.  ?CSW called [facility] and started insurance auth for [facility]. Faxed clinicals. ?Asked Tammy at [facility] to confirm if they would have a bed for patient on Monday if family agrees to pay since patient is in her copay days. Awaiting confirmation. ? ?12:27- Return call from [place]. He stated they do agree with patient going to [facility] if [facility] does not offer a bed by tomorrow.  ?He is aware of plan for DC tomorrow pending auth and bed availability.  ? ?1:38- Message from Tammy at [facility] who stated she wants to make sure family knows patient is in her copay days. Informed her they do know this.  ? ? ?Expected Discharge Plan: [place] ?Barriers to Discharge: Continued Medical Work up ? ?Expected Discharge Plan and Services ?Expected Discharge Plan: [place] ?  ?Discharge Planning Services: CM Consult ?  ?Living arrangements for the past 2 months: [place] ?                ?DME Arranged: N/A ?  ?  ?  ?  ?  ?  ?  ?  ?  ? ? ?Social Determinants of Health (SDOH) Interventions ?  ? ?Readmission Risk Interventions ?   ? View : No data to display.  ?  ?  ?  ? ? ?

## 2021-11-07 NOTE — Plan of Care (Signed)

## 2021-11-07 NOTE — Progress Notes (Signed)
Vitals entered manually ° °

## 2021-11-07 NOTE — Progress Notes (Signed)
[place] at [facility] ? ? ?PATIENT NAME: [name] [name]   ? ?MR#:  [id number] ? ?DATE OF BIRTH:  September 02, 1945 ? ?SUBJECTIVE:  ? ?no family at bedside. Patient came in with significant weakness and are right lower extremity swelling with pain unable to ambulate at home. Recently discharged two days ago from rehab after second right hip surgery. Found to have extensive right lower extremity DVT.  ? ?No new complaints today. Did work with PT yday ?No new issues per RN ? ?VITALS:  ?Blood pressure (!) 158/98, pulse 65, temperature 98.1 ?F (36.7 ?C), resp. rate 17, height 5\' 4"  (1.626 m), weight 59 kg, SpO2 92 %. ? ?PHYSICAL EXAMINATION:  ? ?GENERAL:  76 y.o.-year-old patient lying in the bed with no acute distress.  ?LUNGS: Normal breath sounds bilaterally ?CARDIOVASCULAR: S1, S2 normal. No murmurs, rubs, or gallops.  ?ABDOMEN: Soft, nontender, nondistended. Bowel sounds present.  ?EXTREMITIES: right LE decreased ROM ? ? ?NEUROLOGIC: nonfocal  patient is alert and awake ?SKIN:  ?  ? ? ?LABORATORY PANEL: ?  ?CBC ?Recent Labs  ?Lab 11/05/21 ?0716  ?WBC 5.4  ?HGB 8.8*  ?HCT 28.6*  ?PLT 153  ? ? ? ?Chemistries  ?Recent Labs  ?Lab 11/04/21 ?[id number]  ?NA 141  ?K 3.7  ?CL 110  ?CO2 24  ?GLUCOSE 85  ?BUN 55*  ?CREATININE 1.23*  ?CALCIUM 9.1  ? ? ?Cardiac Enzymes ?No results for input(s): TROPONINI in the last 168 hours. ?RADIOLOGY:  ?No results found. ? ?Assessment and Plan ? ?[name] [name] is an 76 y.o. female with autoimmune hepatitis, HTN, CKD who is s/p recent hip fracture who represented after SNF with a recurrent fall.  Patient states she has been at home has been walking with a cane but mostly has been sedentary.  Yesterday patient states she was unable to get up out of her couch due to weakness and her legs felt heavy ? ? Duplex Doppler showed bilateral DVT with extension up into the pelvis.  Patient was seen by vascular surgery who are planning on a thrombectomy . ?On IV heparin  gtt ? ? ?Bilateral DVT with extension above the hip into the pelvis ?--Patient  on heparin drip with pharmacy assistance ?--Patient seen by Dr. Lorenso Courier, vascular surgery  ?--s/p  Catheter directed thrombolysis with right popliteal and femoral vein to the 4 mg of tPA ?  Mechanical thrombectomy to the right popliteal, superficial femoral, and common femoral veins ?--per Dr Dew--ok to change to po anticoagulation ?  ?Acute on chronic kidney injury ?--Patient has been stuck on her couch for the past 1 to 2 days with decreased p.o. intake ?--Will hydrate overnight and recheck creatinine in the morning ?--creat 1.70--1.2 ?  ?HTN ?--Patient is normotensive despite not taking her amlodipine and metoprolol since the day 10/31/2021 ?-- BP remains soft--hold po meds ?  ?Autoimmune hepatitis ?--Continue prednisone and azathioprine ?  ?Debilitation s/p hip fracture ?--PT to see pt ?TOC for d/c planning--pt has bed at [facility]--waiting for son to confirm it ? ?Wound type: stage 4 sacral wound ?Pressure Injury POA: Yes ?Measurement: To be provided by the bedside RN in the flowsheet section  ?Drainage (amount, consistency, odor)  ?Periwound: erythematous ?Dressing procedure/placement/frequency: ?Cleanse the sacral wound with NS. Pat dry then apply a nickel thick layer of MediHoney directly to the wound  and any tunneled or undermined area then cover with gauze and secure with a foam dressing. Change dressing daily.  ?  ?TOC for  discharge planning to rehab. ? ?Procedures: Right LE thrombectomy for extensive DVT ?Family communication :l none today ?Consults : vascular surgery ?CODE STATUS: DNR ?DVT Prophylaxis : eliquis ?Level of care: Med-Surg ?Status is: Inpatient ?Remains inpatient appropriate because:  ?patient medically best at baseline for discharge. Await family to make final decision on rehab bed that has been offered to [facility]. ?  ? ?TOTAL TIME TAKING CARE OF THIS PATIENT: 25 minutes.  ?>50% time spent on counselling and coordination  of care ? ?Note: This dictation was prepared with Dragon dictation along with smaller phrase technology. Any transcriptional errors that result from this process are unintentional. ? ?Fritzi Mandes M.D  ? ? ?[facility]  ? ?CC: ?Primary care physician; [place]  ?

## 2021-11-08 DIAGNOSIS — I82403 Acute embolism and thrombosis of unspecified deep veins of lower extremity, bilateral: Secondary | ICD-10-CM | POA: Diagnosis not present

## 2021-11-08 DIAGNOSIS — F0393 Unspecified dementia, unspecified severity, with mood disturbance: Secondary | ICD-10-CM | POA: Diagnosis not present

## 2021-11-08 DIAGNOSIS — G301 Alzheimer's disease with late onset: Secondary | ICD-10-CM | POA: Diagnosis not present

## 2021-11-08 DIAGNOSIS — I82413 Acute embolism and thrombosis of femoral vein, bilateral: Secondary | ICD-10-CM | POA: Diagnosis not present

## 2021-11-08 DIAGNOSIS — F02B4 Dementia in other diseases classified elsewhere, moderate, with anxiety: Secondary | ICD-10-CM | POA: Diagnosis not present

## 2021-11-08 DIAGNOSIS — I10 Essential (primary) hypertension: Secondary | ICD-10-CM | POA: Diagnosis not present

## 2021-11-08 DIAGNOSIS — B379 Candidiasis, unspecified: Secondary | ICD-10-CM | POA: Diagnosis not present

## 2021-11-08 DIAGNOSIS — N390 Urinary tract infection, site not specified: Secondary | ICD-10-CM | POA: Diagnosis not present

## 2021-11-08 DIAGNOSIS — Z79899 Other long term (current) drug therapy: Secondary | ICD-10-CM | POA: Diagnosis not present

## 2021-11-08 DIAGNOSIS — F411 Generalized anxiety disorder: Secondary | ICD-10-CM | POA: Diagnosis not present

## 2021-11-08 DIAGNOSIS — M6281 Muscle weakness (generalized): Secondary | ICD-10-CM | POA: Diagnosis not present

## 2021-11-08 DIAGNOSIS — R4182 Altered mental status, unspecified: Secondary | ICD-10-CM | POA: Diagnosis not present

## 2021-11-08 DIAGNOSIS — Z7401 Bed confinement status: Secondary | ICD-10-CM | POA: Diagnosis not present

## 2021-11-08 DIAGNOSIS — L89159 Pressure ulcer of sacral region, unspecified stage: Secondary | ICD-10-CM | POA: Diagnosis not present

## 2021-11-08 DIAGNOSIS — I959 Hypotension, unspecified: Secondary | ICD-10-CM | POA: Diagnosis not present

## 2021-11-08 DIAGNOSIS — R0902 Hypoxemia: Secondary | ICD-10-CM | POA: Diagnosis not present

## 2021-11-08 DIAGNOSIS — I82503 Chronic embolism and thrombosis of unspecified deep veins of lower extremity, bilateral: Secondary | ICD-10-CM | POA: Diagnosis not present

## 2021-11-08 DIAGNOSIS — I2510 Atherosclerotic heart disease of native coronary artery without angina pectoris: Secondary | ICD-10-CM | POA: Diagnosis not present

## 2021-11-08 DIAGNOSIS — F419 Anxiety disorder, unspecified: Secondary | ICD-10-CM | POA: Diagnosis not present

## 2021-11-08 DIAGNOSIS — R634 Abnormal weight loss: Secondary | ICD-10-CM | POA: Diagnosis not present

## 2021-11-08 DIAGNOSIS — Z86718 Personal history of other venous thrombosis and embolism: Secondary | ICD-10-CM | POA: Diagnosis not present

## 2021-11-08 DIAGNOSIS — E869 Volume depletion, unspecified: Secondary | ICD-10-CM | POA: Diagnosis not present

## 2021-11-08 DIAGNOSIS — R110 Nausea: Secondary | ICD-10-CM | POA: Diagnosis not present

## 2021-11-08 DIAGNOSIS — L89154 Pressure ulcer of sacral region, stage 4: Secondary | ICD-10-CM | POA: Diagnosis not present

## 2021-11-08 DIAGNOSIS — F4321 Adjustment disorder with depressed mood: Secondary | ICD-10-CM | POA: Diagnosis not present

## 2021-11-08 DIAGNOSIS — G8911 Acute pain due to trauma: Secondary | ICD-10-CM | POA: Diagnosis not present

## 2021-11-08 DIAGNOSIS — D649 Anemia, unspecified: Secondary | ICD-10-CM | POA: Diagnosis not present

## 2021-11-08 DIAGNOSIS — M80051D Age-related osteoporosis with current pathological fracture, right femur, subsequent encounter for fracture with routine healing: Secondary | ICD-10-CM | POA: Diagnosis not present

## 2021-11-08 DIAGNOSIS — E559 Vitamin D deficiency, unspecified: Secondary | ICD-10-CM | POA: Diagnosis not present

## 2021-11-08 LAB — CBC
HCT: 32.7 % — ABNORMAL LOW (ref 36.0–46.0)
Hemoglobin: 10.2 g/dL — ABNORMAL LOW (ref 12.0–15.0)
MCH: 27.1 pg (ref 26.0–34.0)
MCHC: 31.2 g/dL (ref 30.0–36.0)
MCV: 87 fL (ref 80.0–100.0)
Platelets: 206 10*3/uL (ref 150–400)
RBC: 3.76 MIL/uL — ABNORMAL LOW (ref 3.87–5.11)
RDW: 15.8 % — ABNORMAL HIGH (ref 11.5–15.5)
WBC: 8.2 10*3/uL (ref 4.0–10.5)
nRBC: 0 % (ref 0.0–0.2)

## 2021-11-08 MED ORDER — HYDROCODONE-ACETAMINOPHEN 5-325 MG PO TABS: 1.0000 | ORAL_TABLET | Freq: Every day | ORAL | 0 refills | Status: DC | PRN

## 2021-11-08 MED ORDER — MEDIHONEY WOUND/BURN DRESSING EX PSTE: 1.0000 "application " | PASTE | Freq: Every day | CUTANEOUS | 0 refills | Status: DC

## 2021-11-08 MED ORDER — APIXABAN 5 MG PO TABS: 10.0000 mg | ORAL_TABLET | Freq: Two times a day (BID) | ORAL | 3 refills | Status: DC

## 2021-11-08 NOTE — Care Management Important Message (Signed)
Important Message ? ?Patient Details  ?Name: [name] [name] ?MRN: [id number] ?Date of Birth: [birth year] ? ? ?Medicare Important Message Given:  Yes ? ? ? ? ?Olegario Messier A Belen Zwahlen ?11/08/2021, 10:13 AM ?

## 2021-11-08 NOTE — Progress Notes (Signed)
EMS here to transport pt. 

## 2021-11-08 NOTE — Progress Notes (Signed)
EMS called for transport.

## 2021-11-08 NOTE — Consult Note (Signed)
[facility] CM Inpatient Consult ? ? ?11/08/2021 ? ?[name] [name] ?1946/03/23 ?[id number] ? ?[facility] Customer service manager Care Management [facility] CM) ?  ?Patient chart has been reviewed with noted high risk score for unplanned readmissions.  Patient assessed for community [facility] Care Management follow up needs. Per review, current disposition is for SNF. No [facility] CM needs. ?  ?Of note, [facility] Care Management services does not replace or interfere with any services that are arranged by inpatient case management or social work.  ?  ?Christophe Louis, MSN, RN ?[facility] [employer] Liaison ?Phone [phone] ?Toll free office [phone]  ? ? ? ?

## 2021-11-08 NOTE — Discharge Summary (Signed)
?Physician Discharge Summary ?  ?Patient: [name] [name] MRN: [id number] DOB: 1946-04-16  ?Admit date:     11/03/2021  ?Discharge date: 11/08/21  ?Discharge Physician: Fritzi Mandes  ? ?PCP: Andrew  ? ?Recommendations at discharge:  ? ?patient to follow-up with Dr. Lucky Cowboy in 2 to 3 weeks ?follow-up PCP in 1-2 weeks ?keep up with you follow-up appointment with Dr. Candiss Norse nephrology and [facility] gastroenterology ? ?Discharge Diagnoses: ? ?Right lower extremity extensive DVT status postmechanical thrombectomy ? ?Hospital Course: ? ?[name] [name] is an 76 y.o. female with autoimmune hepatitis, HTN, CKD who is s/p recent hip fracture who represented after SNF with a recurrent fall.  Patient states she has been at home has been walking with a cane but mostly has been sedentary.  Yesterday patient states she was unable to get up out of her couch due to weakness and her legs felt heavy ?  ? Duplex Doppler showed bilateral DVT with extension up into the pelvis.  Patient was seen by vascular surgery who are planning on a thrombectomy . ?On IV heparin gtt ?  ?  ?Bilateral DVT with extension above the hip into the pelvis ?--Patient  on heparin drip with pharmacy assistance ?--Patient seen by Dr. Lorenso Courier, vascular surgery  ?--s/p  Catheter directed thrombolysis with right popliteal and femoral vein to the 4 mg of tPA ?  Mechanical thrombectomy to the right popliteal, superficial femoral, and common femoral veins ?--per Dr Dew--ok to change to po anticoagulation--f/u in 2-3 weeks ?  ?Acute on chronic kidney injury ?--Will hydrate overnight and recheck creatinine in the morning ?--creat 1.70--1.2 ?--creat at baseline 1.23 ?  ?HTN ?--Patient is normotensive despite not taking her amlodipine and metoprolol since the day 10/31/2021 ?-- BP remains soft--hold po meds ?--4/24--BP now trending up--resume amlodipine and BB ?  ?Autoimmune hepatitis ?--Continue prednisone and azathioprine ?  ?Debilitation s/p hip fracture ?--PT   ?TOC for d/c planning to [facility] ?  ?Wound type: stage 4 sacral wound ?Pressure Injury POA: Yes ?Measurement: To be provided by the bedside RN in the flowsheet section  ?Wound bed: see photo ?Drainage (amount, consistency, odor)  ?Periwound: erythematous ?Dressing procedure/placement/frequency: ?Cleanse the sacral wound with NS. Pat dry then apply a nickel thick layer of MediHoney directly to the wound  and any tunneled or undermined area then cover with gauze and secure with a foam dressing. Change dressing daily.  ?  ?TOC for discharge planning to rehab. ?  ?Procedures: Right LE thrombectomy for extensive DVT ?Family communication :left msg for son Mitzi Hansen on 4/23 ?Consults : vascular surgery ?CODE STATUS: DNR ?DVT Prophylaxis : eliquis ? ?  ? ? ?Consultants: vascular surgery ?Procedures performed: right DVT mechanical thrmobectomy ?Disposition: Skilled nursing facility ?Diet recommendation:  ?Discharge Diet Orders (From admission, onward)  ? ?  Start     Ordered  ? 11/08/21 0000  Diet - low sodium heart healthy       ? 11/08/21 0816  ? ?  ?  ? ?  ? ?Cardiac diet ?DISCHARGE MEDICATION: ?Allergies as of 11/08/2021   ? ?   Reactions  ? Sulfa Antibiotics Nausea And Vomiting  ? Pt has not had a sulfa drug in years and is not certain of reaction but suspects nausea and vomiting.  ? Sulfasalazine Nausea And Vomiting  ? ?  ? ?  ?Medication List  ?  ? ?STOP taking these medications   ? ?aspirin 81 MG chewable tablet ?  ?diphenhydramine-acetaminophen 25-500 MG Tabs  tablet ?Commonly known as: TYLENOL PM ?  ?potassium chloride 10 MEQ tablet ?Commonly known as: KLOR-CON ?  ?sulfamethoxazole-trimethoprim 800-160 MG tablet ?Commonly known as: BACTRIM DS ?  ?Walker Misc ?  ? ?  ? ?TAKE these medications   ? ?acetaminophen 325 MG tablet ?Commonly known as: TYLENOL ?Take 2 tablets (650 mg total) by mouth every 6 (six) hours as needed for mild pain or moderate pain (pain score 1-3 or temp > 100.5). ?  ?amLODipine 5 MG tablet ?Commonly  known as: NORVASC ?Take 5 mg by mouth daily. ?  ?apixaban 5 MG Tabs tablet ?Commonly known as: ELIQUIS ?Take 2 tablets (10 mg total) by mouth 2 (two) times daily. From 11/12/2021 take 5 mg twice a day ?  ?azaTHIOprine 50 MG tablet ?Commonly known as: IMURAN ?Take 25 mg by mouth daily. ?  ?Calcium Carbonate Antacid 600 MG chewable tablet ?Chew 600 mg by mouth. ?  ?calcium citrate-vitamin D 315-200 MG-UNIT tablet ?Commonly known as: CITRACAL+D ?Take 1 tablet by mouth 2 (two) times daily. ?  ?cyanocobalamin 100 MCG tablet ?Take 100 mcg by mouth daily. ?  ?estradiol 0.1 MG/GM vaginal cream ?Commonly known as: ESTRACE ?Place 1 Applicatorful vaginally 3 (three) times a week. ?  ?feeding supplement Liqd ?Take 237 mLs by mouth 2 (two) times daily between meals. ?  ?Ferrous Fumarate 324 (106 Fe) MG Tabs tablet ?Commonly known as: HEMOCYTE - 106 mg FE ?Take 1 tablet by mouth daily. ?  ?HYDROcodone-acetaminophen 5-325 MG tablet ?Commonly known as: NORCO/VICODIN ?Take 1 tablet by mouth daily as needed. ?  ?leptospermum manuka honey Pste paste ?Apply 1 application. topically daily. ?  ?metoprolol tartrate 50 MG tablet ?Commonly known as: LOPRESSOR ?Take 50 mg by mouth daily. ?  ?multivitamin with minerals Tabs tablet ?Take 1 tablet by mouth daily. ?  ?omeprazole 40 MG capsule ?Commonly known as: PRILOSEC ?Take 40 mg by mouth daily. ?  ?polyethylene glycol 17 g packet ?Commonly known as: MIRALAX / GLYCOLAX ?Take 17 g by mouth daily as needed for mild constipation. ?  ?potassium chloride 10 MEQ tablet ?Commonly known as: KLOR-CON M ?Take 10 mEq by mouth daily. ?  ?predniSONE 5 MG tablet ?Commonly known as: DELTASONE ?Take 7.5 mg by mouth daily. ?  ? ?  ? ?  ?  ? ? ?  ?Discharge Care Instructions  ?(From admission, onward)  ?  ? ? ?  ? ?  Start     Ordered  ? 11/08/21 0000  Discharge wound care:       ?Comments: Pressure Injury 09/19/21 Sacrum Mid Stage 2 -  Partial thickness loss of dermis presenting as a shallow open injury with a  red, pink wound bed without slough.  ?Dressing procedure/placement/frequency: ?Cleanse the sacral wound with NS. Pat dry then apply a nickel thick layer of MediHoney directly to the wound  and any tunneled or undermined area then cover with gauze and secure with a foam dressing. Change dressing daily.  ? 11/08/21 0816  ? ?  ?  ? ?  ? ? Follow-up Information   ? ? Algernon Huxley, MD. Schedule an appointment as soon as possible for a visit in 3 week(s).   ?Specialties: Vascular Surgery, Radiology, Interventional Cardiology ?Why: right LE DVT s/p thrombectomy ?Contact information: ?[address] ?[place] [address] ?[phone] ? ? ?  ?  ? ?  ?  ? ?  ? ?Discharge Exam: ?Filed Weights  ? 11/03/21 1309  ?Weight: 59 kg  ? ? ? ?  Condition at discharge: fair ? ?The results of significant diagnostics from this hospitalization (including imaging, microbiology, ancillary and laboratory) are listed below for reference.  ? ?Imaging Studies: ?PERIPHERAL VASCULAR CATHETERIZATION ? ?Result Date: 11/04/2021 ?See surgical note for result. ? ?US Venous Img Lower Unilateral Left ? ?Result Date: 11/03/2021 ?CLINICAL DATA:  75 year old female with left lower leg pain and swelling for 3 weeks EXAM: LEFT LOWER EXTREMITY VENOUS DOPPLER ULTRASOUND TECHNIQUE: Gray-scale sonography with graded compression, as well as color Doppler and duplex ultrasound were performed to evaluate the lower extremity deep venous systems from the level of the common femoral vein and including the common femoral, femoral, profunda femoral, popliteal and calf veins including the posterior tibial, peroneal and gastrocnemius veins when visible. The superficial great saphenous vein was also interrogated. Spectral Doppler was utilized to evaluate flow at rest and with distal augmentation maneuvers in the common femoral, femoral and popliteal veins. COMPARISON:  None. FINDINGS: Contralateral Common Femoral Vein: Noncompressible common femoral vein with minimal flow  maintained. Thrombus extends into the pelvis. Common Femoral Vein: No evidence of thrombus. Normal compressibility, respiratory phasicity and response to augmentation. Saphenofemoral Junction: No evidence

## 2021-11-08 NOTE — Progress Notes (Cosign Needed)
Sacral dressings changed.  ?H.Olanda Downie, [facility] PNS ?

## 2021-11-08 NOTE — Progress Notes (Signed)
Attempted to call report 3 times to [facility], left message for nurse to return call. Pt waiting on EMS transportation ?

## 2021-11-08 NOTE — Plan of Care (Signed)

## 2021-11-08 NOTE — TOC Progression Note (Signed)
Transition of Care (TOC) - Progression Note  ? ? ?Patient Details  ?Name: [name] [name] ?MRN: [id number] ?Date of Birth: 1945-10-17 ? ?Transition of Care (TOC) CM/SW Contact  ?Marlowe Sax, RN ?Phone Number: ?11/08/2021, 10:19 AM ? ?Clinical Narrative:    ? ?Checked with [facility], Ins still pending, to go to [facility] today once Ins approved ? ?Expected Discharge Plan: Home w Home Health Services ?Barriers to Discharge: Continued Medical Work up ? ?Expected Discharge Plan and Services ?Expected Discharge Plan: Home w Home Health Services ?  ?Discharge Planning Services: CM Consult ?  ?Living arrangements for the past 2 months: Single Family Home ?Expected Discharge Date: 11/08/21               ?DME Arranged: N/A ?  ?  ?  ?  ?  ?  ?  ?  ?  ? ? ?Social Determinants of Health (SDOH) Interventions ?  ? ?Readmission Risk Interventions ?   ? View : No data to display.  ?  ?  ?  ? ? ?

## 2021-11-08 NOTE — TOC Progression Note (Signed)
Transition of Care (TOC) - Progression Note  ? ? ?Patient Details  ?Name: [name] [name] ?MRN: [id number] ?Date of Birth: 1945-12-08 ? ?Transition of Care (TOC) CM/SW Contact  ?Marlowe Sax, RN ?Phone Number: ?11/08/2021, 1:48 PM ? ?Clinical Narrative:    ? ?Checked [employer], Ins still pending ? ?Expected Discharge Plan: Home w Home Health Services ?Barriers to Discharge: Continued Medical Work up ? ?Expected Discharge Plan and Services ?Expected Discharge Plan: Home w Home Health Services ?  ?Discharge Planning Services: CM Consult ?  ?Living arrangements for the past 2 months: Single Family Home ?Expected Discharge Date: 11/08/21               ?DME Arranged: N/A ?  ?  ?  ?  ?  ?  ?  ?  ?  ? ? ?Social Determinants of Health (SDOH) Interventions ?  ? ?Readmission Risk Interventions ?   ? View : No data to display.  ?  ?  ?  ? ? ?

## 2021-11-08 NOTE — TOC Progression Note (Signed)
Transition of Care (TOC) - Progression Note  ? ? ?Patient Details  ?Name: [name] [name] ?MRN: [id number] ?Date of Birth: Jan 27, 1946 ? ?Transition of Care (TOC) CM/SW Contact  ?Marlowe Sax, RN ?Phone Number: ?11/08/2021, 2:05 PM ? ?Clinical Narrative:    ? ?In approved to go to [facility] [id number] ?Notified Tammy at [facility], EMS to transport to [facility] ? ?Expected Discharge Plan: Home w Home Health Services ?Barriers to Discharge: Continued Medical Work up ? ?Expected Discharge Plan and Services ?Expected Discharge Plan: Home w Home Health Services ?  ?Discharge Planning Services: CM Consult ?  ?Living arrangements for the past 2 months: Single Family Home ?Expected Discharge Date: 11/08/21               ?DME Arranged: N/A ?  ?  ?  ?  ?  ?  ?  ?  ?  ? ? ?Social Determinants of Health (SDOH) Interventions ?  ? ?Readmission Risk Interventions ?   ? View : No data to display.  ?  ?  ?  ? ? ?

## 2021-11-08 NOTE — Plan of Care (Signed)

## 2021-11-09 DIAGNOSIS — G8911 Acute pain due to trauma: Secondary | ICD-10-CM | POA: Diagnosis not present

## 2021-11-09 DIAGNOSIS — M6281 Muscle weakness (generalized): Secondary | ICD-10-CM | POA: Diagnosis not present

## 2021-11-09 DIAGNOSIS — I82403 Acute embolism and thrombosis of unspecified deep veins of lower extremity, bilateral: Secondary | ICD-10-CM | POA: Diagnosis not present

## 2021-11-09 DIAGNOSIS — M80051D Age-related osteoporosis with current pathological fracture, right femur, subsequent encounter for fracture with routine healing: Secondary | ICD-10-CM | POA: Diagnosis not present

## 2021-11-12 DIAGNOSIS — I10 Essential (primary) hypertension: Secondary | ICD-10-CM | POA: Diagnosis not present

## 2021-11-12 DIAGNOSIS — G8911 Acute pain due to trauma: Secondary | ICD-10-CM | POA: Diagnosis not present

## 2021-11-12 DIAGNOSIS — Z86718 Personal history of other venous thrombosis and embolism: Secondary | ICD-10-CM | POA: Diagnosis not present

## 2021-11-12 DIAGNOSIS — L89159 Pressure ulcer of sacral region, unspecified stage: Secondary | ICD-10-CM | POA: Diagnosis not present

## 2021-11-16 DIAGNOSIS — G8911 Acute pain due to trauma: Secondary | ICD-10-CM | POA: Diagnosis not present

## 2021-11-16 DIAGNOSIS — L89159 Pressure ulcer of sacral region, unspecified stage: Secondary | ICD-10-CM | POA: Diagnosis not present

## 2021-11-16 DIAGNOSIS — F419 Anxiety disorder, unspecified: Secondary | ICD-10-CM | POA: Diagnosis not present

## 2021-11-17 DIAGNOSIS — L89154 Pressure ulcer of sacral region, stage 4: Secondary | ICD-10-CM | POA: Diagnosis not present

## 2021-11-17 DIAGNOSIS — L89159 Pressure ulcer of sacral region, unspecified stage: Secondary | ICD-10-CM | POA: Diagnosis not present

## 2021-11-17 DIAGNOSIS — R4182 Altered mental status, unspecified: Secondary | ICD-10-CM | POA: Diagnosis not present

## 2021-11-17 DIAGNOSIS — F419 Anxiety disorder, unspecified: Secondary | ICD-10-CM | POA: Diagnosis not present

## 2021-11-18 DIAGNOSIS — E869 Volume depletion, unspecified: Secondary | ICD-10-CM | POA: Diagnosis not present

## 2021-11-18 DIAGNOSIS — R4182 Altered mental status, unspecified: Secondary | ICD-10-CM | POA: Diagnosis not present

## 2021-11-18 DIAGNOSIS — I959 Hypotension, unspecified: Secondary | ICD-10-CM | POA: Diagnosis not present

## 2021-11-18 DIAGNOSIS — N390 Urinary tract infection, site not specified: Secondary | ICD-10-CM | POA: Diagnosis not present

## 2021-11-19 DIAGNOSIS — E869 Volume depletion, unspecified: Secondary | ICD-10-CM | POA: Diagnosis not present

## 2021-11-19 DIAGNOSIS — I959 Hypotension, unspecified: Secondary | ICD-10-CM | POA: Diagnosis not present

## 2021-11-19 DIAGNOSIS — N390 Urinary tract infection, site not specified: Secondary | ICD-10-CM | POA: Diagnosis not present

## 2021-11-19 DIAGNOSIS — R4182 Altered mental status, unspecified: Secondary | ICD-10-CM | POA: Diagnosis not present

## 2021-11-22 DIAGNOSIS — I959 Hypotension, unspecified: Secondary | ICD-10-CM | POA: Diagnosis not present

## 2021-11-22 DIAGNOSIS — R634 Abnormal weight loss: Secondary | ICD-10-CM | POA: Diagnosis not present

## 2021-11-22 DIAGNOSIS — R4182 Altered mental status, unspecified: Secondary | ICD-10-CM | POA: Diagnosis not present

## 2021-11-22 DIAGNOSIS — N390 Urinary tract infection, site not specified: Secondary | ICD-10-CM | POA: Diagnosis not present

## 2021-11-24 DIAGNOSIS — L89154 Pressure ulcer of sacral region, stage 4: Secondary | ICD-10-CM | POA: Diagnosis not present

## 2021-11-24 DIAGNOSIS — M6281 Muscle weakness (generalized): Secondary | ICD-10-CM | POA: Diagnosis not present

## 2021-11-24 DIAGNOSIS — R4182 Altered mental status, unspecified: Secondary | ICD-10-CM | POA: Diagnosis not present

## 2021-11-24 DIAGNOSIS — L89159 Pressure ulcer of sacral region, unspecified stage: Secondary | ICD-10-CM | POA: Diagnosis not present

## 2021-11-24 DIAGNOSIS — I10 Essential (primary) hypertension: Secondary | ICD-10-CM | POA: Diagnosis not present

## 2021-11-26 DIAGNOSIS — R634 Abnormal weight loss: Secondary | ICD-10-CM | POA: Diagnosis not present

## 2021-11-26 DIAGNOSIS — L89159 Pressure ulcer of sacral region, unspecified stage: Secondary | ICD-10-CM | POA: Diagnosis not present

## 2021-11-26 DIAGNOSIS — N390 Urinary tract infection, site not specified: Secondary | ICD-10-CM | POA: Diagnosis not present

## 2021-11-26 DIAGNOSIS — R4182 Altered mental status, unspecified: Secondary | ICD-10-CM | POA: Diagnosis not present

## 2021-11-30 DIAGNOSIS — R4182 Altered mental status, unspecified: Secondary | ICD-10-CM | POA: Diagnosis not present

## 2021-11-30 DIAGNOSIS — R634 Abnormal weight loss: Secondary | ICD-10-CM | POA: Diagnosis not present

## 2021-12-01 DIAGNOSIS — L89154 Pressure ulcer of sacral region, stage 4: Secondary | ICD-10-CM | POA: Diagnosis not present

## 2021-12-08 DIAGNOSIS — L89154 Pressure ulcer of sacral region, stage 4: Secondary | ICD-10-CM | POA: Diagnosis not present

## 2021-12-14 DIAGNOSIS — Z86718 Personal history of other venous thrombosis and embolism: Secondary | ICD-10-CM | POA: Diagnosis not present

## 2021-12-14 DIAGNOSIS — M6281 Muscle weakness (generalized): Secondary | ICD-10-CM | POA: Diagnosis not present

## 2021-12-14 DIAGNOSIS — I10 Essential (primary) hypertension: Secondary | ICD-10-CM | POA: Diagnosis not present

## 2021-12-14 DIAGNOSIS — R634 Abnormal weight loss: Secondary | ICD-10-CM | POA: Diagnosis not present

## 2021-12-15 DIAGNOSIS — L89154 Pressure ulcer of sacral region, stage 4: Secondary | ICD-10-CM | POA: Diagnosis not present

## 2021-12-21 DIAGNOSIS — B379 Candidiasis, unspecified: Secondary | ICD-10-CM | POA: Diagnosis not present

## 2021-12-22 DIAGNOSIS — L89154 Pressure ulcer of sacral region, stage 4: Secondary | ICD-10-CM | POA: Diagnosis not present

## 2021-12-29 DIAGNOSIS — F411 Generalized anxiety disorder: Secondary | ICD-10-CM | POA: Diagnosis not present

## 2021-12-29 DIAGNOSIS — R1311 Dysphagia, oral phase: Secondary | ICD-10-CM | POA: Diagnosis not present

## 2021-12-29 DIAGNOSIS — F02B4 Dementia in other diseases classified elsewhere, moderate, with anxiety: Secondary | ICD-10-CM | POA: Diagnosis not present

## 2021-12-29 DIAGNOSIS — G301 Alzheimer's disease with late onset: Secondary | ICD-10-CM | POA: Diagnosis not present

## 2021-12-29 DIAGNOSIS — M6259 Muscle wasting and atrophy, not elsewhere classified, multiple sites: Secondary | ICD-10-CM | POA: Diagnosis not present

## 2021-12-29 DIAGNOSIS — F4321 Adjustment disorder with depressed mood: Secondary | ICD-10-CM | POA: Diagnosis not present

## 2021-12-29 DIAGNOSIS — L89154 Pressure ulcer of sacral region, stage 4: Secondary | ICD-10-CM | POA: Diagnosis not present

## 2021-12-29 DIAGNOSIS — R41841 Cognitive communication deficit: Secondary | ICD-10-CM | POA: Diagnosis not present

## 2021-12-30 DIAGNOSIS — M6259 Muscle wasting and atrophy, not elsewhere classified, multiple sites: Secondary | ICD-10-CM | POA: Diagnosis not present

## 2021-12-30 DIAGNOSIS — R41841 Cognitive communication deficit: Secondary | ICD-10-CM | POA: Diagnosis not present

## 2021-12-30 DIAGNOSIS — R1311 Dysphagia, oral phase: Secondary | ICD-10-CM | POA: Diagnosis not present

## 2021-12-31 DIAGNOSIS — M6259 Muscle wasting and atrophy, not elsewhere classified, multiple sites: Secondary | ICD-10-CM | POA: Diagnosis not present

## 2021-12-31 DIAGNOSIS — R41841 Cognitive communication deficit: Secondary | ICD-10-CM | POA: Diagnosis not present

## 2021-12-31 DIAGNOSIS — R1311 Dysphagia, oral phase: Secondary | ICD-10-CM | POA: Diagnosis not present

## 2022-01-03 DIAGNOSIS — R1311 Dysphagia, oral phase: Secondary | ICD-10-CM | POA: Diagnosis not present

## 2022-01-03 DIAGNOSIS — M6259 Muscle wasting and atrophy, not elsewhere classified, multiple sites: Secondary | ICD-10-CM | POA: Diagnosis not present

## 2022-01-03 DIAGNOSIS — R41841 Cognitive communication deficit: Secondary | ICD-10-CM | POA: Diagnosis not present

## 2022-01-04 DIAGNOSIS — R41841 Cognitive communication deficit: Secondary | ICD-10-CM | POA: Diagnosis not present

## 2022-01-04 DIAGNOSIS — M6259 Muscle wasting and atrophy, not elsewhere classified, multiple sites: Secondary | ICD-10-CM | POA: Diagnosis not present

## 2022-01-04 DIAGNOSIS — R1311 Dysphagia, oral phase: Secondary | ICD-10-CM | POA: Diagnosis not present

## 2022-01-05 DIAGNOSIS — M6259 Muscle wasting and atrophy, not elsewhere classified, multiple sites: Secondary | ICD-10-CM | POA: Diagnosis not present

## 2022-01-05 DIAGNOSIS — R41841 Cognitive communication deficit: Secondary | ICD-10-CM | POA: Diagnosis not present

## 2022-01-05 DIAGNOSIS — I10 Essential (primary) hypertension: Secondary | ICD-10-CM | POA: Diagnosis not present

## 2022-01-05 DIAGNOSIS — L89159 Pressure ulcer of sacral region, unspecified stage: Secondary | ICD-10-CM | POA: Diagnosis not present

## 2022-01-05 DIAGNOSIS — R1311 Dysphagia, oral phase: Secondary | ICD-10-CM | POA: Diagnosis not present

## 2022-01-06 DIAGNOSIS — R41841 Cognitive communication deficit: Secondary | ICD-10-CM | POA: Diagnosis not present

## 2022-01-06 DIAGNOSIS — R1311 Dysphagia, oral phase: Secondary | ICD-10-CM | POA: Diagnosis not present

## 2022-01-06 DIAGNOSIS — M6259 Muscle wasting and atrophy, not elsewhere classified, multiple sites: Secondary | ICD-10-CM | POA: Diagnosis not present

## 2022-01-11 DIAGNOSIS — M80051D Age-related osteoporosis with current pathological fracture, right femur, subsequent encounter for fracture with routine healing: Secondary | ICD-10-CM | POA: Diagnosis not present

## 2022-01-11 DIAGNOSIS — D509 Iron deficiency anemia, unspecified: Secondary | ICD-10-CM | POA: Diagnosis not present

## 2022-01-11 DIAGNOSIS — Z86718 Personal history of other venous thrombosis and embolism: Secondary | ICD-10-CM | POA: Diagnosis not present

## 2022-01-11 DIAGNOSIS — M6281 Muscle weakness (generalized): Secondary | ICD-10-CM | POA: Diagnosis not present

## 2022-01-12 DIAGNOSIS — L89154 Pressure ulcer of sacral region, stage 4: Secondary | ICD-10-CM | POA: Diagnosis not present

## 2022-01-17 DIAGNOSIS — L89154 Pressure ulcer of sacral region, stage 4: Secondary | ICD-10-CM | POA: Diagnosis not present

## 2022-01-19 DIAGNOSIS — L89154 Pressure ulcer of sacral region, stage 4: Secondary | ICD-10-CM | POA: Diagnosis not present

## 2022-01-24 DIAGNOSIS — L89154 Pressure ulcer of sacral region, stage 4: Secondary | ICD-10-CM | POA: Diagnosis not present

## 2022-01-27 DIAGNOSIS — J90 Pleural effusion, not elsewhere classified: Secondary | ICD-10-CM | POA: Diagnosis not present

## 2022-01-27 DIAGNOSIS — R918 Other nonspecific abnormal finding of lung field: Secondary | ICD-10-CM | POA: Diagnosis not present

## 2022-01-27 DIAGNOSIS — J189 Pneumonia, unspecified organism: Secondary | ICD-10-CM | POA: Diagnosis not present

## 2022-01-27 DIAGNOSIS — L89154 Pressure ulcer of sacral region, stage 4: Secondary | ICD-10-CM | POA: Diagnosis not present

## 2022-01-27 DIAGNOSIS — L89159 Pressure ulcer of sacral region, unspecified stage: Secondary | ICD-10-CM | POA: Diagnosis not present

## 2022-02-03 DIAGNOSIS — L89154 Pressure ulcer of sacral region, stage 4: Secondary | ICD-10-CM | POA: Diagnosis not present

## 2022-02-04 DIAGNOSIS — L89154 Pressure ulcer of sacral region, stage 4: Secondary | ICD-10-CM | POA: Diagnosis not present

## 2022-02-10 DIAGNOSIS — L89154 Pressure ulcer of sacral region, stage 4: Secondary | ICD-10-CM | POA: Diagnosis not present

## 2022-02-16 DIAGNOSIS — M6281 Muscle weakness (generalized): Secondary | ICD-10-CM | POA: Diagnosis not present

## 2022-02-16 DIAGNOSIS — M80051D Age-related osteoporosis with current pathological fracture, right femur, subsequent encounter for fracture with routine healing: Secondary | ICD-10-CM | POA: Diagnosis not present

## 2022-02-16 DIAGNOSIS — L89159 Pressure ulcer of sacral region, unspecified stage: Secondary | ICD-10-CM | POA: Diagnosis not present

## 2022-02-16 DIAGNOSIS — J189 Pneumonia, unspecified organism: Secondary | ICD-10-CM | POA: Diagnosis not present

## 2022-02-17 DIAGNOSIS — L89154 Pressure ulcer of sacral region, stage 4: Secondary | ICD-10-CM | POA: Diagnosis not present

## 2022-02-21 DIAGNOSIS — L89154 Pressure ulcer of sacral region, stage 4: Secondary | ICD-10-CM | POA: Diagnosis not present

## 2022-02-22 DIAGNOSIS — E039 Hypothyroidism, unspecified: Secondary | ICD-10-CM | POA: Diagnosis not present

## 2022-02-22 DIAGNOSIS — E559 Vitamin D deficiency, unspecified: Secondary | ICD-10-CM | POA: Diagnosis not present

## 2022-02-24 DIAGNOSIS — L89154 Pressure ulcer of sacral region, stage 4: Secondary | ICD-10-CM | POA: Diagnosis not present

## 2022-03-03 DIAGNOSIS — L89154 Pressure ulcer of sacral region, stage 4: Secondary | ICD-10-CM | POA: Diagnosis not present

## 2022-03-04 DIAGNOSIS — L89159 Pressure ulcer of sacral region, unspecified stage: Secondary | ICD-10-CM | POA: Diagnosis not present

## 2022-03-04 DIAGNOSIS — M6281 Muscle weakness (generalized): Secondary | ICD-10-CM | POA: Diagnosis not present

## 2022-03-04 DIAGNOSIS — R634 Abnormal weight loss: Secondary | ICD-10-CM | POA: Diagnosis not present

## 2022-03-05 DIAGNOSIS — I953 Hypotension of hemodialysis: Secondary | ICD-10-CM | POA: Diagnosis not present

## 2022-03-07 DIAGNOSIS — L89154 Pressure ulcer of sacral region, stage 4: Secondary | ICD-10-CM | POA: Diagnosis not present

## 2022-03-08 DIAGNOSIS — L89159 Pressure ulcer of sacral region, unspecified stage: Secondary | ICD-10-CM | POA: Diagnosis not present

## 2022-03-08 DIAGNOSIS — N182 Chronic kidney disease, stage 2 (mild): Secondary | ICD-10-CM | POA: Diagnosis not present

## 2022-03-08 DIAGNOSIS — R634 Abnormal weight loss: Secondary | ICD-10-CM | POA: Diagnosis not present

## 2022-03-08 DIAGNOSIS — D509 Iron deficiency anemia, unspecified: Secondary | ICD-10-CM | POA: Diagnosis not present

## 2022-03-09 DIAGNOSIS — G301 Alzheimer's disease with late onset: Secondary | ICD-10-CM | POA: Diagnosis not present

## 2022-03-09 DIAGNOSIS — F02B4 Dementia in other diseases classified elsewhere, moderate, with anxiety: Secondary | ICD-10-CM | POA: Diagnosis not present

## 2022-03-09 DIAGNOSIS — F411 Generalized anxiety disorder: Secondary | ICD-10-CM | POA: Diagnosis not present

## 2022-03-09 DIAGNOSIS — F4321 Adjustment disorder with depressed mood: Secondary | ICD-10-CM | POA: Diagnosis not present

## 2022-03-10 DIAGNOSIS — L89154 Pressure ulcer of sacral region, stage 4: Secondary | ICD-10-CM | POA: Diagnosis not present

## 2022-03-11 ENCOUNTER — Non-Acute Institutional Stay: Payer: Medicare PPO | Admitting: Primary Care

## 2022-03-11 DIAGNOSIS — L89159 Pressure ulcer of sacral region, unspecified stage: Secondary | ICD-10-CM | POA: Diagnosis not present

## 2022-03-11 DIAGNOSIS — Z515 Encounter for palliative care: Secondary | ICD-10-CM | POA: Diagnosis not present

## 2022-03-11 NOTE — Progress Notes (Signed)
[facility] Pall[facility] Care Consult Note Telephone: [phone]  Fax: [phone]   Date of encounter: 03/11/22 12:16 PM PATIENT NAME: [name] [name] [place] [address]   [phone] (home)  DOB: 1946-04-21 MRN: [id number] PRIMARY CARE PROVIDER:    [place],  [place] [address] [phone]  REFERRING PROVIDER:   Rica Koyanagi, MD [place],  [place] [address]  RESPONSIBLE PARTY:    Contact Information     Name Relation Home Work Mobile   [name] [name],[name] [name] Spouse [phone]  [phone]   [name] [name], [name] [name]   [phone]        I met face to face with [facility] in [facility]. Palliative Care was asked to follow this patient by consultation request of  [name] Koyanagi, MD  to address advance care planning and complex medical decision making. This is the initial visit.                                     ASSESSMENT AND PLAN / RECOMMENDATIONS:   Advance Care Planning/Goals of Care: Goals include to maximize quality of life and symptom management. Patient/health care surrogate gave his/her permission to discuss.Our advance care planning conversation included a discussion about:     Exploration of personal, cultural or spiritual beliefs that might influence medical decisions  Exploration of goals of care in the event of a sudden injury or illness  Identification of a healthcare agent - son and DIL Patient states she makes her own decisions but has advanced dementia. Review and updating or creation of an  advance directive document . Attempted to phone POA but both numbers for son and DIL were not connected. CODE STATUS: FULL CODE  Symptom Management/Plan:  Met patient today with non healing wounds, weight loss. She has had 22 % loss of weight over 4 months. She also has stalled/ non healing wounds. I will continue to follow but hospice would be an option if  consistent with goals of care.   She is conversant in her chair, but has taken off her oxygen and states "it comes off" FAST score appears to be 7 B-C. Hip fx in 3/23. She states she is not ambulatory.   Follow up Palliative Care Visit: Palliative care will continue to follow for complex medical decision making, advance care planning, and clarification of goals. Return 4 weeks or prn.  I spent 25 minutes providing this consultation. More than 50% of the time in this consultation was spent in counseling and care coordination.  PPS: 30%  HOSPICE ELIGIBILITY/DIAGNOSIS: yes with concordant goals of care/ Abnormal weight loss Chief Complaint: abnormal weight loss  HISTORY OF PRESENT ILLNESS:  [name] [name] is a 76 y.o. year old female  with wt loss, non healing wounds, dementia . Patient seen today to review palliative care needs to include medical decision making and advance care planning as appropriate.   History obtained from review of EMR, discussion with primary team, and interview with family, facility staff/caregiver and/or [name] [name].  I reviewed available labs, medications, imaging, studies and related documents from the EMR.  Records reviewed and summarized above.   ROS/staff   General: NAD ENMT: denies dysphagia Cardiovascular: denies chest pain, denies DOE Pulmonary: denies cough, denies increased SOB, not using oxygen Abdomen: endorses fair appetite, denies constipation, endorses incontinence of bowel GU: denies  dysuria, endorses incontinence of urine MSK:  denies increased weakness,  no falls reported Skin: denies rashes or wounds Neurological: denies pain, denies insomnia Psych: Endorses positive mood, memory loss Heme/lymph/immuno: denies bruises, abnormal bleeding  Physical Exam: Current and past weights: 99 lbs, 127 lbs in 4/23, 22% Constitutional: NAD General: frail appearing, thin EYES: anicteric sclera, lids intact, no discharge  ENMT: intact hearing, oral  mucous membranes moist, dentition intact CV:  no LE edema Pulmonary: no increased work of breathing, no cough, supplemental oxygen, not in place Abdomen: intake 50%, soft and non tender, no ascites GU: deferred MSK: + sarcopenia, moves all extremities, non ambulatory Skin: warm and dry, staff reports coccyx stage 4, 1.5 x 0.5 cm, 4.7 cm undermine at 10 o'clock. Neuro:  +generalized weakness,  advanced cognitive impairment Psych: non-anxious affect, A and O x 1 Hem/lymph/immuno: no widespread bruising CURRENT PROBLEM LIST:  Patient Active Problem List   Diagnosis Date Noted   DVT (deep venous thrombosis) ([place]) 11/03/2021   Seizure ([place]) 09/20/2021   Pressure injury of skin 09/19/2021   Periprosthetic intertrochanteric fracture of femur 09/18/2021   Closed right hip fracture ([place]) 09/18/2021   Normocytic anemia 09/18/2021   Acute urinary retention 08/22/2021   Acute postoperative anemia due to expected blood loss 08/22/2021   Thrombocytopenia ([place]) 08/22/2021   Acute kidney injury superimposed on CKD ([place]) 08/21/2021   Toenail fungus 08/21/2021   Hyponatremia 08/21/2021   Nausea 08/20/2021   Hypertension    GERD (gastroesophageal reflux disease)    Stage 3a chronic kidney disease (CKD) ([place])    Closed hip fracture requiring operative repair, right, sequela 08/19/2021   Autoimmune hepatitis ([place]) 08/19/2021   Iron deficiency anemia due to chronic blood loss 05/27/2021   Hyperparathyroidism due to renal insufficiency ([place]) 07/08/2019   Erosive gastritis 12/25/2018   Abnormal LFTs 02/20/2017   Senile osteoporosis 02/13/2017   PAST MEDICAL HISTORY:  Active Ambulatory Problems    Diagnosis Date Noted   Abnormal LFTs 02/20/2017   Iron deficiency anemia due to chronic blood loss 05/27/2021   Closed hip fracture requiring operative repair, right, sequela 08/19/2021   Autoimmune hepatitis ([place]) 08/19/2021   Nausea 08/20/2021   Hypertension    GERD (gastroesophageal reflux disease)     Stage 3a chronic kidney disease (CKD) ([place])    Acute kidney injury superimposed on CKD ([place]) 08/21/2021   Toenail fungus 08/21/2021   Hyponatremia 08/21/2021   Acute urinary retention 08/22/2021   Acute postoperative anemia due to expected blood loss 08/22/2021   Thrombocytopenia ([place]) 08/22/2021   Erosive gastritis 12/25/2018   Hyperparathyroidism due to renal insufficiency ([place]) 07/08/2019   Senile osteoporosis 02/13/2017   Periprosthetic intertrochanteric fracture of femur 09/18/2021   Closed right hip fracture ([place]) 09/18/2021   Normocytic anemia 09/18/2021   Pressure injury of skin 09/19/2021   Seizure ([place]) 09/20/2021   DVT (deep venous thrombosis) ([place]) 11/03/2021   Resolved Ambulatory Problems    Diagnosis Date Noted   No Resolved Ambulatory Problems   Past Medical History:  Diagnosis Date   Abnormal LFTs (liver function tests)    Autoimmune hepatitis treated with steroids (HCC)    DOE (dyspnea on exertion)    Hepatitis    Hyperlipidemia    Osteoporosis    SOCIAL HX:  Social History   Tobacco Use   Smoking status: Never   Smokeless tobacco: Never  Substance Use Topics   Alcohol use: No   FAMILY HX:  Family History  Problem Relation Age of Onset  Cancer Mother       ALLERGIES:  Allergies  Allergen Reactions   Sulfa Antibiotics Nausea And Vomiting    Pt has not had a sulfa drug in years and is not certain of reaction but suspects nausea and vomiting.   Sulfasalazine Nausea And Vomiting     PERTINENT MEDICATIONS:  Outpatient Encounter Medications as of 03/11/2022  Medication Sig   acetaminophen (TYLENOL) 325 MG tablet Take 2 tablets (650 mg total) by mouth every 6 (six) hours as needed for mild pain or moderate pain (pain score 1-3 or temp > 100.5).   amLODipine (NORVASC) 5 MG tablet Take 5 mg by mouth daily.   apixaban (ELIQUIS) 5 MG TABS tablet Take 2 tablets (10 mg total) by mouth 2 (two) times daily. From 11/12/2021 take 5 mg twice a day    azaTHIOprine (IMURAN) 50 MG tablet Take 25 mg by mouth daily.   Calcium Carbonate Antacid 600 MG chewable tablet Chew 600 mg by mouth.   calcium citrate-vitamin D (CITRACAL+D) 315-200 MG-UNIT tablet Take 1 tablet by mouth 2 (two) times daily.   cyanocobalamin 100 MCG tablet Take 100 mcg by mouth daily.   estradiol (ESTRACE) 0.1 MG/GM vaginal cream Place 1 Applicatorful vaginally 3 (three) times a week.   feeding supplement (ENSURE ENLIVE / ENSURE PLUS) LIQD Take 237 mLs by mouth 2 (two) times daily between meals.   Ferrous Fumarate (HEMOCYTE - 106 MG FE) 324 (106 Fe) MG TABS tablet Take 1 tablet by mouth daily.   HYDROcodone-acetaminophen (NORCO/VICODIN) 5-325 MG tablet Take 1 tablet by mouth daily as needed.   leptospermum manuka honey (MEDIHONEY) PSTE paste Apply 1 application. topically daily.   metoprolol tartrate (LOPRESSOR) 50 MG tablet Take 50 mg by mouth daily.   Multiple Vitamin (MULTIVITAMIN WITH MINERALS) TABS tablet Take 1 tablet by mouth daily.   omeprazole (PRILOSEC) 40 MG capsule Take 40 mg by mouth daily.   polyethylene glycol (MIRALAX / GLYCOLAX) 17 g packet Take 17 g by mouth daily as needed for mild constipation.   potassium chloride (KLOR-CON M) 10 MEQ tablet Take 10 mEq by mouth daily.   predniSONE (DELTASONE) 5 MG tablet Take 7.5 mg by mouth daily.   No facility-administered encounter medications on file as of 03/11/2022.   Thank you for the opportunity to participate in the care of Ms. Chaisson.  The palliative care team will continue to follow. Please call our office at [phone] if we can be of additional assistance.   Jason Coop, NP , DNP, AGPCNP-BC  COVID-19 PATIENT SCREENING TOOL Asked and negative response unless otherwise noted:  Have you had symptoms of covid, tested positive or been in contact with someone with symptoms/positive test in the past 5-10 days?

## 2022-03-14 DIAGNOSIS — M6259 Muscle wasting and atrophy, not elsewhere classified, multiple sites: Secondary | ICD-10-CM | POA: Diagnosis not present

## 2022-03-14 DIAGNOSIS — R634 Abnormal weight loss: Secondary | ICD-10-CM | POA: Diagnosis not present

## 2022-03-14 DIAGNOSIS — Z741 Need for assistance with personal care: Secondary | ICD-10-CM | POA: Diagnosis not present

## 2022-03-14 DIAGNOSIS — Z736 Limitation of activities due to disability: Secondary | ICD-10-CM | POA: Diagnosis not present

## 2022-03-14 DIAGNOSIS — K754 Autoimmune hepatitis: Secondary | ICD-10-CM | POA: Diagnosis not present

## 2022-03-14 DIAGNOSIS — R0902 Hypoxemia: Secondary | ICD-10-CM | POA: Diagnosis not present

## 2022-03-14 DIAGNOSIS — I10 Essential (primary) hypertension: Secondary | ICD-10-CM | POA: Diagnosis not present

## 2022-03-14 DIAGNOSIS — N952 Postmenopausal atrophic vaginitis: Secondary | ICD-10-CM | POA: Diagnosis not present

## 2022-03-14 DIAGNOSIS — K2100 Gastro-esophageal reflux disease with esophagitis, without bleeding: Secondary | ICD-10-CM | POA: Diagnosis not present

## 2022-03-15 DIAGNOSIS — N952 Postmenopausal atrophic vaginitis: Secondary | ICD-10-CM | POA: Diagnosis not present

## 2022-03-15 DIAGNOSIS — Z736 Limitation of activities due to disability: Secondary | ICD-10-CM | POA: Diagnosis not present

## 2022-03-15 DIAGNOSIS — R0902 Hypoxemia: Secondary | ICD-10-CM | POA: Diagnosis not present

## 2022-03-15 DIAGNOSIS — R634 Abnormal weight loss: Secondary | ICD-10-CM | POA: Diagnosis not present

## 2022-03-15 DIAGNOSIS — I10 Essential (primary) hypertension: Secondary | ICD-10-CM | POA: Diagnosis not present

## 2022-03-15 DIAGNOSIS — M6259 Muscle wasting and atrophy, not elsewhere classified, multiple sites: Secondary | ICD-10-CM | POA: Diagnosis not present

## 2022-03-15 DIAGNOSIS — K2100 Gastro-esophageal reflux disease with esophagitis, without bleeding: Secondary | ICD-10-CM | POA: Diagnosis not present

## 2022-03-15 DIAGNOSIS — K754 Autoimmune hepatitis: Secondary | ICD-10-CM | POA: Diagnosis not present

## 2022-03-15 DIAGNOSIS — Z741 Need for assistance with personal care: Secondary | ICD-10-CM | POA: Diagnosis not present

## 2022-03-16 DIAGNOSIS — M6259 Muscle wasting and atrophy, not elsewhere classified, multiple sites: Secondary | ICD-10-CM | POA: Diagnosis not present

## 2022-03-16 DIAGNOSIS — R0902 Hypoxemia: Secondary | ICD-10-CM | POA: Diagnosis not present

## 2022-03-16 DIAGNOSIS — I10 Essential (primary) hypertension: Secondary | ICD-10-CM | POA: Diagnosis not present

## 2022-03-16 DIAGNOSIS — K754 Autoimmune hepatitis: Secondary | ICD-10-CM | POA: Diagnosis not present

## 2022-03-16 DIAGNOSIS — R634 Abnormal weight loss: Secondary | ICD-10-CM | POA: Diagnosis not present

## 2022-03-16 DIAGNOSIS — K2100 Gastro-esophageal reflux disease with esophagitis, without bleeding: Secondary | ICD-10-CM | POA: Diagnosis not present

## 2022-03-16 DIAGNOSIS — N952 Postmenopausal atrophic vaginitis: Secondary | ICD-10-CM | POA: Diagnosis not present

## 2022-03-16 DIAGNOSIS — Z736 Limitation of activities due to disability: Secondary | ICD-10-CM | POA: Diagnosis not present

## 2022-03-16 DIAGNOSIS — Z741 Need for assistance with personal care: Secondary | ICD-10-CM | POA: Diagnosis not present

## 2022-03-17 DIAGNOSIS — R0902 Hypoxemia: Secondary | ICD-10-CM | POA: Diagnosis not present

## 2022-03-17 DIAGNOSIS — I10 Essential (primary) hypertension: Secondary | ICD-10-CM | POA: Diagnosis not present

## 2022-03-17 DIAGNOSIS — N952 Postmenopausal atrophic vaginitis: Secondary | ICD-10-CM | POA: Diagnosis not present

## 2022-03-17 DIAGNOSIS — K754 Autoimmune hepatitis: Secondary | ICD-10-CM | POA: Diagnosis not present

## 2022-03-17 DIAGNOSIS — Z741 Need for assistance with personal care: Secondary | ICD-10-CM | POA: Diagnosis not present

## 2022-03-17 DIAGNOSIS — L89154 Pressure ulcer of sacral region, stage 4: Secondary | ICD-10-CM | POA: Diagnosis not present

## 2022-03-17 DIAGNOSIS — K2100 Gastro-esophageal reflux disease with esophagitis, without bleeding: Secondary | ICD-10-CM | POA: Diagnosis not present

## 2022-03-17 DIAGNOSIS — M6259 Muscle wasting and atrophy, not elsewhere classified, multiple sites: Secondary | ICD-10-CM | POA: Diagnosis not present

## 2022-03-17 DIAGNOSIS — R634 Abnormal weight loss: Secondary | ICD-10-CM | POA: Diagnosis not present

## 2022-03-17 DIAGNOSIS — Z736 Limitation of activities due to disability: Secondary | ICD-10-CM | POA: Diagnosis not present

## 2022-04-15 ENCOUNTER — Non-Acute Institutional Stay: Payer: Medicare PPO | Admitting: Primary Care

## 2022-04-15 DIAGNOSIS — N1831 Chronic kidney disease, stage 3a: Secondary | ICD-10-CM

## 2022-04-15 DIAGNOSIS — Z515 Encounter for palliative care: Secondary | ICD-10-CM

## 2022-04-15 NOTE — Progress Notes (Addendum)
[facility] Palliative Care Consult Note Telephone: [phone]  Fax: [phone]    Date of encounter: 04/15/22 1:49 PM PATIENT NAME: [name] [name] [place] [address]   [phone] (home)  DOB: May 23, 1946 MRN: [id number] PRIMARY CARE PROVIDER:    [place],  [place] [address] [phone]  REFERRING PROVIDER:   [place],  [place] [address] [phone]  RESPONSIBLE PARTY:    Contact Information     Name Relation Home Work Mobile   Pitstick,Cornelius Spouse [phone]  [phone]   Florena, Kozma   [phone]        I met face to face with patient  at [facility]. Palliative Care was asked to follow this patient by consultation request of  [place] to address advance care planning and complex medical decision making. This is a follow up visit.                                   ASSESSMENT AND PLAN / RECOMMENDATIONS:   Advance Care Planning/Goals of Care: Goals include to maximize quality of life and symptom management. Patient/health care surrogate gave his/her permission to discuss.Our advance care planning conversation included a discussion about:     Exploration of personal, cultural or spiritual beliefs that might influence medical decisions  CODE STATUS: FULL  Symptom Management/Plan:   Has had weight loss but has stabilized past 1-2 months. I would recommend continue with weekly or every other weekly weights.    Interactive today, pleasant, having lunch. Denies discomforts. Endorses being comfortable, no anxiety. Medication list reviewed, no recommended changes at this time. Recent GFR 76.  Follow up Palliative Care Visit: Palliative care will continue to follow for complex medical decision making, advance care planning, and clarification of goals. Return 8 weeks or prn.  I spent 15 minutes providing this  consultation. More than 50% of the time in this consultation was spent in counseling and care coordination.   PPS: 30%  HOSPICE ELIGIBILITY/DIAGNOSIS: TBD  Chief Complaint: debility, immobility  HISTORY OF PRESENT ILLNESS:  [name] [name] is a 76 y.o. year old female  with  immobility, debility, weight loss.  Patient seen today to review palliative care needs to include medical decision making and advance care planning as appropriate.   History obtained from review of EMR, discussion with primary team, and interview with family, facility staff/caregiver and/or Ms. Footman.  I reviewed available labs, medications, imaging, studies and related documents from the EMR.  Records reviewed and summarized above.   ROS   General: NAD EYES: denies vision changes ENMT: denies dysphagia Cardiovascular: denies chest pain, denies DOE Pulmonary: denies cough, denies increased SOB Abdomen: endorses good appetite, denies constipation, endorses incontinence of bowel GU: denies dysuria, endorses incontinence of urine MSK:  denies  increased weakness,  no falls reported Skin: denies rashes or wounds Neurological: denies pain, denies insomnia Psych: Endorses positive mood  Physical Exam: Current and past weights: 101 lbs Constitutional: NAD General: frail appearing, thin EYES: anicteric sclera, lids intact, no discharge  ENMT: intact hearing, oral mucous membranes moist, dentition intact CV:  no LE edema Pulmonary: no increased work of breathing, no cough, supplemental oxygen Abdomen: intake 60%,  no ascites MSK: + sarcopenia, moves all extremities,  non ambulatory Skin: warm and dry, no rashes or wounds on visible skin  Neuro:  + generalized weakness,  + cognitive impairment, non-anxious affect   Thank you for the opportunity to participate in the care of [name] [name].  The palliative care team will continue to follow. Please call our office at [phone] if we can be of additional assistance.    Jason Coop, NP DNP, AGPCNP-BC  COVID-19 PATIENT SCREENING TOOL Asked and negative response unless otherwise noted:   Have you had symptoms of covid, tested positive or been in contact with someone with symptoms/positive test in the past 5-10 days?

## 2022-05-19 ENCOUNTER — Non-Acute Institutional Stay: Payer: Medicare PPO | Admitting: Primary Care

## 2022-05-19 DIAGNOSIS — Z515 Encounter for palliative care: Secondary | ICD-10-CM

## 2022-05-19 DIAGNOSIS — N1831 Chronic kidney disease, stage 3a: Secondary | ICD-10-CM

## 2022-05-19 NOTE — Progress Notes (Signed)
[facility] Palliative Care Consult Note Telephone: [phone]  Fax: [phone]    Date of encounter: 05/19/22 1:57 PM PATIENT NAME: [name] [name] [place] [address]   [phone] (home)  DOB: 1946/04/02 MRN: [id number] PRIMARY CARE PROVIDER:    [place],  [place] [address] [phone]  REFERRING PROVIDER:   [place],  [place] [address] [phone]  RESPONSIBLE PARTY:    Contact Information     Name Relation Home Work Mobile   Vinzant,Cornelius Spouse [phone]  [phone]   Celes, Dedic   [phone]        I met face to face with patient  in [facility]. Palliative Care was asked to follow this patient by consultation request of  [place] to address advance care planning and complex medical decision making. This is a follow up visit.                                   ASSESSMENT AND PLAN / RECOMMENDATIONS:   Advance Care Planning/Goals of Care: Goals include to maximize quality of life and symptom management. Patient/health care surrogate gave his/her permission to discuss. Our advance care planning conversation included a discussion about:     Exploration of personal, cultural or spiritual beliefs that might influence medical  Identification of a healthcare agent -spouse Review of an  advance directive document . CODE StatUS:  FULL CODE Symptom Management/Plan:  I met with patient in her nursing home room. She endorses that she recently had a urinary infection. She stated blood in her urine, and that she did not feel bad from it, but that it was treated. She remains in bed most of the time states her appetite is good and denies any pain or discomfort. She states she doubt she is going home now as she has on former visits stated this as a goal.    Follow up Palliative Care Visit: Palliative care will  continue to follow for complex medical decision making, advance care planning, and clarification of goals. Return 8 weeks or prn.  I spent 15 minutes providing this consultation. More than 50% of the time in this consultation was spent in counseling and care coordination.    PPS: 30%  HOSPICE ELIGIBILITY/DIAGNOSIS: TBD  Chief Complaint: debility  HISTORY OF PRESENT ILLNESS:  [name] [name] is a 76 y.o. year old female  with debility, immobility, CKD. History obtained from review of EMR, discussion with primary team, and interview with family, facility staff/caregiver and/or Ms. Molina.  I reviewed available labs, medications, imaging, studies and related documents from the EMR.  Records reviewed and summarized above.   ROS   General: NAD ENMT: denies dysphagia Cardiovascular: denies chest pain, denies DOE Pulmonary: denies cough, denies increased SOB Abdomen: endorses good appetite, denies constipation, endorses incontinence of bowel GU: denies dysuria, endorses incontinence of urine MSK:  denies increased weakness,  no falls reported Skin: denies rashes or wounds Neurological: denies pain, denies insomnia Psych: Endorses positive mood Heme/lymph/immuno: denies bruises, abnormal bleeding  Physical Exam: Current and past weights: Constitutional: NAD General: frail appearing, thin EYES: anicteric sclera, lids intact, no discharge  ENMT: intact hearing, oral mucous membranes moist Pulmonary: no increased work of breathing, no cough, room air Abdomen: intake 70%,  soft and non tender, no ascites GU: deferred MSK: +  sarcopenia, moves all extremities, non  ambulatory Skin: warm and dry, no rashes or wounds on visible skin Neuro:  + generalized weakness,  + cognitive impairment Psych: non-anxious affect, A and O x 2 Hem/lymph/immuno: no widespread bruising   Thank you for the opportunity to participate in the care of Ms. Barbato.  The palliative care team will continue to  follow. Please call our office at [phone] if we can be of additional assistance.   Jason Coop, NP   COVID-19 PATIENT SCREENING TOOL Asked and negative response unless otherwise noted:   Have you had symptoms of covid, tested positive or been in contact with someone with symptoms/positive test in the past 5-10 days?

## 2022-06-20 ENCOUNTER — Non-Acute Institutional Stay: Payer: Medicare PPO | Admitting: Nurse Practitioner

## 2022-06-20 ENCOUNTER — Encounter: Payer: Self-pay | Admitting: Nurse Practitioner

## 2022-06-20 VITALS — HR 72 | Resp 20 | Wt 112.5 lb

## 2022-06-20 DIAGNOSIS — R5381 Other malaise: Secondary | ICD-10-CM

## 2022-06-20 DIAGNOSIS — Z515 Encounter for palliative care: Secondary | ICD-10-CM

## 2022-06-20 NOTE — Progress Notes (Signed)
[facility] Pall[facility] Care Consult Note Telephone: [phone]  Fax: [phone]    Date of encounter: 06/20/22 11:04 PM PATIENT NAME: [name] [name] [place] [address] [place] [address]   [phone] (home)  DOB: 22-Aug-1945 MRN: [id number] PRIMARY CARE PROVIDER:   [facility], [address]  [address] [place] [address] [phone]  RESPONSIBLE PARTY:    Contact Information     Name Relation Home Work Mobile   Worsley,Cornelius Spouse [phone]  [phone]   Shadaya, Marschner   [phone]      I met face to face with patient in facility. Palliative Care was asked to follow this patient by consultation request of  [place] to address advance care planning and complex medical decision making. This is a follow up visit.                                  ASSESSMENT AND PLAN / RECOMMENDATIONS:  Symptom Management/Plan: 1. Advance Care Planning;  Full code  2. Goals of Care: Goals include to maximize quality of life and symptom management. Our advance care planning conversation included a discussion about:    The value and importance of advance care planning  Exploration of personal, cultural or spiritual beliefs that might influence medical decisions  Exploration of goals of care in the event of a sudden injury or illness  Identification and preparation of a healthcare agent  Review and updating or creation of an advance directive document. 3. Palliative care encounter; Palliative care encounter; Palliative medicine team will continue to support patient, patient's family, and medical team. Visit consisted of counseling and education dealing with the complex and emotionally intense issues of symptom management and palliative care in the setting of serious and potentially life-threatening illness  4. Debility progressive, discussed with [name] [name] importance of mobility, oob,  activities, socialization; supportive role.  Follow up Palliative Care Visit: Palliative care will continue to follow for complex medical decision making, advance care planning, and clarification of goals. Return 4 to 8  weeks or prn.  I spent 45 minutes providing this consultation starting at 1:45 pm. More than 50% of the time in this consultation was spent in counseling and care coordination. PPS: 40% Chief Complaint: Follow up palliative consult for complex medical decision making, address goals, manage ongoing symptoms  HISTORY OF PRESENT ILLNESS:  [name] [name] is a 76 y.o. year old female  with multiple medical problems including HTN, h/o DVT, GERD, h/o erosive gastritis, autoimmune hepatitis, hyperparathyroidism, osteoporosis, h/o right hip fx, stage 3a CKD, h/o urinary retention, h/o thrombocytopenia, normocytic anemia, IDA, seizures. [name] [name] resides at [facility]. [name] [name] requires assistance with mobility, transfers, adl's including bathing, dressing. [name] [name] does feed herself per staff. Appetite good. At present [name] [name] is lying in bed, appears comfortable. [name] [name] and I talked about purpose of pc visit, [name] [name] in agreement. We talked about how she has been feeling, past medical history, social and family history. We talked about residing at facility, functional abilities. We talked about medical goals. Medications, poc reviewed. We talked about quality of life, what brings her joy, ros, appetite, role pc in [place]. No new changes today, Therapeutic listening, emotional support provided. Questions answered. Updated staff, no new changes to poc  History obtained from review of EMR, discussion with primary team, and interview with family, facility  staff/caregiver and/or [name] [name].  I reviewed available labs, medications, imaging, studies and related documents from the EMR.  Records reviewed and summarized above.   ROS 10 point system reviewed all negative except HPI  Physical  Exam: Constitutional: NAD General: frail appearing, pleasant female, debilitated ENMT:  oral mucous membranes moist CV: S1S2, RRR Pulmonary: LCTA Abdomen: normo-active BS + 4 quadrants, soft and non tender MSK: w/c Skin: warm and dry Neuro:  + generalized weakness Psych: non-anxious affect, A and Oriented to self, place Thank you for the opportunity to participate in the care of [name] [name]. Please call our office at [phone] if we can be of additional assistance.   [name], NP

## 2022-08-10 ENCOUNTER — Encounter: Payer: Self-pay | Admitting: Nurse Practitioner

## 2022-08-10 ENCOUNTER — Non-Acute Institutional Stay: Payer: Medicare PPO | Admitting: Nurse Practitioner

## 2022-08-10 DIAGNOSIS — Z515 Encounter for palliative care: Secondary | ICD-10-CM

## 2022-08-10 DIAGNOSIS — R5381 Other malaise: Secondary | ICD-10-CM

## 2022-08-10 NOTE — Progress Notes (Addendum)
[facility] Pall[facility] Consult Note Telephone: [phone]  Fax: [phone]    Date of encounter: 08/10/22 7:12 PM PATIENT NAME: [name] [name] [address] [address]   [phone] (home)  DOB: 1945/11/12 MRN: [id number] PRIMARY CARE PROVIDER:    [facility]  RESPONSIBLE PARTY:    Contact Information     Name Relation Home Work Mobile   Houp,Cornelius Spouse [phone]  [phone]   Zilpha, Mcandrew   [phone]     I met face to face with patient in facility. Palliative Care was asked to follow this patient by consultation request of  [facility] to address advance care planning and complex medical decision making. This is a follow up visit.                                  ASSESSMENT AND PLAN / RECOMMENDATIONS:  Symptom Management/Plan: 1. Advance Care Planning;  Full code   2. Goals of Care: Goals include to maximize quality of life and symptom management. Our advance care planning conversation included a discussion about:    The value and importance of advance care planning  Exploration of personal, cultural or spiritual beliefs that might influence medical decisions  Exploration of goals of care in the event of a sudden injury or illness  Identification and preparation of a healthcare agent  Review and updating or creation of an advance directive document. 3. Palliative care encounter; Palliative care encounter; Palliative medicine team will continue to support patient, patient's family, and medical team. Visit consisted of counseling and education dealing with the complex and emotionally intense issues of symptom management and palliative care in the setting of serious and potentially life-threatening illness   4. Debility progressive, discussed with [name] [name] importance of mobility, oob, activities, socialization; supportive role. PC f/u visit further discussion  monitor trends of appetite, weights, monitor for functional, cognitive decline with chronic disease progression, assess any active symptoms, supportive role. Follow up Palliative Care Visit: Palliative care will continue to follow for complex medical decision making, advance care planning, and clarification of goals. Return 4 to 8  weeks or prn.   I spent 35 minutes providing this consultation starting at 1:45 pm. More than 50% of the time in this consultation was spent in counseling and care coordination. PPS: 40% Chief Complaint: Follow up palliative consult for complex medical decision making, address goals, manage ongoing symptoms   HISTORY OF PRESENT ILLNESS:  [name] [name] is a 77 y.o. year old female  with multiple medical problems including HTN, h/o DVT, GERD, h/o erosive gastritis, autoimmune hepatitis, hyperparathyroidism, osteoporosis, h/o right hip fx, stage 3a CKD, h/o urinary retention, h/o thrombocytopenia, normocytic anemia, IDA, seizures. [name] [name] resides at [facility]. [name] [name] requires assistance with mobility, transfers, adl's including bathing, dressing. [name]. [name] does feed herself per staff. Appetite good. Purpose of today PC f/u visit further discussion monitor trends of appetite, weights, monitor for functional, cognitive decline with chronic disease progression, assess any active symptoms, supportive role. I visited and observed [name] [name] lying in her bed in her room. [name] [name] and I talked about purpose of pc visit, ros, functional abilities, reviewed weights, appetite, foods she likes, pain discussed at length. We talked about residing at facility, quality of life. Supportive role. We talked about family, family dynamics. We talked about coping strategies. Therapeutic listening,  emotional support provided. Questions answered. Medical goals, medications, poc reviewed. No new changes to poc. Updated staff.   We talked about residing at facility, functional abilities. We talked  about medical goals. Medications, poc reviewed. We talked about quality of life, what brings her joy, ros, appetite, role pc in [place]. No new changes today, Therapeutic listening, emotional support provided. Questions answered. Updated staff, no new changes to poc   History obtained from review of EMR, discussion with primary team, and interview with family, facility staff/caregiver and/or [name]. [name].  I reviewed available labs, medications, imaging, studies and related documents from the EMR.  Records reviewed and summarized above.    ROS 10 point system reviewed all negative except HPI   Physical Exam: Constitutional: NAD General: frail appearing, pleasant female, debilitated ENMT:  oral mucous membranes moist CV: S1S2, RRR Pulmonary: LCTA Abdomen: normo-active BS + 4 quadrants, soft and non tender MSK: w/c Skin: warm and dry Neuro:  + generalized weakness Psych: non-anxious affect, A and Oriented to self, place Thank you for the opportunity to participate in the care of [name] [name]. Please call our office at [phone] if we can be of additional assistance.   Dailon Sheeran Ihor Gully, NP

## 2022-09-13 ENCOUNTER — Inpatient Hospital Stay
Admission: EM | Admit: 2022-09-13 | Discharge: 2022-09-16 | DRG: 811 | Disposition: E | Payer: Medicare PPO | Source: Skilled Nursing Facility | Attending: Internal Medicine | Admitting: Internal Medicine

## 2022-09-13 ENCOUNTER — Emergency Department: Payer: Medicare PPO

## 2022-09-13 ENCOUNTER — Other Ambulatory Visit: Payer: Self-pay

## 2022-09-13 DIAGNOSIS — Z79890 Hormone replacement therapy (postmenopausal): Secondary | ICD-10-CM

## 2022-09-13 DIAGNOSIS — E860 Dehydration: Secondary | ICD-10-CM | POA: Diagnosis present

## 2022-09-13 DIAGNOSIS — R7401 Elevation of levels of liver transaminase levels: Secondary | ICD-10-CM | POA: Diagnosis present

## 2022-09-13 DIAGNOSIS — D62 Acute posthemorrhagic anemia: Secondary | ICD-10-CM | POA: Diagnosis not present

## 2022-09-13 DIAGNOSIS — R609 Edema, unspecified: Secondary | ICD-10-CM | POA: Diagnosis present

## 2022-09-13 DIAGNOSIS — G9341 Metabolic encephalopathy: Secondary | ICD-10-CM | POA: Diagnosis present

## 2022-09-13 DIAGNOSIS — Z7952 Long term (current) use of systemic steroids: Secondary | ICD-10-CM

## 2022-09-13 DIAGNOSIS — I4891 Unspecified atrial fibrillation: Secondary | ICD-10-CM

## 2022-09-13 DIAGNOSIS — R231 Pallor: Secondary | ICD-10-CM | POA: Diagnosis present

## 2022-09-13 DIAGNOSIS — Z86718 Personal history of other venous thrombosis and embolism: Secondary | ICD-10-CM

## 2022-09-13 DIAGNOSIS — I469 Cardiac arrest, cause unspecified: Secondary | ICD-10-CM

## 2022-09-13 DIAGNOSIS — D631 Anemia in chronic kidney disease: Secondary | ICD-10-CM | POA: Diagnosis present

## 2022-09-13 DIAGNOSIS — Z66 Do not resuscitate: Secondary | ICD-10-CM | POA: Diagnosis present

## 2022-09-13 DIAGNOSIS — R7989 Other specified abnormal findings of blood chemistry: Secondary | ICD-10-CM | POA: Diagnosis present

## 2022-09-13 DIAGNOSIS — D649 Anemia, unspecified: Secondary | ICD-10-CM | POA: Diagnosis present

## 2022-09-13 DIAGNOSIS — I129 Hypertensive chronic kidney disease with stage 1 through stage 4 chronic kidney disease, or unspecified chronic kidney disease: Secondary | ICD-10-CM | POA: Diagnosis present

## 2022-09-13 DIAGNOSIS — J069 Acute upper respiratory infection, unspecified: Secondary | ICD-10-CM | POA: Diagnosis present

## 2022-09-13 DIAGNOSIS — B974 Respiratory syncytial virus as the cause of diseases classified elsewhere: Secondary | ICD-10-CM | POA: Diagnosis present

## 2022-09-13 DIAGNOSIS — R000 Tachycardia, unspecified: Secondary | ICD-10-CM | POA: Diagnosis present

## 2022-09-13 DIAGNOSIS — R54 Age-related physical debility: Secondary | ICD-10-CM | POA: Diagnosis present

## 2022-09-13 DIAGNOSIS — A419 Sepsis, unspecified organism: Secondary | ICD-10-CM

## 2022-09-13 DIAGNOSIS — L89159 Pressure ulcer of sacral region, unspecified stage: Secondary | ICD-10-CM | POA: Insufficient documentation

## 2022-09-13 DIAGNOSIS — I2489 Other forms of acute ischemic heart disease: Secondary | ICD-10-CM | POA: Diagnosis present

## 2022-09-13 DIAGNOSIS — Z6820 Body mass index (BMI) 20.0-20.9, adult: Secondary | ICD-10-CM

## 2022-09-13 DIAGNOSIS — Z7901 Long term (current) use of anticoagulants: Secondary | ICD-10-CM

## 2022-09-13 DIAGNOSIS — R634 Abnormal weight loss: Secondary | ICD-10-CM | POA: Diagnosis present

## 2022-09-13 DIAGNOSIS — Z90710 Acquired absence of both cervix and uterus: Secondary | ICD-10-CM

## 2022-09-13 DIAGNOSIS — L89154 Pressure ulcer of sacral region, stage 4: Secondary | ICD-10-CM | POA: Diagnosis present

## 2022-09-13 DIAGNOSIS — I10 Essential (primary) hypertension: Secondary | ICD-10-CM | POA: Diagnosis present

## 2022-09-13 DIAGNOSIS — Z809 Family history of malignant neoplasm, unspecified: Secondary | ICD-10-CM

## 2022-09-13 DIAGNOSIS — K219 Gastro-esophageal reflux disease without esophagitis: Secondary | ICD-10-CM | POA: Diagnosis present

## 2022-09-13 DIAGNOSIS — R413 Other amnesia: Secondary | ICD-10-CM | POA: Diagnosis present

## 2022-09-13 DIAGNOSIS — D759 Disease of blood and blood-forming organs, unspecified: Secondary | ICD-10-CM | POA: Diagnosis present

## 2022-09-13 DIAGNOSIS — E785 Hyperlipidemia, unspecified: Secondary | ICD-10-CM | POA: Diagnosis present

## 2022-09-13 DIAGNOSIS — Z79899 Other long term (current) drug therapy: Secondary | ICD-10-CM

## 2022-09-13 DIAGNOSIS — K754 Autoimmune hepatitis: Secondary | ICD-10-CM | POA: Diagnosis present

## 2022-09-13 DIAGNOSIS — D500 Iron deficiency anemia secondary to blood loss (chronic): Secondary | ICD-10-CM

## 2022-09-13 DIAGNOSIS — E872 Acidosis, unspecified: Secondary | ICD-10-CM | POA: Insufficient documentation

## 2022-09-13 DIAGNOSIS — N1831 Chronic kidney disease, stage 3a: Secondary | ICD-10-CM | POA: Diagnosis present

## 2022-09-13 DIAGNOSIS — Z8616 Personal history of COVID-19: Secondary | ICD-10-CM

## 2022-09-13 DIAGNOSIS — B338 Other specified viral diseases: Secondary | ICD-10-CM

## 2022-09-13 DIAGNOSIS — Z96641 Presence of right artificial hip joint: Secondary | ICD-10-CM | POA: Diagnosis present

## 2022-09-13 DIAGNOSIS — Z882 Allergy status to sulfonamides status: Secondary | ICD-10-CM

## 2022-09-13 DIAGNOSIS — M810 Age-related osteoporosis without current pathological fracture: Secondary | ICD-10-CM | POA: Diagnosis present

## 2022-09-13 LAB — BLOOD GAS, VENOUS
Acid-base deficit: 2 mmol/L (ref 0.0–2.0)
Bicarbonate: 23.7 mmol/L (ref 20.0–28.0)
O2 Saturation: 23.4 %
Patient temperature: 37
pCO2, Ven: 43 mmHg — ABNORMAL LOW (ref 44–60)
pH, Ven: 7.35 (ref 7.25–7.43)
pO2, Ven: <31 mmHg — CL (ref 32–45)

## 2022-09-13 LAB — CBC WITH DIFFERENTIAL/PLATELET
Abs Immature Granulocytes: 0.09 10*3/uL — ABNORMAL HIGH (ref 0.00–0.07)
Basophils Absolute: 0 10*3/uL (ref 0.0–0.1)
Basophils Relative: 0 %
Eosinophils Absolute: 0 10*3/uL (ref 0.0–0.5)
Eosinophils Relative: 0 %
HCT: 29.2 % — ABNORMAL LOW (ref 36.0–46.0)
Hemoglobin: 7.6 g/dL — ABNORMAL LOW (ref 12.0–15.0)
Immature Granulocytes: 1 %
Lymphocytes Relative: 4 %
Lymphs Abs: 0.4 10*3/uL — ABNORMAL LOW (ref 0.7–4.0)
MCH: 20.3 pg — ABNORMAL LOW (ref 26.0–34.0)
MCHC: 26 g/dL — ABNORMAL LOW (ref 30.0–36.0)
MCV: 78.1 fL — ABNORMAL LOW (ref 80.0–100.0)
Monocytes Absolute: 0.7 10*3/uL (ref 0.1–1.0)
Monocytes Relative: 8 %
Neutro Abs: 8.6 10*3/uL — ABNORMAL HIGH (ref 1.7–7.7)
Neutrophils Relative %: 87 %
Platelets: 628 10*3/uL — ABNORMAL HIGH (ref 150–400)
RBC: 3.74 MIL/uL — ABNORMAL LOW (ref 3.87–5.11)
RDW: 23.1 % — ABNORMAL HIGH (ref 11.5–15.5)
Smear Review: NORMAL
WBC: 9.8 10*3/uL (ref 4.0–10.5)
nRBC: 0.5 % — ABNORMAL HIGH (ref 0.0–0.2)

## 2022-09-13 LAB — COMPREHENSIVE METABOLIC PANEL
ALT: 53 U/L — ABNORMAL HIGH (ref 0–44)
AST: 112 U/L — ABNORMAL HIGH (ref 15–41)
Albumin: 3.5 g/dL (ref 3.5–5.0)
Alkaline Phosphatase: 75 U/L (ref 38–126)
Anion gap: 16 — ABNORMAL HIGH (ref 5–15)
BUN: 44 mg/dL — ABNORMAL HIGH (ref 8–23)
CO2: 23 mmol/L (ref 22–32)
Calcium: 11.2 mg/dL — ABNORMAL HIGH (ref 8.9–10.3)
Chloride: 98 mmol/L (ref 98–111)
Creatinine, Ser: 1.23 mg/dL — ABNORMAL HIGH (ref 0.44–1.00)
GFR, Estimated: 46 mL/min — ABNORMAL LOW (ref >60)
Glucose, Bld: 155 mg/dL — ABNORMAL HIGH (ref 70–99)
Potassium: 4.3 mmol/L (ref 3.5–5.1)
Sodium: 137 mmol/L (ref 135–145)
Total Bilirubin: 1.4 mg/dL — ABNORMAL HIGH (ref 0.3–1.2)
Total Protein: 7.7 g/dL (ref 6.5–8.1)

## 2022-09-13 LAB — AMMONIA: Ammonia: 10 umol/L (ref 9–35)

## 2022-09-13 LAB — URINALYSIS, W/ REFLEX TO CULTURE (INFECTION SUSPECTED)
Bilirubin Urine: NEGATIVE
Glucose, UA: NEGATIVE mg/dL
Hgb urine dipstick: NEGATIVE
Ketones, ur: NEGATIVE mg/dL
Leukocytes,Ua: NEGATIVE
Nitrite: NEGATIVE
Protein, ur: 100 mg/dL — AB
Specific Gravity, Urine: 1.016 (ref 1.005–1.030)
pH: 5 (ref 5.0–8.0)

## 2022-09-13 LAB — LACTIC ACID, PLASMA
Lactic Acid, Venous: 5.9 mmol/L (ref 0.5–1.9)
Lactic Acid, Venous: 3.2 mmol/L (ref 0.5–1.9)

## 2022-09-13 LAB — LIPASE, BLOOD: Lipase: 38 U/L (ref 11–51)

## 2022-09-13 LAB — APTT: aPTT: 40 seconds — ABNORMAL HIGH (ref 24–36)

## 2022-09-13 LAB — RESP PANEL BY RT-PCR (RSV, FLU A&B, COVID)  RVPGX2
Influenza A by PCR: NEGATIVE
Influenza B by PCR: NEGATIVE
Resp Syncytial Virus by PCR: POSITIVE — AB
SARS Coronavirus 2 by RT PCR: NEGATIVE

## 2022-09-13 LAB — PROTIME-INR
INR: 4.1 (ref 0.8–1.2)
Prothrombin Time: 39.7 seconds — ABNORMAL HIGH (ref 11.4–15.2)

## 2022-09-13 MED ORDER — MELATONIN 5 MG PO TABS
5.0000 mg | ORAL_TABLET | Freq: Every day | ORAL | Status: DC
  Administered 2022-09-14 – 2022-09-15 (×3): 5 mg via ORAL
  Filled 2022-09-13 (×3): qty 1

## 2022-09-13 MED ORDER — ONDANSETRON HCL 4 MG/2ML IJ SOLN
4.0000 mg | Freq: Once | INTRAMUSCULAR | Status: AC
  Administered 2022-09-13: 4 mg via INTRAVENOUS
  Filled 2022-09-13: qty 2

## 2022-09-13 MED ORDER — SERTRALINE HCL 50 MG PO TABS
25.0000 mg | ORAL_TABLET | Freq: Every day | ORAL | Status: DC
  Administered 2022-09-14 – 2022-09-15 (×2): 25 mg via ORAL
  Filled 2022-09-13 (×2): qty 1

## 2022-09-13 MED ORDER — ACETAMINOPHEN 325 MG PO TABS: 650.0000 mg | ORAL_TABLET | Freq: Four times a day (QID) | ORAL | Status: DC | PRN

## 2022-09-13 MED ORDER — AZATHIOPRINE 50 MG PO TABS
25.0000 mg | ORAL_TABLET | Freq: Every day | ORAL | Status: DC
  Administered 2022-09-14: 25 mg via ORAL
  Filled 2022-09-13: qty 1

## 2022-09-13 MED ORDER — SODIUM CHLORIDE 0.9 % IV BOLUS (SEPSIS)
500.0000 mL | Freq: Once | INTRAVENOUS | Status: AC
  Administered 2022-09-13: 500 mL via INTRAVENOUS

## 2022-09-13 MED ORDER — SODIUM CHLORIDE 0.9 % IV BOLUS (SEPSIS)
1000.0000 mL | Freq: Once | INTRAVENOUS | Status: AC
  Administered 2022-09-13: 1000 mL via INTRAVENOUS

## 2022-09-13 MED ORDER — PREDNISONE 5 MG PO TABS
7.5000 mg | ORAL_TABLET | Freq: Every day | ORAL | Status: DC
  Administered 2022-09-14 – 2022-09-15 (×2): 7.5 mg via ORAL
  Filled 2022-09-13 (×2): qty 1

## 2022-09-13 MED ORDER — SODIUM CHLORIDE 0.9 % IV SOLN
2.0000 g | INTRAVENOUS | Status: DC
  Administered 2022-09-13: 2 g via INTRAVENOUS
  Filled 2022-09-13 (×2): qty 20

## 2022-09-13 MED ORDER — METOPROLOL TARTRATE 25 MG PO TABS
25.0000 mg | ORAL_TABLET | Freq: Two times a day (BID) | ORAL | Status: DC
  Administered 2022-09-14 – 2022-09-15 (×5): 25 mg via ORAL
  Filled 2022-09-13 (×5): qty 1

## 2022-09-13 NOTE — ED Notes (Addendum)
Patient noted to have a stage 3 pressure ulcer to sacrum. Mepalex applied. MD made aware.

## 2022-09-13 NOTE — Code Documentation (Signed)
CODE SEPSIS - PHARMACY COMMUNICATION  **Broad Spectrum Antibiotics should be administered within 1 hour of Sepsis diagnosis**  Time Code Sepsis Called/Page Received: 2032  Antibiotics Ordered: rocephin  Time of 1st antibiotic administration: 2041    Darrick Penna ,PharmD Clinical Pharmacist  09/14/2022  8:55 PM

## 2022-09-13 NOTE — ED Notes (Signed)
Dr. Joni Fears notified of lactic acid of 5.9.

## 2022-09-13 NOTE — Sepsis Progress Note (Signed)
Elink monitoring for the code sepsis protocol.  

## 2022-09-13 NOTE — ED Provider Notes (Signed)
[facility] Provider Note    Event Date/Time   First MD Initiated Contact with Patient 08/25/2022 2022     (approximate)   History   Chief Complaint: Hypotension (/)   HPI  [name] [name] is a 77 y.o. female with a history of hypertension, autoimmune hepatitis, DVT, CKD who was sent to the ED due to hypotension and tachycardia.  She was at [facility], noted to have a heart rate of about 150, blood pressure of 70/50.  Patient denies any pain.  Not able to answer more complicated questions, very poor memory     Physical Exam   Triage Vital Signs: ED Triage Vitals [09/09/2022 2037]  Enc Vitals Group     BP (!) 77/56     Pulse Rate (!) 157     Resp (!) 41     Temp 97.6 F (36.4 C)     Temp Source Oral     SpO2 100 %     Weight 115 lb (52.2 kg)     Height '5\' 3"'$  (1.6 m)     Head Circumference      [facility]      Pain Score 0     Pain Loc      Pain Edu?      Excl. in [place]?     Most recent vital signs: Vitals:   08/29/2022 2200 08/22/2022 2210  BP: (!) 156/82 (!) 165/90  Pulse:    Resp: (!) 34 (!) 29  Temp:    SpO2:  98%    General: Chronically ill-appearing, not in acute distress.  CV:  Good peripheral perfusion.  Tachycardia, heart rate 150 Resp:  Normal effort.  Mild tachypnea.  Lungs clear to auscultation bilaterally Abd:  No distention.  Soft, nontender Other:  Mild facial edema.  Dry mucous membranes.  Subconjunctival pallor.  Stage III decubitus ulcer over the sacrum.   ED Results / Procedures / Treatments   Labs (all labs ordered are listed, but only abnormal results are displayed) Labs Reviewed  RESP PANEL BY RT-PCR (RSV, FLU A&B, COVID)  RVPGX2 - Abnormal; Notable for the following components:      Result Value   Resp Syncytial Virus by PCR POSITIVE (*)    All other components within normal limits  LACTIC ACID, PLASMA - Abnormal; Notable for the following components:   Lactic Acid, Venous 5.9 (*)    All other components  within normal limits  COMPREHENSIVE METABOLIC PANEL - Abnormal; Notable for the following components:   Glucose, Bld 155 (*)    BUN 44 (*)    Creatinine, Ser 1.23 (*)    Calcium 11.2 (*)    AST 112 (*)    ALT 53 (*)    Total Bilirubin 1.4 (*)    GFR, Estimated 46 (*)    Anion gap 16 (*)    All other components within normal limits  CBC WITH DIFFERENTIAL/PLATELET - Abnormal; Notable for the following components:   RBC 3.74 (*)    Hemoglobin 7.6 (*)    HCT 29.2 (*)    MCV 78.1 (*)    MCH 20.3 (*)    MCHC 26.0 (*)    RDW 23.1 (*)    Platelets 628 (*)    nRBC 0.5 (*)    Neutro Abs 8.6 (*)    Lymphs Abs 0.4 (*)    Abs Immature Granulocytes 0.09 (*)    All other components within normal limits  URINALYSIS, W/ REFLEX  TO CULTURE (INFECTION SUSPECTED) - Abnormal; Notable for the following components:   Color, Urine YELLOW (*)    APPearance CLOUDY (*)    Protein, ur 100 (*)    Bacteria, UA RARE (*)    All other components within normal limits  BLOOD GAS, VENOUS - Abnormal; Notable for the following components:   pCO2, Ven 43 (*)    pO2, Ven <31 (*)    All other components within normal limits  PROTIME-INR - Abnormal; Notable for the following components:   Prothrombin Time 39.7 (*)    INR 4.1 (*)    All other components within normal limits  APTT - Abnormal; Notable for the following components:   aPTT 40 (*)    All other components within normal limits  CULTURE, BLOOD (ROUTINE X 2)  CULTURE, BLOOD (ROUTINE X 2)  LIPASE, BLOOD  AMMONIA  LACTIC ACID, PLASMA  TYPE AND SCREEN     EKG Interpreted by me Atrial fibrillation, rate of 159.  Left axis, normal intervals.  LVH with repolarization abnormality in the high lateral leads..  Diffuse mild ST depressions, nonischemic T waves.  Consistent with rate related demand ischemia   RADIOLOGY Chest xray interpreted by me, appears normal. Radiology report reviewed.   PROCEDURES:  .Critical Care  Performed by: Carrie Mew, MD Authorized by: Carrie Mew, MD   Critical care provider statement:    Critical care time (minutes):  35   Critical care time was exclusive of:  Separately billable procedures and treating other patients   Critical care was necessary to treat or prevent imminent or life-threatening deterioration of the following conditions:  Sepsis, shock, hepatic failure and renal failure   Critical care was time spent personally by me on the following activities:  Development of treatment plan with patient or surrogate, discussions with consultants, evaluation of patient's response to treatment, examination of patient, obtaining history from patient or surrogate, ordering and performing treatments and interventions, ordering and review of laboratory studies, ordering and review of radiographic studies, pulse oximetry, re-evaluation of patient's condition and review of old charts   Care discussed with: admitting provider      MEDICATIONS ORDERED IN ED: Medications  cefTRIAXone (ROCEPHIN) 2 g in sodium chloride 0.9 % 100 mL IVPB (0 g Intravenous Stopped 09/02/2022 2114)  sodium chloride 0.9 % bolus 1,000 mL (0 mLs Intravenous Stopped 08/23/2022 2214)    And  sodium chloride 0.9 % bolus 500 mL (0 mLs Intravenous Stopped 09/14/2022 2214)  ondansetron (ZOFRAN) injection 4 mg (4 mg Intravenous Given 09/01/2022 2250)     IMPRESSION / MDM / Tool / ED COURSE  I reviewed the triage vital signs and the nursing notes.  DDx: Dehydration, UTI, pneumonia, sepsis, anemia, electrolyte abnormality, liver failure, kidney failure, viral illness, hepatic encephalopathy, hypercapnia, pancreatitis, anasarca  Patient's presentation is most consistent with acute presentation with potential threat to life or bodily function.  Patient presents with tachycardia, hypotension, very ill-appearing on exam.  No clear infectious source on exam, but sepsis is high on differential.  Will start Rocephin, fluid boluses.   Obtain lab panel.  Will need to admit.   Clinical Course as of 09/02/2022 2323  Tue Sep 13, 2022  2159 With fluids, patient converted from atrial fibrillation to a sinus rhythm, heart rate of 90, blood pressure normalized.  Will follow-up remainder of labs, repeat lactate. [PS]    Clinical Course User Index [PS] Carrie Mew, MD    ----------------------------------------- 11:20 PM on 08/19/2022 -----------------------------------------  RSV positive. Will plan to admit due to poor baseline state of health and concern for sepsis.   FINAL CLINICAL IMPRESSION(S) / ED DIAGNOSES   Final diagnoses:  Septic shock ([place])  Atrial fibrillation with RVR ([place])  RSV infection     Rx / DC Orders   ED Discharge Orders     None        Note:  This document was prepared using Dragon voice recognition software and may include unintentional dictation errors.   Carrie Mew, MD 08/28/2022 [phone]

## 2022-09-13 NOTE — ED Triage Notes (Signed)
Patient arrives via [facility] for cc of hypotension and tachycardia. Patient comes from [facility], called out for generally feeling unwell. Patient with reported hx of hepatitis, reports looking more jaundice. Patient HR with EMS 120-170, afib, patient with reported hx of same. Patient hypotensive to 70/50 with EMS.  Patient is alert and conversive but only oriented to self at this time.

## 2022-09-13 NOTE — Sepsis Progress Note (Signed)
Confirmed with Bedside RN, Antibiotics were given after blood cultures were collected.

## 2022-09-13 NOTE — ED Notes (Signed)
Karma Greaser, MD made aware of patient's critical INR of 4.1.

## 2022-09-13 NOTE — ED Notes (Signed)
Patient updated on results and plan of care. Denies further questions at this time.

## 2022-09-14 DIAGNOSIS — Z66 Do not resuscitate: Secondary | ICD-10-CM | POA: Diagnosis present

## 2022-09-14 DIAGNOSIS — N1831 Chronic kidney disease, stage 3a: Secondary | ICD-10-CM

## 2022-09-14 DIAGNOSIS — D649 Anemia, unspecified: Secondary | ICD-10-CM

## 2022-09-14 DIAGNOSIS — B974 Respiratory syncytial virus as the cause of diseases classified elsewhere: Secondary | ICD-10-CM | POA: Diagnosis present

## 2022-09-14 DIAGNOSIS — I10 Essential (primary) hypertension: Secondary | ICD-10-CM

## 2022-09-14 DIAGNOSIS — B338 Other specified viral diseases: Secondary | ICD-10-CM | POA: Diagnosis not present

## 2022-09-14 DIAGNOSIS — K754 Autoimmune hepatitis: Secondary | ICD-10-CM | POA: Diagnosis not present

## 2022-09-14 DIAGNOSIS — E785 Hyperlipidemia, unspecified: Secondary | ICD-10-CM | POA: Diagnosis present

## 2022-09-14 DIAGNOSIS — Z7901 Long term (current) use of anticoagulants: Secondary | ICD-10-CM | POA: Diagnosis not present

## 2022-09-14 DIAGNOSIS — D62 Acute posthemorrhagic anemia: Secondary | ICD-10-CM | POA: Diagnosis present

## 2022-09-14 DIAGNOSIS — D631 Anemia in chronic kidney disease: Secondary | ICD-10-CM | POA: Diagnosis present

## 2022-09-14 DIAGNOSIS — R7989 Other specified abnormal findings of blood chemistry: Secondary | ICD-10-CM | POA: Diagnosis not present

## 2022-09-14 DIAGNOSIS — R634 Abnormal weight loss: Secondary | ICD-10-CM | POA: Diagnosis present

## 2022-09-14 DIAGNOSIS — E860 Dehydration: Secondary | ICD-10-CM | POA: Diagnosis present

## 2022-09-14 DIAGNOSIS — M810 Age-related osteoporosis without current pathological fracture: Secondary | ICD-10-CM | POA: Diagnosis present

## 2022-09-14 DIAGNOSIS — G9341 Metabolic encephalopathy: Secondary | ICD-10-CM | POA: Diagnosis present

## 2022-09-14 DIAGNOSIS — D500 Iron deficiency anemia secondary to blood loss (chronic): Secondary | ICD-10-CM | POA: Diagnosis not present

## 2022-09-14 DIAGNOSIS — Z86718 Personal history of other venous thrombosis and embolism: Secondary | ICD-10-CM

## 2022-09-14 DIAGNOSIS — E872 Acidosis, unspecified: Secondary | ICD-10-CM | POA: Diagnosis present

## 2022-09-14 DIAGNOSIS — R54 Age-related physical debility: Secondary | ICD-10-CM | POA: Diagnosis present

## 2022-09-14 DIAGNOSIS — Z8616 Personal history of COVID-19: Secondary | ICD-10-CM | POA: Diagnosis not present

## 2022-09-14 DIAGNOSIS — L89154 Pressure ulcer of sacral region, stage 4: Secondary | ICD-10-CM | POA: Diagnosis present

## 2022-09-14 DIAGNOSIS — I469 Cardiac arrest, cause unspecified: Secondary | ICD-10-CM | POA: Diagnosis not present

## 2022-09-14 DIAGNOSIS — I129 Hypertensive chronic kidney disease with stage 1 through stage 4 chronic kidney disease, or unspecified chronic kidney disease: Secondary | ICD-10-CM | POA: Diagnosis present

## 2022-09-14 DIAGNOSIS — I2489 Other forms of acute ischemic heart disease: Secondary | ICD-10-CM | POA: Diagnosis present

## 2022-09-14 DIAGNOSIS — I4891 Unspecified atrial fibrillation: Secondary | ICD-10-CM | POA: Diagnosis present

## 2022-09-14 LAB — C DIFFICILE QUICK SCREEN W PCR REFLEX
C Diff antigen: NEGATIVE
C Diff interpretation: NOT DETECTED
C Diff toxin: NEGATIVE

## 2022-09-14 LAB — IRON AND TIBC
Iron: 12 ug/dL — ABNORMAL LOW (ref 28–170)
Saturation Ratios: 5 % — ABNORMAL LOW (ref 10.4–31.8)
TIBC: 249 ug/dL — ABNORMAL LOW (ref 250–450)
UIBC: 237 ug/dL

## 2022-09-14 LAB — HEMOGLOBIN: Hemoglobin: 9.4 g/dL — ABNORMAL LOW (ref 12.0–15.0)

## 2022-09-14 LAB — CBC WITH DIFFERENTIAL/PLATELET
Abs Immature Granulocytes: 0.04 10*3/uL (ref 0.00–0.07)
Basophils Absolute: 0 10*3/uL (ref 0.0–0.1)
Basophils Relative: 0 %
Eosinophils Absolute: 0 10*3/uL (ref 0.0–0.5)
Eosinophils Relative: 0 %
HCT: 26.3 % — ABNORMAL LOW (ref 36.0–46.0)
Hemoglobin: 7.6 g/dL — ABNORMAL LOW (ref 12.0–15.0)
Immature Granulocytes: 1 %
Lymphocytes Relative: 8 %
Lymphs Abs: 0.6 10*3/uL — ABNORMAL LOW (ref 0.7–4.0)
MCH: 22.1 pg — ABNORMAL LOW (ref 26.0–34.0)
MCHC: 28.9 g/dL — ABNORMAL LOW (ref 30.0–36.0)
MCV: 76.5 fL — ABNORMAL LOW (ref 80.0–100.0)
Monocytes Absolute: 0.6 10*3/uL (ref 0.1–1.0)
Monocytes Relative: 9 %
Neutro Abs: 5.6 10*3/uL (ref 1.7–7.7)
Neutrophils Relative %: 82 %
Platelets: 254 10*3/uL (ref 150–400)
RBC: 3.44 MIL/uL — ABNORMAL LOW (ref 3.87–5.11)
RDW: 20.6 % — ABNORMAL HIGH (ref 11.5–15.5)
WBC: 6.9 10*3/uL (ref 4.0–10.5)
nRBC: 0.3 % — ABNORMAL HIGH (ref 0.0–0.2)

## 2022-09-14 LAB — BASIC METABOLIC PANEL
Anion gap: 11 (ref 5–15)
BUN: 48 mg/dL — ABNORMAL HIGH (ref 8–23)
CO2: 22 mmol/L (ref 22–32)
Calcium: 9.8 mg/dL (ref 8.9–10.3)
Chloride: 107 mmol/L (ref 98–111)
Creatinine, Ser: 1.01 mg/dL — ABNORMAL HIGH (ref 0.44–1.00)
GFR, Estimated: 58 mL/min — ABNORMAL LOW (ref >60)
Glucose, Bld: 124 mg/dL — ABNORMAL HIGH (ref 70–99)
Potassium: 3.6 mmol/L (ref 3.5–5.1)
Sodium: 140 mmol/L (ref 135–145)

## 2022-09-14 LAB — PREPARE RBC (CROSSMATCH)

## 2022-09-14 LAB — CBC
HCT: 20.5 % — ABNORMAL LOW (ref 36.0–46.0)
Hemoglobin: 5.5 g/dL — ABNORMAL LOW (ref 12.0–15.0)
MCH: 20.5 pg — ABNORMAL LOW (ref 26.0–34.0)
MCHC: 26.8 g/dL — ABNORMAL LOW (ref 30.0–36.0)
MCV: 76.5 fL — ABNORMAL LOW (ref 80.0–100.0)
Platelets: 307 10*3/uL (ref 150–400)
RBC: 2.68 MIL/uL — ABNORMAL LOW (ref 3.87–5.11)
RDW: 22.6 % — ABNORMAL HIGH (ref 11.5–15.5)
WBC: 10.2 10*3/uL (ref 4.0–10.5)
nRBC: 0.2 % (ref 0.0–0.2)

## 2022-09-14 LAB — LACTATE DEHYDROGENASE: LDH: 361 U/L — ABNORMAL HIGH (ref 98–192)

## 2022-09-14 LAB — HEMOGLOBIN AND HEMATOCRIT, BLOOD
HCT: 26.1 % — ABNORMAL LOW (ref 36.0–46.0)
Hemoglobin: 7.5 g/dL — ABNORMAL LOW (ref 12.0–15.0)

## 2022-09-14 LAB — RETIC PANEL
Immature Retic Fract: 40.6 % — ABNORMAL HIGH (ref 2.3–15.9)
RBC.: 3.42 MIL/uL — ABNORMAL LOW (ref 3.87–5.11)
Retic Count, Absolute: 114.6 10*3/uL (ref 19.0–186.0)
Retic Ct Pct: 3.4 % — ABNORMAL HIGH (ref 0.4–3.1)
Reticulocyte Hemoglobin: 18.8 pg — ABNORMAL LOW (ref >27.9)

## 2022-09-14 LAB — RETICULOCYTES
Immature Retic Fract: 27.2 % — ABNORMAL HIGH (ref 2.3–15.9)
RBC.: 2.78 MIL/uL — ABNORMAL LOW (ref 3.87–5.11)
Retic Count, Absolute: 100.6 10*3/uL (ref 19.0–186.0)
Retic Ct Pct: 3.6 % — ABNORMAL HIGH (ref 0.4–3.1)

## 2022-09-14 LAB — FOLATE: Folate: 18.8 ng/mL (ref >5.9)

## 2022-09-14 LAB — TECHNOLOGIST SMEAR REVIEW: Plt Morphology: NORMAL

## 2022-09-14 LAB — BILIRUBIN, FRACTIONATED(TOT/DIR/INDIR)
Bilirubin, Direct: 0.2 mg/dL (ref 0.0–0.2)
Indirect Bilirubin: 1.1 mg/dL — ABNORMAL HIGH (ref 0.3–0.9)
Total Bilirubin: 1.3 mg/dL — ABNORMAL HIGH (ref 0.3–1.2)

## 2022-09-14 LAB — FERRITIN: Ferritin: 223 ng/mL (ref 11–307)

## 2022-09-14 LAB — OCCULT BLOOD X 1 CARD TO LAB, STOOL: Fecal Occult Bld: NEGATIVE

## 2022-09-14 LAB — VITAMIN B12: Vitamin B-12: 1248 pg/mL — ABNORMAL HIGH (ref 180–914)

## 2022-09-14 LAB — GLUCOSE, CAPILLARY: Glucose-Capillary: 111 mg/dL — ABNORMAL HIGH (ref 70–99)

## 2022-09-14 MED ORDER — ALBUTEROL SULFATE (2.5 MG/3ML) 0.083% IN NEBU: 2.5000 mg | INHALATION_SOLUTION | RESPIRATORY_TRACT | Status: DC | PRN

## 2022-09-14 MED ORDER — SODIUM CHLORIDE 0.9% IV SOLUTION: Freq: Once | INTRAVENOUS | Status: AC

## 2022-09-14 MED ORDER — AMLODIPINE BESYLATE 5 MG PO TABS
5.0000 mg | ORAL_TABLET | Freq: Every day | ORAL | Status: DC
  Administered 2022-09-14 – 2022-09-15 (×2): 5 mg via ORAL
  Filled 2022-09-14 (×2): qty 1

## 2022-09-14 MED ORDER — HYDRALAZINE HCL 20 MG/ML IJ SOLN: 5.0000 mg | Freq: Three times a day (TID) | INTRAMUSCULAR | Status: DC | PRN

## 2022-09-14 MED ORDER — PANTOPRAZOLE SODIUM 40 MG IV SOLR
40.0000 mg | INTRAVENOUS | Status: DC
  Administered 2022-09-14 – 2022-09-15 (×3): 40 mg via INTRAVENOUS
  Filled 2022-09-14 (×3): qty 10

## 2022-09-14 MED ORDER — SODIUM CHLORIDE 0.9 % IV SOLN
200.0000 mg | Freq: Every day | INTRAVENOUS | Status: AC
  Administered 2022-09-14 – 2022-09-15 (×2): 200 mg via INTRAVENOUS
  Filled 2022-09-14 (×2): qty 200

## 2022-09-14 MED ORDER — SODIUM CHLORIDE 0.9 % IV SOLN: INTRAVENOUS | Status: DC

## 2022-09-14 MED ORDER — ONDANSETRON HCL 4 MG/2ML IJ SOLN: 4.0000 mg | Freq: Four times a day (QID) | INTRAMUSCULAR | Status: DC | PRN

## 2022-09-14 MED ORDER — ACETAMINOPHEN 325 MG PO TABS: 650.0000 mg | ORAL_TABLET | Freq: Four times a day (QID) | ORAL | Status: DC | PRN

## 2022-09-14 MED ORDER — SODIUM CHLORIDE 0.9% IV SOLUTION
Freq: Once | INTRAVENOUS | Status: DC
  Filled 2022-09-14: qty 250

## 2022-09-14 MED ORDER — SODIUM CHLORIDE 0.9% FLUSH
3.0000 mL | Freq: Two times a day (BID) | INTRAVENOUS | Status: DC
  Administered 2022-09-14 – 2022-09-15 (×5): 3 mL via INTRAVENOUS

## 2022-09-14 MED ORDER — ONDANSETRON HCL 4 MG PO TABS: 4.0000 mg | ORAL_TABLET | Freq: Four times a day (QID) | ORAL | Status: DC | PRN

## 2022-09-14 MED ORDER — ORAL CARE MOUTH RINSE: 15.0000 mL | OROMUCOSAL | Status: DC | PRN

## 2022-09-14 MED ORDER — ACETAMINOPHEN 650 MG RE SUPP: 650.0000 mg | Freq: Four times a day (QID) | RECTAL | Status: DC | PRN

## 2022-09-14 MED ORDER — SENNOSIDES-DOCUSATE SODIUM 8.6-50 MG PO TABS: 1.0000 | ORAL_TABLET | Freq: Every evening | ORAL | Status: DC | PRN

## 2022-09-14 NOTE — Consult Note (Signed)
[name] [name] , MD [address], [place], [place], [place], [address] [address], [place], [place], [place], [address] Phone: [phone]  Fax: [phone]  Consultation  Referring Provider:   Dr Leslye Peer Primary Care Physician:  [facility] Primary Gastroenterologist:  Dr. Haig Prophet         Reason for Consultation:     anemia   Date of Admission:  09/05/2022 Date of Consultation:  09/14/2022         HPI:   [name] [name] is a 77 y.o. female who follows with [facility] clinic gastroenterology Dr. Haig Prophet as an outpatient carries a diagnosis of iron deficiency anemia, autoimmune hepatitis on Imuran.  She had been receiving outpatient oral iron.  There was concern for weight loss as well at the recent visit on 08/05/2021.  She was at [facility]ended EGD colonoscopy and a CT scan to evaluate for the same.  Reviewing his labs was low at 7.1 g 11 months back with an MCV of 90 that had improved to 10.2 g and MCV of 87.  In 2022 her ferritin was 6, MCV had been low.   She has a past medical history of hypertension DVT GERD gastritis hyperparathyroidism she was brought to the hospital from her skilled nursing facility noted to be tachycardic with a heart rate in the 150s and low blood pressure.  No overt blood loss mentioned.  On presentation her hemoglobin was 5.5 g with an MCV of 76, ferritin of 223, TIBC low iron low B12 is normal.  INR 4.1. She has been on Eliquis for bilateral DVTs being treated for RSV upper respite tract infection.  On admission he had an and had been elevated to 1.23 baseline 1.84.  She denies any blood in stool , nose bleeds or any blood loss, denies any abdominal pain , cough or sob .   Past Medical History:  Diagnosis Date   Abnormal LFTs (liver function tests)    Autoimmune hepatitis ([place]) 2018   Autoimmune hepatitis ([place])    Autoimmune hepatitis treated with steroids ([place])    DOE (dyspnea on exertion)    GERD (gastroesophageal reflux disease)    Hepatitis     Hyperlipidemia    Hypertension    Iron deficiency anemia due to chronic blood loss 05/27/2021   Osteoporosis     Past Surgical History:  Procedure Laterality Date   ABDOMINAL HYSTERECTOMY     COLONOSCOPY  10/06/2015   COLONOSCOPY WITH PROPOFOL N/A 08/27/2018   Procedure: COLONOSCOPY WITH PROPOFOL;  Surgeon: Manya Silvas, MD;  Location: [facility] ENDOSCOPY;  Service: Endoscopy;  Laterality: N/A;   ESOPHAGOGASTRODUODENOSCOPY (EGD) WITH PROPOFOL N/A 08/27/2018   Procedure: ESOPHAGOGASTRODUODENOSCOPY (EGD) WITH PROPOFOL;  Surgeon: Manya Silvas, MD;  Location: [facility] ENDOSCOPY;  Service: Endoscopy;  Laterality: N/A;   ESOPHAGOGASTRODUODENOSCOPY (EGD) WITH PROPOFOL N/A 05/21/2021   Procedure: ESOPHAGOGASTRODUODENOSCOPY (EGD) WITH PROPOFOL;  Surgeon: Lesly Rubenstein, MD;  Location: [facility] ENDOSCOPY;  Service: Endoscopy;  Laterality: N/A;   HIP ARTHROPLASTY Right 08/20/2021   Procedure: ARTHROPLASTY BIPOLAR HIP (HEMIARTHROPLASTY);  Surgeon: Thornton Park, MD;  Location: [facility] ORS;  Service: Orthopedics;  Laterality: Right;   LIVER BIOPSY     PERIPHERAL VASCULAR THROMBECTOMY Bilateral 11/04/2021   Procedure: PERIPHERAL VASCULAR THROMBECTOMY;  Surgeon: Algernon Huxley, MD;  Location: [place] CV LAB;  Service: Cardiovascular;  Laterality: Bilateral;   TOTAL HIP REVISION Right 09/18/2021   Procedure: FEMORAL COMPONENT REVISION CONVERSION TO TOTAL HIP ARTHROPLASTY;  Surgeon: Rod Can, MD;  Location: [facility] ORS;  Service: Orthopedics;  Laterality: Right;    Prior to Admission medications   Medication Sig Start Date End Date Taking? Authorizing Provider  Amino Acids-Protein Hydrolys (FEEDING SUPPLEMENT, PRO-STAT SUGAR FREE 64,) LIQD Take 30 mLs by mouth 3 (three) times daily with meals.   Yes [provider]  apixaban (ELIQUIS) 5 MG TABS tablet Take 2 tablets (10 mg total) by mouth 2 (two) times daily. From 11/12/2021 take 5 mg twice a day 11/08/21  Yes Fritzi Mandes, MD  ascorbic acid  (VITAMIN C) 500 MG tablet Take 500 mg by mouth daily.   Yes [provider]  azaTHIOprine (IMURAN) 50 MG tablet Take 25 mg by mouth daily. 09/22/17  Yes [provider]  calcium citrate-vitamin D (CITRACAL+D) 315-200 MG-UNIT tablet Take 1 tablet by mouth 2 (two) times daily.   Yes [provider]  cyanocobalamin 100 MCG tablet Take 100 mcg by mouth daily.   Yes [provider]  estradiol (ESTRACE) 0.1 MG/GM vaginal cream Place 1 Applicatorful vaginally 3 (three) times a week. 02/20/19  Yes [provider]  HYDROcodone-acetaminophen (NORCO/VICODIN) 5-325 MG tablet Take 1 tablet by mouth daily as needed. 11/08/21  Yes Fritzi Mandes, MD  melatonin 3 MG TABS tablet Take 3 mg by mouth at bedtime.   Yes [provider]  metoprolol tartrate (LOPRESSOR) 25 MG tablet Take 25 mg by mouth 2 (two) times daily.   Yes [provider]  Multiple Vitamin (MULTIVITAMIN WITH MINERALS) TABS tablet Take 1 tablet by mouth daily. 08/24/21  Yes Wieting, Richard, MD  pantoprazole (PROTONIX) 20 MG tablet Take 20 mg by mouth 2 (two) times daily.   Yes [provider]  potassium chloride (KLOR-CON M) 10 MEQ tablet Take 10 mEq by mouth daily.   Yes [provider]  predniSONE (DELTASONE) 5 MG tablet Take 7.5 mg by mouth daily. 09/17/17  Yes [provider]  sertraline (ZOLOFT) 25 MG tablet Take 25 mg by mouth daily.   Yes [provider]  acetaminophen (TYLENOL) 325 MG tablet Take 2 tablets (650 mg total) by mouth every 6 (six) hours as needed for mild pain or moderate pain (pain score 1-3 or temp > 100.5). 08/24/21   Loletha Grayer, MD  amLODipine (NORVASC) 5 MG tablet Take 5 mg by mouth daily. Patient not taking: Reported on 09/02/2022    [provider]  Calcium Carbonate Antacid 600 MG chewable tablet Chew 600 mg by mouth. Patient not taking: Reported on 08/29/2022    [provider]  feeding supplement (ENSURE ENLIVE /  ENSURE PLUS) LIQD Take 237 mLs by mouth 2 (two) times daily between meals. Patient not taking: Reported on 08/30/2022 08/24/21   Loletha Grayer, MD  Ferrous Fumarate (HEMOCYTE - 106 MG FE) 324 (106 Fe) MG TABS tablet Take 1 tablet by mouth daily. Patient not taking: Reported on 08/21/2022    [provider]  leptospermum manuka honey (MEDIHONEY) PSTE paste Apply 1 application. topically daily. Patient not taking: Reported on 09/12/2022 11/08/21   Fritzi Mandes, MD  omeprazole (PRILOSEC) 40 MG capsule Take 40 mg by mouth daily. Patient not taking: Reported on 09/06/2022 08/11/20   [provider]  polyethylene glycol (MIRALAX / GLYCOLAX) 17 g packet Take 17 g by mouth daily as needed for mild constipation. 08/24/21   Loletha Grayer, MD    Family History  Problem Relation Age of Onset   Cancer Mother      Social History   Tobacco Use  Smoking status: Never   Smokeless tobacco: Never  Vaping Use   Vaping Use: Never used  Substance Use Topics   Alcohol use: No   Drug use: Never    Allergies as of 08/30/2022 - Review Complete 09/04/2022  Allergen Reaction Noted   Sulfa antibiotics Nausea And Vomiting 02/20/2017   Sulfasalazine Nausea And Vomiting 08/24/2018    Review of Systems:    All systems reviewed and negative except where noted in HPI.   Physical Exam:  Vital signs in last 24 hours: Temp:  [97.6 F (36.4 C)-99.3 F (37.4 C)] 98.3 F (36.8 C) (02/28 0925) Pulse Rate:  [82-157] 87 (02/28 1121) Resp:  [16-41] 22 (02/28 0925) BP: (77-187)/(56-104) 187/104 (02/28 1141) SpO2:  [94 %-100 %] 96 % (02/28 0523) Weight:  [52.2 kg] 52.2 kg (02/27 2037) Last BM Date : 09/14/22 General:  appears very thin and cachectic  Head:  Normocephalic and atraumatic. Eyes:   No icterus.   Conjunctiva pink. PERRLA. Ears:  Normal auditory acuity. Neck:  Supple; no masses or thyroidomegaly Lungs: Respirations even and unlabored. Lungs clear to auscultation bilaterally.   No  wheezes, crackles, or rhonchi.  Heart:  Regular rate and rhythm;  Without murmur, clicks, rubs or gallops Abdomen:  Soft, nondistended, nontender. Normal bowel sounds. No appreciable masses or hepatomegaly.  No rebound or guarding.  Neurologic:  Alert and oriented x3;  grossly normal neurologically. Skin:  Intact without significant lesions or rashes. Cervical Nodes:  No significant cervical adenopathy. Psych:  Alert and cooperative. Normal affect.  LAB RESULTS: Recent Labs    08/23/2022 2045 09/14/22 0351 09/14/22 1043 09/14/22 1227  WBC 9.8 10.2  --  6.9  HGB 7.6* 5.5* 7.5* 7.6*  HCT 29.2* 20.5* 26.1* 26.3*  PLT 628* 307  --  254   BMET Recent Labs    09/10/2022 2045 09/14/22 0351  NA 137 140  K 4.3 3.6  CL 98 107  CO2 23 22  GLUCOSE 155* 124*  BUN 44* 48*  CREATININE 1.23* 1.01*  CALCIUM 11.2* 9.8   LFT Recent Labs    09/11/2022 2045 09/14/22 1237  PROT 7.7  --   ALBUMIN 3.5  --   AST 112*  --   ALT 53*  --   ALKPHOS 75  --   BILITOT 1.4* 1.3*  BILIDIR  --  0.2  IBILI  --  1.1*   PT/INR Recent Labs    09/10/2022 2130  LABPROT 39.7*  INR 4.1*    STUDIES: DG Chest Port 1 View  Result Date: 09/05/2022 CLINICAL DATA:  Sepsis, history of hepatitis EXAM: PORTABLE CHEST 1 VIEW COMPARISON:  10/14/2021 FINDINGS: Single frontal view of the chest demonstrates stable enlargement of the cardiac silhouette. Continued hiatal hernia. No airspace disease, effusion, or pneumothorax. No acute bony abnormality. IMPRESSION: 1. No acute intrathoracic process. 2. Stable hiatal hernia. Electronically Signed   By: Randa Ngo M.D.   On: 09/09/2022 21:18      Impression / Plan:   Cordelia S Mulcahey is a 77 y.o. y/o female with history of bilateral DVT, hyperparathyroidism, autoimmune hepatitis on Imuran, right hip fracture, CKD stage IIIa thrombocytopenia iron deficiency anemia follows with Dr. Haig Prophet at [facility] clinic gastroenterology.  Recent visit concern for unintentional  weight loss the plan was for an EGD colonoscopy and imaging of the chest abdomen pelvis to rule out malignancy.  Presently the hemoglobin on admission was 5 g with a very low MCV but iron studies were not completely compatible  with p.o. iron deficiency anemia my concern is if there is a mixed picture of a competent of iron deficiency anemia an element of bone marrow suppression from And in view of weight loss should be be more concerned about condition such as lymphoma.  Plan 1.  Monitor CBC transfuse as needed 2.  She may require GI evaluation with EGD colonoscopy once her INR is less than 2 and she has been off Eliquis for at least 3 days in view of elevated creatinine echo out of her system and provided her anemia cannot be explained with any other reason. With she being RSV positive wouldn't rush into any procedure with anesthesia unless needed for sure. 3.  In view of weight loss and history of being on Imuran consider CT chest abdomen pelvis to rule out malignancy 4.  Would recommend hematology input to help interpret her microcytic anemia with acute drop, no overt blood loss with iron studies not completely compatible with iron deficiency anemia.  Thank you for involving me in the care of this patient.      LOS: 0 days   [name] Bellows, MD  09/14/2022, 2:39 PM

## 2022-09-14 NOTE — Assessment & Plan Note (Addendum)
Imuran was held with low blood counts.  The patient was on prednisone.

## 2022-09-14 NOTE — Consult Note (Addendum)
Hematology/Oncology Consult note [phone] [phone]    REASON FOR COSULTATION:  Anemia  History of presenting illness-  77 y.o. female with PMH listed at below who was sent to ER for evaluation of tachycardia with heart rate of 150s, low blood pressure.  Patient was confused initially not able to give appropriate history. Initial blood work showed hemoglobin was 7.6 which further trended down to 5.5 after hydration.  Iron panel showed a decreased iron saturation 5%, TIBC 249, ferritin 223, adequate vitamin B12 and folate level. MCV 76.5.  Patient has history of chronic anemia, hemoglobin in April 2023 was 10.2.  Creatinine 1.23 she has CKD calcium 11.2, AST 112, ALT 53, total bilirubin 1.4. She also admits SIRS criteria.  RSV positive, COVID-19, influenza negative.  UA showed negative nitrates, negative leukocytes.  Blood culture no growth in 12 hours.  Lactic acid 5.9 Patient has a history of autoimmune hepatitis, previously on azathioprine and prednisone. Hematology was consulted for further evaluation.  Patient is a poor historian.resides at [facility]  Allergies  Allergen Reactions   Sulfa Antibiotics Nausea And Vomiting    Pt has not had a sulfa drug in years and is not certain of reaction but suspects nausea and vomiting.   Sulfasalazine Nausea And Vomiting    Patient Active Problem List   Diagnosis Date Noted   Anemia due to acute blood loss 09/14/2022   DVT (deep venous thrombosis) ([place]) 11/03/2021   Seizure ([place]) 09/20/2021   Pressure injury of skin 09/19/2021   Periprosthetic intertrochanteric fracture of femur 09/18/2021   Closed right hip fracture ([place]) 09/18/2021   Normocytic anemia 09/18/2021   Acute urinary retention 08/22/2021   Acute postoperative anemia due to expected blood loss 08/22/2021   Thrombocytopenia ([place]) 08/22/2021   Acute kidney injury superimposed on CKD ([place]) 08/21/2021   Toenail fungus 08/21/2021   Hyponatremia  08/21/2021   Nausea 08/20/2021   Hypertension    GERD (gastroesophageal reflux disease)    Stage 3a chronic kidney disease (CKD) ([place])    Closed hip fracture requiring operative repair, right, sequela 08/19/2021   Autoimmune hepatitis ([place]) 08/19/2021   Iron deficiency anemia due to chronic blood loss 05/27/2021   Hyperparathyroidism due to renal insufficiency ([place]) 07/08/2019   Erosive gastritis 12/25/2018   Abnormal LFTs 02/20/2017   Senile osteoporosis 02/13/2017     Past Medical History:  Diagnosis Date   Abnormal LFTs (liver function tests)    Autoimmune hepatitis ([place]) 2018   Autoimmune hepatitis ([place])    Autoimmune hepatitis treated with steroids ([place])    DOE (dyspnea on exertion)    GERD (gastroesophageal reflux disease)    Hepatitis    Hyperlipidemia    Hypertension    Iron deficiency anemia due to chronic blood loss 05/27/2021   Osteoporosis      Past Surgical History:  Procedure Laterality Date   ABDOMINAL HYSTERECTOMY     COLONOSCOPY  10/06/2015   COLONOSCOPY WITH PROPOFOL N/A 08/27/2018   Procedure: COLONOSCOPY WITH PROPOFOL;  Surgeon: Manya Silvas, MD;  Location: [facility] ENDOSCOPY;  Service: Endoscopy;  Laterality: N/A;   ESOPHAGOGASTRODUODENOSCOPY (EGD) WITH PROPOFOL N/A 08/27/2018   Procedure: ESOPHAGOGASTRODUODENOSCOPY (EGD) WITH PROPOFOL;  Surgeon: Manya Silvas, MD;  Location: [facility] ENDOSCOPY;  Service: Endoscopy;  Laterality: N/A;   ESOPHAGOGASTRODUODENOSCOPY (EGD) WITH PROPOFOL N/A 05/21/2021   Procedure: ESOPHAGOGASTRODUODENOSCOPY (EGD) WITH PROPOFOL;  Surgeon: Lesly Rubenstein, MD;  Location: [facility] ENDOSCOPY;  Service: Endoscopy;  Laterality: N/A;   HIP ARTHROPLASTY Right 08/20/2021  Procedure: ARTHROPLASTY BIPOLAR HIP (HEMIARTHROPLASTY);  Surgeon: Thornton Park, MD;  Location: [facility] ORS;  Service: Orthopedics;  Laterality: Right;   LIVER BIOPSY     PERIPHERAL VASCULAR THROMBECTOMY Bilateral 11/04/2021   Procedure: PERIPHERAL VASCULAR  THROMBECTOMY;  Surgeon: Algernon Huxley, MD;  Location: [facility];  Service: Cardiovascular;  Laterality: Bilateral;   TOTAL HIP REVISION Right 09/18/2021   Procedure: FEMORAL COMPONENT REVISION CONVERSION TO TOTAL HIP ARTHROPLASTY;  Surgeon: Rod Can, MD;  Location: [facility] ORS;  Service: Orthopedics;  Laterality: Right;    Social History   Socioeconomic History   Marital status: Married    Spouse name: Not on file   Number of children: Not on file   Years of education: Not on file   Highest education level: Not on file  Occupational History   Not on file  Tobacco Use   Smoking status: Never   Smokeless tobacco: Never  Vaping Use   Vaping Use: Never used  Substance and Sexual Activity   Alcohol use: No   Drug use: Never   Sexual activity: Not Currently    Birth control/protection: Post-menopausal  Other Topics Concern   Not on file  Social History Narrative   Not on file   Social Determinants of Health   Financial Resource Strain: Not on file  Food Insecurity: Not on file  Transportation Needs: Not on file  Physical Activity: Not on file  Stress: Not on file  Social Connections: Not on file  Intimate Partner Violence: Not on file     Family History  Problem Relation Age of Onset   Cancer Mother      Current Facility-Administered Medications:    0.9 %  sodium chloride infusion (Manually program via Guardrails IV Fluids), , Intravenous, Once, Acheampong, Warnell Bureau, MD   0.9 %  sodium chloride infusion, , Intravenous, Continuous, Acheampong, Warnell Bureau, MD, Paused at 09/14/22 [phone]   acetaminophen (TYLENOL) tablet 650 mg, 650 mg, Oral, Q6H PRN **OR** acetaminophen (TYLENOL) suppository 650 mg, 650 mg, Rectal, Q6H PRN, Acheampong, Warnell Bureau, MD   albuterol (PROVENTIL) (2.5 MG/3ML) 0.083% nebulizer solution 2.5 mg, 2.5 mg, Nebulization, Q2H PRN, Acheampong, Warnell Bureau, MD   azaTHIOprine (IMURAN) tablet 25 mg, 25 mg, Oral, Daily, Acheampong, Warnell Bureau, MD, 25 mg at 09/14/22  1036   cefTRIAXone (ROCEPHIN) 2 g in sodium chloride 0.9 % 100 mL IVPB, 2 g, Intravenous, Q24H, Carrie Mew, MD, Stopped at 09/01/2022 2114   hydrALAZINE (APRESOLINE) injection 5 mg, 5 mg, Intravenous, Q8H PRN, Acheampong, Warnell Bureau, MD   melatonin tablet 5 mg, 5 mg, Oral, QHS, Acheampong, Warnell Bureau, MD, 5 mg at 09/14/22 0106   metoprolol tartrate (LOPRESSOR) tablet 25 mg, 25 mg, Oral, BID, Acheampong, Warnell Bureau, MD, 25 mg at 09/14/22 1034   ondansetron (ZOFRAN) tablet 4 mg, 4 mg, Oral, Q6H PRN **OR** ondansetron (ZOFRAN) injection 4 mg, 4 mg, Intravenous, Q6H PRN, Acheampong, Warnell Bureau, MD   Oral care mouth rinse, 15 mL, Mouth Rinse, PRN, Acheampong, Warnell Bureau, MD   pantoprazole (PROTONIX) injection 40 mg, 40 mg, Intravenous, Q24H, Acheampong, Warnell Bureau, MD, 40 mg at 09/14/22 0109   predniSONE (DELTASONE) tablet 7.5 mg, 7.5 mg, Oral, Daily, Acheampong, Warnell Bureau, MD, 7.5 mg at 09/14/22 1035   senna-docusate (Senokot-S) tablet 1 tablet, 1 tablet, Oral, QHS PRN, Acheampong, Warnell Bureau, MD   sertraline (ZOLOFT) tablet 25 mg, 25 mg, Oral, Daily, Acheampong, Warnell Bureau, MD, 25 mg at 09/14/22 1034   sodium chloride flush (NS)  0.9 % injection 3 mL, 3 mL, Intravenous, Q12H, Acheampong, Warnell Bureau, MD, 3 mL at 09/14/22 1040  Review of Systems  Unable to perform ROS: Mental status change  Constitutional:  Positive for fatigue.  Respiratory:  Negative for shortness of breath.   Cardiovascular:  Negative for chest pain.  Gastrointestinal:  Negative for abdominal distention, abdominal pain and blood in stool.  Genitourinary:  Negative for difficulty urinating.   Musculoskeletal:  Negative for arthralgias.  Skin:  Negative for rash and wound.  Neurological:  Negative for light-headedness.  Hematological:  Does not bruise/bleed easily.    PHYSICAL EXAM Vitals:   09/14/22 0451 09/14/22 0523 09/14/22 0545 09/14/22 0925  BP: (!) 148/82 (!) 154/87 (!) 160/100 (!) 178/100  Pulse: 88 88 89 82  Resp: '16 20 20 '$ (!) 22  Temp:  98.3 F (36.8 C) 98.1 F (36.7 C) 98.3 F (36.8 C) 98.3 F (36.8 C)  TempSrc:      SpO2: 97% 96%    Weight:      Height:       Physical Exam Constitutional:      General: She is not in acute distress.    Appearance: She is ill-appearing. She is not diaphoretic.  HENT:     Head: Normocephalic and atraumatic.     Nose: Nose normal.     Mouth/Throat:     Pharynx: No oropharyngeal exudate.  Eyes:     General: No scleral icterus.    Pupils: Pupils are equal, round, and reactive to light.  Cardiovascular:     Rate and Rhythm: Normal rate.  Pulmonary:     Effort: Pulmonary effort is normal. No respiratory distress.     Breath sounds: No wheezing.  Abdominal:     General: Bowel sounds are normal. There is no distension.     Palpations: Abdomen is soft.     Tenderness: There is no abdominal tenderness.  Musculoskeletal:        General: Normal range of motion.     Cervical back: Normal range of motion.  Skin:    General: Skin is warm and dry.     Findings: No erythema.  Neurological:     Mental Status: She is alert.     Cranial Nerves: No cranial nerve deficit.     Motor: No abnormal muscle tone.  Psychiatric:        Mood and Affect: Mood and affect normal.       LABORATORY STUDIES    Latest Ref Rng & Units 09/14/2022    3:51 AM 09/09/2022    8:45 PM 11/08/2021    4:46 AM  CBC  WBC 4.0 - 10.5 K/uL 10.2  9.8  8.2   Hemoglobin 12.0 - 15.0 g/dL 5.5  7.6  10.2   Hematocrit 36.0 - 46.0 % 20.5  29.2  32.7   Platelets 150 - 400 K/uL 307  628  206       Latest Ref Rng & Units 09/14/2022    3:51 AM 09/06/2022    8:45 PM 11/04/2021    5:47 AM  CMP  Glucose 70 - 99 mg/dL 124  155  85   BUN 8 - 23 mg/dL 48  44  55   Creatinine 0.44 - 1.00 mg/dL 1.01  1.23  1.23   Sodium 135 - 145 mmol/L 140  137  141   Potassium 3.5 - 5.1 mmol/L 3.6  4.3  3.7   Chloride 98 - 111 mmol/L 107  98  110   CO2 22 - 32 mmol/L '22  23  24   '$ Calcium 8.9 - 10.3 mg/dL 9.8  11.2  9.1   Total Protein  6.5 - 8.1 g/dL  7.7    Total Bilirubin 0.3 - 1.2 mg/dL  1.4    Alkaline Phos 38 - 126 U/L  75    AST 15 - 41 U/L  112    ALT 0 - 44 U/L  53       RADIOGRAPHIC STUDIES: I have personally reviewed the radiological images as listed and agreed with the findings in the report. DG Chest Port 1 View  Result Date: 09/04/2022 CLINICAL DATA:  Sepsis, history of hepatitis EXAM: PORTABLE CHEST 1 VIEW COMPARISON:  10/14/2021 FINDINGS: Single frontal view of the chest demonstrates stable enlargement of the cardiac silhouette. Continued hiatal hernia. No airspace disease, effusion, or pneumothorax. No acute bony abnormality. IMPRESSION: 1. No acute intrathoracic process. 2. Stable hiatal hernia. Electronically Signed   By: Randa Ngo M.D.   On: 08/28/2022 21:18     Assessment and plan-   Acute on chronic anemia, symptomatic Agree with PRBC transfusion to keep hemoglobin above 7.  MCV is 76.5, she has a history of iron deficiency anemia. Iron panel is not helpful iron deficiency.  Decreased iron saturation, low TIBC.  Ferritin may be falsely elevated due to acute RSV infection as well as her underlying autoimmune disease..  There is also a component of anemia secondary to chronic disease.-CKD Check reticulocyte hemoglobin level, haptoglobin, LDH, smear, myeloma panel, light chain ratio, beta-2 microglobulin, fractionated bilirubin, differential Patient is on Imuran for autoimmune hepatitis.  I discussed with Dr. Vicente Males, given her cytopenia, okay to hold off Imuran avoid potential bone marrow suppression.  Keep patient on prednisone.  Hypercalcemia, could be secondary to dehydration.  Check PTH, PTHrp. RSV infection, supportive care Lactic acidosis, etiology unclear. History of bilateral DVT, Eliquis on hold Transaminitis, could be secondary to autoimmune hepatitis or reactive due to acute infection.  Close monitor.  Thank you for allowing me to participate in the care of this patient.   Earlie Server, MD,  PhD Hematology Oncology 09/14/2022   Addendum, reticulocyte hemoglobin is severely decreased, this is consistent with iron deficiency.  Ferritin may be elevated falsely due to acute infection/inflammation process.  In addition, in the context of chronic kidney disease, I recommend to further improve her iron stores.  I will start patient with IV Venofer treatments  Earlie Server

## 2022-09-14 NOTE — Assessment & Plan Note (Addendum)
Patient was treated with metoprolol and Norvasc was added secondary to high blood pressure.

## 2022-09-14 NOTE — Assessment & Plan Note (Addendum)
Her last creatinine was 1.01.

## 2022-09-14 NOTE — H&P (Addendum)
History and Physical    Patient: [name] [name] [id number] DOB: Aug 20, 1945 DOA: 09/09/2022 DOS: the patient was seen and examined on 09/14/2022 PCP: [place]  Patient coming from: ALF/ILF  Chief Complaint:  Chief Complaint  Patient presents with   Hypotension        HPI:  Patient is a poor historian.  History mostly obtained from records provided by ED physician and nursing team.  [name] [name] is a 77 y.o. female with medical history significant for multiple medical problems that include HTN, h/o DVT, GERD, h/o erosive gastritis, autoimmune hepatitis, hyperparathyroidism, osteoporosis, h/o right hip fx, stage 3a CKD, urinary retention, h/o thrombocytopenia, normocytic anemia, IDA, seizures. [name] [name] resides at [facility] and is partially ADL and IADL dependent.  At time of my evaluation, patient seems confused and unable to give any appropriate history.  She understands she is in the hospital but does not know how and why she is in the hospital.  She was brought in from her skilled facility after patient was noted to be tachycardic with heart rate in the 150s and blood pressure of 70/50.  No witnessed tonic-clonic seizures.  No reported hematemesis or hematochezia.  Patient is unable to state if she has had any dark tarry stools lately.  Patient was noted to be pale and intravascularly dry on presentation.  Labs came back showing abnormal hemoglobin multiple at 10 abnormalities including hypocalcemia.  Review of Systems: unable to review all systems due to the inability of the patient to answer questions. Past Medical History:  Diagnosis Date   Abnormal LFTs (liver function tests)    Autoimmune hepatitis ([place]) 2018   Autoimmune hepatitis ([place])    Autoimmune hepatitis treated with steroids ([place])    DOE (dyspnea on exertion)    GERD (gastroesophageal reflux disease)    Hepatitis    Hyperlipidemia    Hypertension    Iron deficiency anemia due to chronic blood  loss 05/27/2021   Osteoporosis    Past Surgical History:  Procedure Laterality Date   ABDOMINAL HYSTERECTOMY     COLONOSCOPY  10/06/2015   COLONOSCOPY WITH PROPOFOL N/A 08/27/2018   Procedure: COLONOSCOPY WITH PROPOFOL;  Surgeon: Manya Silvas, MD;  Location: [facility] ENDOSCOPY;  Service: Endoscopy;  Laterality: N/A;   ESOPHAGOGASTRODUODENOSCOPY (EGD) WITH PROPOFOL N/A 08/27/2018   Procedure: ESOPHAGOGASTRODUODENOSCOPY (EGD) WITH PROPOFOL;  Surgeon: Manya Silvas, MD;  [facility] ENDOSCOPY;  Service: Endoscopy;  Laterality: N/A;   ESOPHAGOGASTRODUODENOSCOPY (EGD) WITH PROPOFOL N/A 05/21/2021   Procedure: ESOPHAGOGASTRODUODENOSCOPY (EGD) WITH PROPOFOL;  Surgeon: Lesly Rubenstein, MD;  Location: [facility] ENDOSCOPY;  Service: Endoscopy;  Laterality: N/A;   HIP ARTHROPLASTY Right 08/20/2021   Procedure: ARTHROPLASTY BIPOLAR HIP (HEMIARTHROPLASTY);  Surgeon: Thornton Park, MD;  Location: [facility] ORS;  Service: Orthopedics;  Laterality: Right;   LIVER BIOPSY     PERIPHERAL VASCULAR THROMBECTOMY Bilateral 11/04/2021   Procedure: PERIPHERAL VASCULAR THROMBECTOMY;  Surgeon: Algernon Huxley, MD;  Location: [facility] LAB;  Service: Cardiovascular;  Laterality: Bilateral;   TOTAL HIP REVISION Right 09/18/2021   Procedure: FEMORAL COMPONENT REVISION CONVERSION TO TOTAL HIP ARTHROPLASTY;  Surgeon: Rod Can, MD;  Location: [facility] ORS;  Service: Orthopedics;  Laterality: Right;   Social History:  reports that she has never smoked. She has never used smokeless tobacco. She reports that she does not drink alcohol and does not use drugs.  Allergies  Allergen Reactions   Sulfa Antibiotics Nausea And Vomiting    Pt has not had a  sulfa drug in years and is not certain of reaction but suspects nausea and vomiting.   Sulfasalazine Nausea And Vomiting    Family History  Problem Relation Age of Onset   Cancer Mother     Prior to Admission medications   Medication Sig Start Date End Date Taking?  Authorizing Provider  Amino Acids-Protein Hydrolys (FEEDING SUPPLEMENT, PRO-STAT SUGAR FREE 64,) LIQD Take 30 mLs by mouth 3 (three) times daily with meals.   Yes [provider]  apixaban (ELIQUIS) 5 MG TABS tablet Take 2 tablets (10 mg total) by mouth 2 (two) times daily. From 11/12/2021 take 5 mg twice a day 11/08/21  Yes Fritzi Mandes, MD  ascorbic acid (VITAMIN C) 500 MG tablet Take 500 mg by mouth daily.   Yes [provider]  azaTHIOprine (IMURAN) 50 MG tablet Take 25 mg by mouth daily. 09/22/17  Yes [provider]  calcium citrate-vitamin D (CITRACAL+D) 315-200 MG-UNIT tablet Take 1 tablet by mouth 2 (two) times daily.   Yes [provider]  cyanocobalamin 100 MCG tablet Take 100 mcg by mouth daily.   Yes [provider]  estradiol (ESTRACE) 0.1 MG/GM vaginal cream Place 1 Applicatorful vaginally 3 (three) times a week. 02/20/19  Yes [provider]  HYDROcodone-acetaminophen (NORCO/VICODIN) 5-325 MG tablet Take 1 tablet by mouth daily as needed. 11/08/21  Yes Fritzi Mandes, MD  melatonin 3 MG TABS tablet Take 3 mg by mouth at bedtime.   Yes [provider]  metoprolol tartrate (LOPRESSOR) 25 MG tablet Take 25 mg by mouth 2 (two) times daily.   Yes [provider]  Multiple Vitamin (MULTIVITAMIN WITH MINERALS) TABS tablet Take 1 tablet by mouth daily. 08/24/21  Yes Wieting, Richard, MD  pantoprazole (PROTONIX) 20 MG tablet Take 20 mg by mouth 2 (two) times daily.   Yes [provider]  potassium chloride (KLOR-CON M) 10 MEQ tablet Take 10 mEq by mouth daily.   Yes [provider]  predniSONE (DELTASONE) 5 MG tablet Take 7.5 mg by mouth daily. 09/17/17  Yes [provider]  sertraline (ZOLOFT) 25 MG tablet Take 25 mg by mouth daily.   Yes [provider]  acetaminophen (TYLENOL) 325 MG tablet Take 2 tablets (650 mg total) by mouth every 6 (six) hours as needed for mild pain or moderate pain (pain  score 1-3 or temp > 100.5). 08/24/21   Loletha Grayer, MD  amLODipine (NORVASC) 5 MG tablet Take 5 mg by mouth daily. Patient not taking: Reported on 09/01/2022    [provider]  Calcium Carbonate Antacid 600 MG chewable tablet Chew 600 mg by mouth. Patient not taking: Reported on 09/04/2022    [provider]  feeding supplement (ENSURE ENLIVE / ENSURE PLUS) LIQD Take 237 mLs by mouth 2 (two) times daily between meals. Patient not taking: Reported on 08/26/2022 08/24/21   Loletha Grayer, MD  Ferrous Fumarate (HEMOCYTE - 106 MG FE) 324 (106 Fe) MG TABS tablet Take 1 tablet by mouth daily. Patient not taking: Reported on 09/04/2022    [provider]  leptospermum manuka honey (MEDIHONEY) PSTE paste Apply 1 application. topically daily. Patient not taking: Reported on 08/27/2022 11/08/21   Fritzi Mandes, MD  omeprazole (PRILOSEC) 40 MG capsule Take 40 mg by mouth daily. Patient not taking: Reported on 08/23/2022 08/11/20   [provider]  polyethylene glycol (MIRALAX / GLYCOLAX) 17 g packet Take 17 g by mouth daily as needed for mild constipation. 08/24/21   [place],  Richard, MD    Physical Exam: Vitals:   08/25/2022 2200 09/12/2022 2210 08/26/2022 2330 09/04/2022 2350  BP: (!) 156/82 (!) 165/90 (!) 152/89 (!) 156/94  Pulse:    96  Resp: (!) 34 (!) 29 (!) 30 (!) 28  Temp:      TempSrc:      SpO2:  98%  100%  Weight:      Height:       Patient is a frail elderly female who was seen laying comfortably in bed.  Not in any acute distress.  She looks pale and dehydrated.  She is alert oriented to self and place but lacks insight into why she is in the hospital. HEENT: Oral mucosa dry.  Difficulty assessing oral pharynx..  Difficult to assess in the oral pharynx. Neck supple with no palpable masses.   Chest: Clinically diminished bilaterally with no added sounds. Abdomen: Soft nontender bowel sounds present. Extremities: Without pedal edema Gait could not be assessed at  this time. CNS: No obvious focal deficits could be appreciated at this time.  Limited cranial nerve exam due to patient's condition. Data Reviewed:  Serum sodium is 137, potassium 4.3, chloride 98, bicarb 23, BUN 44, creatinine 1.23, calcium 11.2, anion gap 16 AST 112, ALT 53 total bili 1.4, lactic acid was 5.9 >>3.2, hemoglobin 7.6, hematocrit 29.2, MCV 78, platelet count 628, WBC 9.8 INR 4.1.  Respiratory panel shows RSV to be positive.  Influenza and COVID-negative.  Urinalysis shows 6-10 WBC.  Urine leukocyte esterase negative.  Chest x-ray: No acute intracranial thoracic process. Assessment and Plan: 77 y/o woman with multiple medical comorbidities that include Dvt on chronic anticoagulation with eliquis. Presents on account of Hypotension, tachycardia , acute anemia and elevated lactic acidosis  Acute symptomatic anemia: Likely blood loss anemia.  Precipitous drop in hemoglobin since April 2023.  MCV has significantly dropped from 87->78.  Serial H&H monitoring.  Transfuse PRBC. HB less than 8.  Protonix IV has been initiated.  Stool for occult has been sent and pending.Gastroenterology consulted to eval for further workup  RSV URTI- with SIRS criteria. Chest xray was negative. Patient is not requiring any oxygen supplements. Guaifenesin will be ordered.   History of bilateral DVT on anticoagulation with Eliquis.  Status post catheter directed thrombolysis in the past.Hold Eliquis due to acute anemia  Acute encephalopathy: Unclear if this could be postictal versus due to symptomatic anemia. Documented history of seizure disorder but med rec not suggestive patient has been on AED's. Patient also noted to be significantly dehydrated on presentation.  Consider PRBC transfusion.  Elevated lactic acidosis: Etiology unclear. Likely due to poor perfusion form anemia. No obvious source of infection. Dose of Rocephin was given in the ED. Will hold off on antibiotics for now pending  cultures.  Hypercalcemia: Likely due to dehydration.  Monitor serum calcium. Hold Calcium tablets for now.Consider ionized calcium if calcium remains elevated after hydration  Chronic kidney disease: Renal function remained stable.  Elevated transaminases: Likely due to autoimmune hepatitis.  Monitor for now.  Hypertension: Blood pressure stable.  On presentation, patient was noted to be hypotensive but responded to IV fluids.    Advance Care Planning:   Code Status: Full Code   Consults: Gastroenterology consult  Family Communication:   Severity of Illness: The appropriate patient status for this patient is INPATIENT. Inpatient status is judged to be reasonable and necessary in order to provide the required intensity of service to ensure the patient's safety. The patient's presenting  symptoms, physical exam findings, and initial radiographic and laboratory data in the context of their chronic comorbidities is felt to place them at high risk for further clinical deterioration. Furthermore, it is not anticipated that the patient will be medically stable for discharge from the hospital within 2 midnights of admission.   * I certify that at the point of admission it is my clinical judgment that the patient will require inpatient hospital care spanning beyond 2 midnights from the point of admission due to high intensity of service, high risk for further deterioration and high frequency of surveillance required.*  Author: Artist Beach, MD 09/14/2022 12:14 AM  For on call review [email or web address].

## 2022-09-14 NOTE — Assessment & Plan Note (Addendum)
Eliquis was on hold secondary to severe anemia. Repeat sonogram of the lower extremity shows right DVT nonocclusive.

## 2022-09-14 NOTE — Progress Notes (Signed)
PT Cancellation Note  Patient Details Name: [name] [name] MRN: [id number] DOB: June 07, 1946   Cancelled Treatment:    Reason Eval/Treat Not Completed: Medical issues which prohibited therapy Hemoglobin 5.5 this AM, had transfusion but still below 8; plan for another bag this afternoon.  Will maintain on caseload and will likely initiate PT tomorrow AM per appropriateness/medical status.    Kreg Shropshire, DPT 09/14/2022, 12:24 PM

## 2022-09-14 NOTE — Assessment & Plan Note (Signed)
Received IV fluids on admission

## 2022-09-14 NOTE — Assessment & Plan Note (Deleted)
Hemoglobin dropped down to 5.5 with IV fluid hydration.  Discontinue IV fluid hydration.  Received 1 unit of packed red blood cells and hemoglobin came up to 7.5.  Will give another unit of packed red blood cells.  Iron studies consistent with anemia of chronic disease.  Appreciate hematology consultation.  Holding Eliquis.

## 2022-09-14 NOTE — Assessment & Plan Note (Signed)
Likely with autoimmune hepatitis

## 2022-09-14 NOTE — Progress Notes (Signed)
OT Cancellation Note  Patient Details Name: [name] [name] MRN: [id number] DOB: [birth year]   Cancelled Treatment:    Reason Eval/Treat Not Completed: Medical issues which prohibited therapy. OT orders received, chart reviewed. Pt's hemoglobin 5.5 this am. After transfusion improved to 7.6. Per chart, pt planning to receive another transfusion this afternoon. INR at 4.1. Will re-attempt evaluation tomorrow.  Lanelle Bal  [facility] 09/14/2022, 2:07 PM

## 2022-09-14 NOTE — ED Notes (Signed)
Care hand off given to inpatient RN. Patient awake, alert at time of transport. Patient with respirations even, NAD at time of transport. Transported off the floor by Raquel Sarna, Therapist, sports.

## 2022-09-14 NOTE — TOC Initial Note (Signed)
Transition of Care [facility]) - Initial/Assessment Note    Patient Details  Name: [name] [name] MRN: [id number] Date of Birth: 22-Aug-1945  Transition of Care [facility]) CM/SW Contact:    Candie Chroman, LCSW Phone Number: 09/14/2022, 10:35 AM  Clinical Narrative:   Patient is not fully oriented. No supports at bedside. CSW called husband, introduced role, and explained that discharge planning would be discussed. Husband confirmed she is a long-term resident at [facility] but he does not want her to return there. Husband with concerns that she just lays in bed all day and does not get rehab. Explained that unless need for therapy to improve is indicated, insurance will not pay for therapy and this would be the case at any SNF she goes to. He was paying privately for her to be at [facility]. Husband wants to bring her home at discharge. Offered to make [employer] referral to assist with setting up personal care services but he said there are two ladies in their neighborhood that provide those services so he will talk with them. MD and RN are aware. No further concerns. CSW encouraged patient's husband to contact CSW as needed. CSW will continue to follow patient and her husband for support and facilitate discharge home once medically stable.               Expected Discharge Plan: [place] Barriers to Discharge: Continued Medical Work up   Patient Goals and CMS Choice            Expected Discharge Plan and Services     Post Acute Care Choice: NA Living arrangements for the past 2 months: [place]                                      Prior Living Arrangements/Services Living arrangements for the past 2 months: [place] Lives with:: Facility Resident Patient language and need for interpreter reviewed:: Yes        Need for Family Participation in Patient Care: Yes (Comment) Care giver support system in place?: Yes (comment)    Criminal Activity/Legal Involvement Pertinent to Current Situation/Hospitalization: No - Comment as needed  Activities of Daily Living      Permission Sought/Granted Permission sought to share information with : Family Supports    Share Information with NAME: Abida Murton     Permission granted to share info w Relationship: Husband  Permission granted to share info w Contact Information: [phone]  Emotional Assessment Appearance:: Appears stated age Attitude/Demeanor/Rapport: Unable to Assess Affect (typically observed): Unable to Assess Orientation: : Oriented to Self, Oriented to Place Alcohol / Substance Use: Not Applicable Psych Involvement: No (comment)  Admission diagnosis:  Anemia due to acute blood loss [D62] Atrial fibrillation with RVR (HCC) [I48.91] RSV infection [B33.8] Septic shock (HCC) [A41.9, R65.21] Patient Active Problem List   Diagnosis Date Noted   Anemia due to acute blood loss 09/14/2022   DVT (deep venous thrombosis) ([place]) 11/03/2021   Seizure ([place]) 09/20/2021   Pressure injury of skin 09/19/2021   Periprosthetic intertrochanteric fracture of femur 09/18/2021   Closed right hip fracture ([place]) 09/18/2021   Normocytic anemia 09/18/2021   Acute urinary retention 08/22/2021   Acute postoperative anemia due to expected blood loss 08/22/2021   Thrombocytopenia ([place]) 08/22/2021   Acute kidney injury superimposed on CKD ([place]) 08/21/2021   Toenail fungus 08/21/2021  Hyponatremia 08/21/2021   Nausea 08/20/2021   Hypertension    GERD (gastroesophageal reflux disease)    Stage 3a chronic kidney disease (CKD) (HCC)    Closed hip fracture requiring operative repair, right, sequela 08/19/2021   Autoimmune hepatitis ([place]) 08/19/2021   Iron deficiency anemia due to chronic blood loss 05/27/2021   Hyperparathyroidism due to renal insufficiency ([place]) 07/08/2019   Erosive gastritis 12/25/2018   Abnormal LFTs 02/20/2017   Senile osteoporosis 02/13/2017    PCP:  [facility] [facility]:   [id number]: [phone]: [phone]    Social Determinants of Health (SDOH) Social History: SDOH Screenings   Tobacco Use: Low Risk  (08/10/2022)   SDOH Interventions:     Readmission Risk Interventions     No data to display

## 2022-09-14 NOTE — Hospital Course (Addendum)
77 y.o. female with medical history significant for multiple medical problems that include HTN, h/o DVT, GERD, h/o erosive gastritis, autoimmune hepatitis, hyperparathyroidism, osteoporosis, h/o right hip fx, stage 3a CKD, urinary retention, h/o thrombocytopenia, normocytic anemia, IDA, seizures. [name] [name] resides at [facility] and is partially ADL and IADL dependent.   The patient seems confused and unable to give any appropriate history.  She understands she is in the hospital but does not know how and why she is in the hospital.   She was brought in from her skilled facility after patient was noted to be tachycardic with heart rate in the 150s and blood pressure of 70/50.  No witnessed tonic-clonic seizures.  No reported hematemesis or hematochezia.  Patient is unable to state if she has had any dark tarry stools lately.  Patient was noted to be pale and intravascularly dry on presentation.  Labs came back showing abnormal hemoglobin multiple at 10 abnormalities including hypocalcemia.  2/28.  Hemoglobin dropped down to 5.5 with IV fluid hydration.  Patient given 1 unit of packed red blood cells and hemoglobin came up to 7.5.  Will give another unit of packed red blood cells.  Patient received IV iron. 10-10-22.  Patient's hemoglobin up to 9.7 after 2 units of packed red blood cells yesterday.   Patient also received another dose of IV iron.  Ultrasound of the lower extremity shows right nonocclusive DVT.  CODE BLUE was called secondary to asystole.  Patient was intubated by ER physician.  CPR was performed and medications administered.  The patient's son was called during the code and advised to stop CPR.  The patient was pronounced dead on 10/10/22 at 2302/10/21.

## 2022-09-14 NOTE — Assessment & Plan Note (Addendum)
Likely secondary to dehydration.  Repeat calcium came into the normal range.

## 2022-09-14 NOTE — Progress Notes (Signed)
Progress Note   Patient: [name] [name] [id number] DOB: Jun 15, 1946 DOA: 09/12/2022     0 DOS: the patient was seen and examined on 09/14/2022   Brief hospital course: 77 y.o. female with medical history significant for multiple medical problems that include HTN, h/o DVT, GERD, h/o erosive gastritis, autoimmune hepatitis, hyperparathyroidism, osteoporosis, h/o right hip fx, stage 3a CKD, urinary retention, h/o thrombocytopenia, normocytic anemia, IDA, seizures. [name] [name] resides at [facility] and is partially ADL and IADL dependent.   At time of my evaluation, patient seems confused and unable to give any appropriate history.  She understands she is in the hospital but does not know how and why she is in the hospital.   She was brought in from her skilled facility after patient was noted to be tachycardic with heart rate in the 150s and blood pressure of 70/50.  No witnessed tonic-clonic seizures.  No reported hematemesis or hematochezia.  Patient is unable to state if she has had any dark tarry stools lately.  Patient was noted to be pale and intravascularly dry on presentation.  Labs came back showing abnormal hemoglobin multiple at 10 abnormalities including hypocalcemia.  2/28.  Hemoglobin dropped down to 5.5 with IV fluid hydration.  Patient given 1 unit of packed red blood cells and hemoglobin came up to 7.5.  Will give another unit of packed red blood cells.    Assessment and Plan: * Symptomatic anemia Hemoglobin dropped down to 5.5 with IV fluid hydration.  Discontinue IV fluid hydration.  Received 1 unit of packed red blood cells and hemoglobin came up to 7.5.  Will give another unit of packed red blood cells.  Iron studies consistent with anemia of chronic disease.  Appreciate hematology consultation.  Holding Eliquis.  RSV infection Droplet precautions.  Autoimmune hepatitis ([place]) Will hold off on Imuran with anemia.  Continue prednisone.  Stage 3a chronic kidney disease  (CKD) (HCC) Creatinine 1.01 with a GFR 58  Hypertension Continue metoprolol.  Add Norvasc for elevated blood pressure.  History of DVT (deep vein thrombosis) Currently holding Eliquis with anemia.  Will repeat sonogram of the lower extremities tomorrow.  Hypercalcemia Could be secondary to dehydration  Lactic acidosis Received IV fluids on admission  Elevated liver function tests Likely with autoimmune hepatitis        Subjective: Patient is a poor historian.  As per husband she has been over at [facility] for couple months.  Admitted with anemia  Physical Exam: Vitals:   09/14/22 0545 09/14/22 0925 09/14/22 1121 09/14/22 1141  BP: (!) 160/100 (!) 178/100 (!) 187/104 (!) 187/104  Pulse: 89 82 87   Resp: 20 (!) 22    Temp: 98.3 F (36.8 C) 98.3 F (36.8 C)    TempSrc:      SpO2:      Weight:      Height:       Physical Exam HENT:     Head: Normocephalic.     Mouth/Throat:     Pharynx: No oropharyngeal exudate.  Eyes:     General: Lids are normal.     Conjunctiva/sclera: Conjunctivae normal.  Cardiovascular:     Rate and Rhythm: Normal rate and regular rhythm.     Heart sounds: Normal heart sounds, S1 normal and S2 normal.  Pulmonary:     Breath sounds: No decreased breath sounds, wheezing, rhonchi or rales.  Abdominal:     Palpations: Abdomen is soft.     Tenderness: There is no abdominal tenderness.  Musculoskeletal:     Right lower leg: No swelling.     Left lower leg: No swelling.  Skin:    General: Skin is warm.     Findings: No rash.  Neurological:     Mental Status: She is alert.     Data Reviewed: Hemoglobin 5.5 this morning.  After transfusion up to 7.5.  Family Communication: Updated patient's husband on the phone  Disposition: Status is: Inpatient Remains inpatient appropriate because: Being transfused a second unit of packed red blood cells today  Planned Discharge Destination: Home    Time spent: 28 minutes  Author: Loletha Grayer,  MD 09/14/2022 12:38 PM  For on call review [email or web address].

## 2022-09-14 NOTE — Assessment & Plan Note (Addendum)
Sepsis ruled out.  Blood cultures negative x 3 days.  C. difficile testing negative.

## 2022-09-15 ENCOUNTER — Inpatient Hospital Stay: Payer: Medicare PPO

## 2022-09-15 DIAGNOSIS — B338 Other specified viral diseases: Secondary | ICD-10-CM | POA: Diagnosis not present

## 2022-09-15 DIAGNOSIS — L89154 Pressure ulcer of sacral region, stage 4: Secondary | ICD-10-CM

## 2022-09-15 DIAGNOSIS — L89159 Pressure ulcer of sacral region, unspecified stage: Secondary | ICD-10-CM | POA: Insufficient documentation

## 2022-09-15 DIAGNOSIS — D500 Iron deficiency anemia secondary to blood loss (chronic): Secondary | ICD-10-CM | POA: Diagnosis not present

## 2022-09-15 DIAGNOSIS — K754 Autoimmune hepatitis: Secondary | ICD-10-CM | POA: Diagnosis not present

## 2022-09-15 DIAGNOSIS — N1831 Chronic kidney disease, stage 3a: Secondary | ICD-10-CM | POA: Diagnosis not present

## 2022-09-15 DIAGNOSIS — I469 Cardiac arrest, cause unspecified: Secondary | ICD-10-CM

## 2022-09-15 LAB — KAPPA/LAMBDA LIGHT CHAINS
Kappa free light chain: 74.1 mg/L — ABNORMAL HIGH (ref 3.3–19.4)
Kappa, lambda light chain ratio: 1.26 (ref 0.26–1.65)
Lambda free light chains: 59 mg/L — ABNORMAL HIGH (ref 5.7–26.3)

## 2022-09-15 LAB — TYPE AND SCREEN
ABO/RH(D): A POS
Antibody Screen: NEGATIVE
Unit division: 0
Unit division: 0

## 2022-09-15 LAB — BPAM RBC
Blood Product Expiration Date: 202403042359
Blood Product Expiration Date: 202403202359
ISSUE DATE / TIME: 202402280511
ISSUE DATE / TIME: 202402281621
Unit Type and Rh: 6200
Unit Type and Rh: 6200

## 2022-09-15 LAB — BETA 2 MICROGLOBULIN, SERUM: Beta-2 Microglobulin: 2.8 mg/L — ABNORMAL HIGH (ref 0.6–2.4)

## 2022-09-15 LAB — CBC
HCT: 32.3 % — ABNORMAL LOW (ref 36.0–46.0)
Hemoglobin: 9.7 g/dL — ABNORMAL LOW (ref 12.0–15.0)
MCH: 23.5 pg — ABNORMAL LOW (ref 26.0–34.0)
MCHC: 30 g/dL (ref 30.0–36.0)
MCV: 78.4 fL — ABNORMAL LOW (ref 80.0–100.0)
Platelets: 220 10*3/uL (ref 150–400)
RBC: 4.12 MIL/uL (ref 3.87–5.11)
RDW: 20.7 % — ABNORMAL HIGH (ref 11.5–15.5)
WBC: 6.5 10*3/uL (ref 4.0–10.5)
nRBC: 0.5 % — ABNORMAL HIGH (ref 0.0–0.2)

## 2022-09-15 LAB — HAPTOGLOBIN: Haptoglobin: 103 mg/dL (ref 42–346)

## 2022-09-15 LAB — DAT, POLYSPECIFIC AHG (ARMC ONLY): Polyspecific AHG test: NEGATIVE

## 2022-09-15 LAB — PARATHYROID HORMONE, INTACT (NO CA): PTH: 33 pg/mL (ref 15–65)

## 2022-09-15 LAB — GLUCOSE, CAPILLARY: Glucose-Capillary: 93 mg/dL (ref 70–99)

## 2022-09-15 MED ORDER — AMLODIPINE BESYLATE 5 MG PO TABS
5.0000 mg | ORAL_TABLET | Freq: Once | ORAL | Status: AC
  Administered 2022-09-15: 5 mg via ORAL
  Filled 2022-09-15: qty 1

## 2022-09-15 MED ORDER — AMLODIPINE BESYLATE 10 MG PO TABS: 10.0000 mg | ORAL_TABLET | Freq: Every day | ORAL | Status: DC

## 2022-09-16 DIAGNOSIS — I469 Cardiac arrest, cause unspecified: Secondary | ICD-10-CM

## 2022-09-16 NOTE — Evaluation (Signed)
Physical Therapy Evaluation Patient Details Name: [name] [name] MRN: [id number] DOB: [birth year] Today's Date:   History of Present Illness  77 y.o. female with medical history significant for multiple medical problems that include HTN, h/o DVT, GERD, h/o erosive gastritis, autoimmune hepatitis, hyperparathyroidism, osteoporosis, h/o right hip fx, stage 3a CKD, urinary retention, h/o thrombocytopenia, normocytic anemia, IDA, seizures. Recently residing at [facility] and is partially ADL and IADL dependent.  Admitted with anemia and RSV.  Clinical Impression  Pt pleasant but confused and struggled a initiate a lot of movement w/o direct physical assist.  She did show effort when AAROM provided for LE movement, mobility and attempts at standing but is very weak and limited.  Pt was very limited functionally but does show some effort with excessive cuing.  Pt clearly needing a lot of assist for mobility recommending continued PT to address functional limitations, will need 24/7 assist recommending STR at d/c.     Recommendations for follow up therapy are one component of a multi-disciplinary discharge planning process, led by the attending physician.  Recommendations may be updated based on patient status, additional functional criteria and insurance authorization.  Follow Up Recommendations Skilled nursing-short term rehab (<3 hours/day) Can patient physically be transported by private vehicle: No    Assistance Recommended at Discharge Frequent or constant Supervision/Assistance  Patient can return home with the following  Two people to help with walking and/or transfers;A lot of help with bathing/dressing/bathroom;Assistance with cooking/housework;Assist for transportation    Equipment Recommendations  (TBD at rehab)  Recommendations for Other Services       Functional Status Assessment Patient has had a recent decline in their functional status and demonstrates the ability to  make significant improvements in function in a reasonable and predictable amount of time.     Precautions / Restrictions Precautions Precautions: Fall Restrictions Weight Bearing Restrictions: No      Mobility  Bed Mobility Overal bed mobility: Needs Assistance Bed Mobility: Sit to Supine, Supine to Sit     Supine to sit: Mod assist, Max assist Sit to supine: Mod assist, Max assist   General bed mobility comments: Pt was able to initiate some minimal movement with repeated cuing but ultiamtely did not show a lot of strength/effort with getting to sitting or back to supine.    Transfers Overall transfer level: Needs assistance Equipment used: Rolling walker (2 wheels) Transfers: Sit to/from Stand Sit to Stand: Max assist, Total assist           General transfer comment: Pt nods yes when PT cued to attempt rising to standing, but she could not self initiate despite plenty of encouragement.  Ultimately max assist to get to/maintain standing (~30 seconds before pt needed to sit).  Pt needed to block feet and give heavy assist.    Ambulation/Gait               General Gait Details: unsafe to attempt this date  Stairs            Wheelchair Mobility    Modified Rankin (Stroke Patients Only)       Balance Overall balance assessment: Needs assistance Sitting-balance support: Bilateral upper extremity supported Sitting balance-Leahy Scale: Poor Sitting balance - Comments: On initial sitting PT attempted to turn loose and she immediately leaned/fell to the L.  Assisted back to upright, but pt needing almost constant hand-on assist to maintain upright.   Standing balance support: Bilateral upper extremity supported Standing balance-Leahy Scale: Zero Standing balance  comment: max assist in standing to maintain upright in walker                             Pertinent Vitals/Pain Pain Assessment Pain Assessment: Faces Faces Pain Scale: Hurts a  little bit Pain Location: unable to state, mild groaning with mobility transitions    Home Living Family/patient expects to be discharged to:: Unsure                        Prior Function Prior Level of Function : Patient poor historian/Family not available             Mobility Comments: pt states that she has/uses a walker with repeated questioning but too confused to really say what or how much she could do. ADLs Comments: documentation from ~10 months ago indicates independent, but this seems unlikely per today's activity     Hand Dominance        Extremity/Trunk Assessment   Upper Extremity Assessment Upper Extremity Assessment: Difficult to assess due to impaired cognition;Generalized weakness    Lower Extremity Assessment Lower Extremity Assessment: Difficult to assess due to impaired cognition;Generalized weakness (Pt able to initiate some movements after AAROM cuing but struggled to do a lot of AROM/strength testing)       Communication   Communication: Expressive difficulties  Cognition Arousal/Alertness: Awake/alert Behavior During Therapy: Flat affect Overall Cognitive Status: No family/caregiver present to determine baseline cognitive functioning                                 General Comments: Pt able to state her name and month/day of her birthday; could not get year despite much cuing.  Similarly cues for today's date, location, situation were largely met with blank stares        General Comments General comments (skin integrity, edema, etc.): Pt weak, limited activity initiation    Exercises     Assessment/Plan    PT Assessment Patient needs continued PT services  PT Problem List Decreased strength;Decreased range of motion;Decreased activity tolerance;Decreased balance;Decreased mobility;Decreased coordination;Decreased cognition;Decreased knowledge of use of DME;Decreased safety awareness       PT Treatment  Interventions DME instruction;Gait training;Functional mobility training;Therapeutic activities;Therapeutic exercise;Balance training;Neuromuscular re-education;Cognitive remediation;Patient/family education    PT Goals (Current goals can be found in the Care Plan section)  Acute Rehab PT Goals Patient Stated Goal: unable to state PT Goal Formulation: Patient unable to participate in goal setting Time For Goal Achievement: 09/28/22 Potential to Achieve Goals: Fair    Frequency Min 2X/week     Co-evaluation               AM-PAC PT "6 Clicks" Mobility  Outcome Measure Help needed turning from your back to your side while in a flat bed without using bedrails?: A Lot Help needed moving from lying on your back to sitting on the side of a flat bed without using bedrails?: Total Help needed moving to and from a bed to a chair (including a wheelchair)?: Total Help needed standing up from a chair using your arms (e.g., wheelchair or bedside chair)?: Total Help needed to walk in hospital room?: Total Help needed climbing 3-5 steps with a railing? : Total 6 Click Score: 7    End of Session Equipment Utilized During Treatment: Gait belt Activity Tolerance: Patient limited by fatigue Patient  left: with bed alarm set;with call bell/phone within reach;with nursing/sitter in room Nurse Communication: Mobility status PT Visit Diagnosis: Muscle weakness (generalized) (M62.81);Difficulty in walking, not elsewhere classified (R26.2);Unsteadiness on feet (R26.81)    Time: [id number] PT Time Calculation (min) (ACUTE ONLY): 26 min   Charges:   PT Evaluation $PT Eval Low Complexity: 1 Low          Kreg Shropshire, DPT , 10:53 AM

## 2022-09-16 NOTE — TOC Progression Note (Addendum)
Transition of Care [facility]) - Progression Note    Patient Details  Name: [name] [name] MRN: [id number] Date of Birth: 1945-08-21  [facility]) CM/SW Contact  Beverly Sessions, RN Phone Number: , 1:47 PM  Clinical Narrative:    Received call from [facility] requesting patients prior level of function Reached out to Tammy at [facility].  She is to going to check and let me know Navi requests this information be updated in the portal  220 pm per Tammy at [facility] patient is mod assist plus 2 for bed mobility.  Updated in navi portal   Expected Discharge Plan: [place] Barriers to Discharge: Continued Medical Work up  Expected Discharge Plan and Services     Post Acute Care Choice: NA Living arrangements for the past 2 months: [place]                                       Social Determinants of Health (SDOH) Interventions SDOH Screenings   Tobacco Use: Low Risk  (08/10/2022)    Readmission Risk Interventions     No data to display

## 2022-09-16 NOTE — Assessment & Plan Note (Addendum)
Seen by oncology and they deemed this to be iron deficiency anemia.  Ferritin likely falsely high with RSV infection.  Patient received 2 units of packed red blood cells and 2 iron infusions.  Last hemoglobin was 9.7.

## 2022-09-16 NOTE — Consult Note (Signed)
WOC Nurse Consult Note: Reason for Consult: stage 4 pressure injury, sacrum.  Chronic.  Admitted with symptomatic anemia Wound type: nonhealing pressure injury Pressure Injury POA: Yes   Measurement:  bedside RN to obtain measurements  Drainage (amount, consistency, odor) minimal serosanguinous  no odor Periwound:  intact,  Dressing procedure/placement/frequency: Cleanse sacral wound with NS and pat dry.  Fill wound defect with alginate dressing (LAWSON # [id number]).  Cover with gauze and sacral foam. Change daily.  Will not follow at this time.  Please re-consult if needed.  Estrellita Ludwig MSN, RN, FNP-BC CWON Wound, Ostomy, Continence Nurse [facility] [phone] Pager [phone]

## 2022-09-16 NOTE — Assessment & Plan Note (Addendum)
Present on admission.  As per wound care nurse has been stage IV for over a year now.

## 2022-09-16 NOTE — TOC Transition Note (Addendum)
Transition of Care [facility]) - CM/SW Discharge Note   Patient Details  Name: [name] [name] MRN: [id number] Date of Birth: [birth year]  Transition of Care [facility]) CM/SW Contact:  Ross Ludwig, LCSW Phone Number: 09-24-2022, 10:23 AM   Clinical Narrative:     Patient has passed away, CSW updated Tammy at [facility] and [place].  Insurance authorization has been cancelled.     Barriers to Discharge: Continued Medical Work up   Patient Goals and CMS Choice      Discharge Placement                         Discharge Plan and Services Additional resources added to the After Visit Summary for       Post Acute Care Choice: NA                               Social Determinants of Health (SDOH) Interventions SDOH Screenings   Tobacco Use: Low Risk  (08/10/2022)     Readmission Risk Interventions     No data to display

## 2022-09-16 NOTE — TOC Progression Note (Signed)
Transition of Care [facility]) - Progression Note    Patient Details  Name: [name] [name] MRN: [id number] Date of Birth: 07-31-1945  Transition of Care [facility]) CM/SW Contact  Beverly Sessions, RN Phone Number: , 11:57 AM  Clinical Narrative:     Per MD potentially ready for discharge over the weekend  Auth started in [employer]vi portal.  Candace Cruise [id number] id.  Auth pending Tammy notified at [facility] Per Tammy patient does not have to complete quarantine prior to return   Expected Discharge Plan: [place] Barriers to Discharge: Continued Medical Work up  Expected Discharge Plan and Services     Post Acute Care Choice: NA Living arrangements for the past 2 months: [place]                                       Social Determinants of Health (SDOH) Interventions SDOH Screenings   Tobacco Use: Low Risk  (08/10/2022)    Readmission Risk Interventions     No data to display

## 2022-09-16 NOTE — Progress Notes (Signed)
       CROSS COVER NOTE  NAME: TIERRAH CREGER MRN: [id number] DOB : 1945/09/19    HPI/Events of Note   Code blue arrest - asystole See code sheet  Assessment and  Interventions   Assessment: Son notified during code blue event and instruct to stop Plan: Time of death [place] NP [facility]

## 2022-09-16 NOTE — Assessment & Plan Note (Signed)
Code team responded to cardiac arrest and asystole was on the monitor.  Patient underwent intubation and CPR with medication administration.  Patient's son was contacted during the code and advised to stop.

## 2022-09-16 NOTE — Evaluation (Signed)
Occupational Therapy Evaluation Patient Details Name: [name] [name] MRN: [id number] DOB: [birth year] Today's Date:    History of Present Illness Pt is a 77 year old female admitted with symptomatic anemia, RSV; PMH significant for  HTN, h/o DVT, GERD, h/o erosive gastritis, autoimmune hepatitis, hyperparathyroidism, osteoporosis, h/o right hip fx, stage 3a CKD, urinary retention, h/o thrombocytopenia, normocytic anemia, IDA, seizures; of note, pt is a resident of [facility]   Clinical Impression   Chart reviewed, pt greeted in bed, oriented to self and place, not oriented to date or situation. Pt is a ?historian with increased time required for all one step direction following. PTA pt endorses assist from staff at SNF for Adl/IADL, endorses she was amb with RW but unable to provide the last time she amb with AD. Pt presents with deficits in strength, endurance, activity tolerance, balance, cognitio all affecting safe and optimal ADL completion. Pt has poor static sitting balance requiring at least MIN A at all times at edge of bed with lateral leans noted. MAX A+2 required for STS with RW. MAX-TOTAL A +2 required for toileting at bed level, rolling with MAX A +1. Pt is left as receive din care of RN, all needs met. OT will follow acutely.      Recommendations for follow up therapy are one component of a multi-disciplinary discharge planning process, led by the attending physician.  Recommendations may be updated based on patient status, additional functional criteria and insurance authorization.   Follow Up Recommendations  Skilled nursing-short term rehab (<3 hours/day)     Assistance Recommended at Discharge Frequent or constant Supervision/Assistance  Patient can return home with the following Two people to help with walking and/or transfers;Two people to help with bathing/dressing/bathroom    Functional Status Assessment  Patient has had a recent decline in their functional  status and demonstrates the ability to make significant improvements in function in a reasonable and predictable amount of time.  Equipment Recommendations  Other (comment) (per next venue of care)    Recommendations for Other Services       Precautions / Restrictions Precautions Precautions: Fall Restrictions Weight Bearing Restrictions: No      Mobility Bed Mobility Overal bed mobility: Needs Assistance Bed Mobility: Sit to Supine, Supine to Sit     Supine to sit: Max assist Sit to supine: Max assist, +2 for physical assistance        Transfers Overall transfer level: Needs assistance Equipment used: Rolling walker (2 wheels) Transfers: Sit to/from Stand Sit to Stand: Max assist, Total assist, +2 physical assistance                  Balance Overall balance assessment: Needs assistance Sitting-balance support: Bilateral upper extremity supported Sitting balance-Leahy Scale: Poor Sitting balance - Comments: L lateral lean throughout, at least MIN A required for static sitting balance   Standing balance support: Bilateral upper extremity supported Standing balance-Leahy Scale: Zero                             ADL either performed or assessed with clinical judgement   ADL Overall ADL's : Needs assistance/impaired         Upper Body Bathing: Maximal assistance   Lower Body Bathing: Maximal assistance   Upper Body Dressing : Maximal assistance   Lower Body Dressing: Maximal assistance       Toileting- Clothing Manipulation and Hygiene: Total assistance;+2 for physical assistance;Bed level  Toileting - Clothing Manipulation Details (indicate cue type and reason): incontient BM             Vision Patient Visual Report: No change from baseline Additional Comments: will continue to assess     Perception     Praxis      Pertinent Vitals/Pain Pain Assessment Pain Assessment: Faces Faces Pain Scale: Hurts a little bit Pain  Location: generalized Pain Descriptors / Indicators: Grimacing Pain Intervention(s): Monitored during session, Repositioned     Hand Dominance     Extremity/Trunk Assessment Upper Extremity Assessment Upper Extremity Assessment: Difficult to assess due to impaired cognition;Generalized weakness   Lower Extremity Assessment Lower Extremity Assessment: Difficult to assess due to impaired cognition;Generalized weakness       Communication     Cognition Arousal/Alertness: Awake/alert Behavior During Therapy: Flat affect Overall Cognitive Status: No family/caregiver present to determine baseline cognitive functioning Area of Impairment: Orientation, Attention, Memory, Following commands, Safety/judgement, Awareness, Problem solving, [place] Recovery Scale                 Orientation Level: Disoriented to, Date, Situation Current Attention Level: Sustained Memory: Decreased short-term memory Following Commands: Follows one step commands with increased time Safety/Judgement: Decreased awareness of deficits Awareness: Intellectual Problem Solving: Slow processing, Difficulty sequencing, Requires verbal cues, Requires tactile cues       General Comments  vitals monitored, appear stable throughout; RN Joe In room to assess sacral wound and provide care    Exercises     Shoulder Instructions      Home Living Family/patient expects to be discharged to:: Unsure                                 Additional Comments: Per chart pt is from [facility]/long term      Prior Functioning/Environment Prior Level of Function : Patient poor historian/Family not available             Mobility Comments: pt reports she has not walked in over a month, ?historian ADLs Comments: assist for ADL/IADL per pt report        OT Problem List: Decreased strength;Decreased activity tolerance;Impaired balance (sitting and/or standing);Decreased safety awareness;Decreased  cognition;Decreased knowledge of precautions;Decreased knowledge of use of DME or AE      OT Treatment/Interventions: Self-care/ADL training;Balance training;Therapeutic exercise;Therapeutic activities;Energy conservation;Cognitive remediation/compensation;DME and/or AE instruction;Patient/family education    OT Goals(Current goals can be found in the care plan section) Acute Rehab OT Goals Patient Stated Goal: work with therapy OT Goal Formulation: With patient Time For Goal Achievement: 09/29/22 Potential to Achieve Goals: Good ADL Goals Pt Will Perform Grooming: with min assist;sitting Pt Will Perform Lower Body Dressing: with mod assist Pt Will Transfer to Toilet: with mod assist Pt Will Perform Toileting - Clothing Manipulation and hygiene: with mod assist  OT Frequency: Min 2X/week    Co-evaluation              AM-PAC OT "6 Clicks" Daily Activity     Outcome Measure Help from another person eating meals?: A Lot Help from another person taking care of personal grooming?: A Lot Help from another person toileting, which includes using toliet, bedpan, or urinal?: Total Help from another person bathing (including washing, rinsing, drying)?: Total Help from another person to put on and taking off regular upper body clothing?: A Lot Help from another person to put on and taking off regular lower body  clothing?: A Lot 6 Click Score: 10   End of Session Equipment Utilized During Treatment: Rolling walker (2 wheels) Nurse Communication: Mobility status  Activity Tolerance: Patient limited by fatigue Patient left: in bed;with call bell/phone within reach;with bed alarm set  OT Visit Diagnosis: Other abnormalities of gait and mobility (R26.89)                Time: [id number] OT Time Calculation (min): 26 min Charges:  OT General Charges $OT Visit: 1 Visit OT Evaluation $OT Eval Moderate Complexity: 1 Mod  Shanon Payor, OTD OTR/L  , 3:57 PM

## 2022-09-16 NOTE — TOC Progression Note (Signed)
Transition of Care [facility]) - Progression Note    Patient Details  Name: [name] [name] MRN: [id number] Date of Birth: 18-Jan-1946  Transition of Care [facility]) CM/SW Contact  Beverly Sessions, RN Phone Number: , 9:36 AM  Clinical Narrative:     Pt/OT eval pending. Was held yesterday due to low HGB.  TOC to follow up with husband after therapy recs to determine if he wants to still move forward with a home discharge or pursue placement at another facility   Expected Discharge Plan: [place] Barriers to Discharge: Continued Medical Work up  Expected Discharge Plan and Services     Post Acute Care Choice: NA Living arrangements for the past 2 months: [place]                                       Social Determinants of Health (SDOH) Interventions SDOH Screenings   Tobacco Use: Low Risk  (08/10/2022)    Readmission Risk Interventions     No data to display

## 2022-09-16 NOTE — TOC Progression Note (Signed)
Transition of Care [facility]) - Progression Note    Patient Details  Name: [name] [name] MRN: [id number] Date of Birth: April 17, 1946  Transition of Care [facility]) CM/SW Contact  Beverly Sessions, RN Phone Number: , 11:11 AM  Clinical Narrative:     Therapy recommending SNF Followed up with spouse Mr Tenn by phone Since Therapy is recommending for STR spouse is in agreement for patient to return [facility] if insurance approves If insurance denies he wishes to take patient home instead of private paying to LTC at [facility] sent for signature Patient came in RSV positive.  Messages sent to Tammy at [facility] to confirm patient will not have to quarantine prior to return Per MD patient not medically ready to start insurance auth   Expected Discharge Plan: [place] Barriers to Discharge: Continued Medical Work up  Expected Discharge Plan and Services     Post Acute Care Choice: NA Living arrangements for the past 2 months: [place]                                       Social Determinants of Health (SDOH) Interventions SDOH Screenings   Tobacco Use: Low Risk  (08/10/2022)    Readmission Risk Interventions     No data to display

## 2022-09-16 NOTE — Progress Notes (Signed)
Progress Note   Patient: [name] [name] [id number] DOB: February 21, 1946 DOA: 09/07/2022     1 DOS: the patient was seen and examined on    Brief hospital course: 77 y.o. female with medical history significant for multiple medical problems that include HTN, h/o DVT, GERD, h/o erosive gastritis, autoimmune hepatitis, hyperparathyroidism, osteoporosis, h/o right hip fx, stage 3a CKD, urinary retention, h/o thrombocytopenia, normocytic anemia, IDA, seizures. [name] [name] resides at [facility] and is partially ADL and IADL dependent.   At time of my evaluation, patient seems confused and unable to give any appropriate history.  She understands she is in the hospital but does not know how and why she is in the hospital.   She was brought in from her skilled facility after patient was noted to be tachycardic with heart rate in the 150s and blood pressure of 70/50.  No witnessed tonic-clonic seizures.  No reported hematemesis or hematochezia.  Patient is unable to state if she has had any dark tarry stools lately.  Patient was noted to be pale and intravascularly dry on presentation.  Labs came back showing abnormal hemoglobin multiple at 10 abnormalities including hypocalcemia.  2/28.  Hemoglobin dropped down to 5.5 with IV fluid hydration.  Patient given 1 unit of packed red blood cells and hemoglobin came up to 7.5.  Will give another unit of packed red blood cells. 2/29.  Patient's hemoglobin up to 9.7 after 2 units of packed red blood cells yesterday.  Ultrasound of the lower extremity shows right nonocclusive DVT.   Assessment and Plan: * Iron deficiency anemia due to chronic blood loss Seen by oncology and they deemed this to be iron deficiency anemia.  Ferritin likely falsely high with RSV infection.  Patient received 2 units of packed red blood cells and hemoglobin up to 9.7.  Patient ordered for 2 iron infusions.  Eliquis currently on hold.  RSV infection Droplet  precautions.  Autoimmune hepatitis ([place]) Will hold off on Imuran with anemia.  Continue prednisone.  Stage 3a chronic kidney disease (CKD) (HCC) Creatinine 1.01 with a GFR 58  Hypertension Continue metoprolol.  Added Norvasc for elevated blood pressure.  Sacral decubitus ulcer Present on admission.  As per wound care nurse has been stage IV for over a year now.  History of DVT (deep vein thrombosis) Currently holding Eliquis with anemia.  Will repeat sonogram of the lower extremity shows right DVT nonocclusive.  Will likely have to go back on anticoagulation.  Hypercalcemia Could be secondary to dehydration.  Repeat calcium came into the normal range.  Lactic acidosis Received IV fluids on admission  Elevated liver function tests Likely with autoimmune hepatitis      Subjective: Patient answers some simple yes or no questions.  Does not elaborate much.  Not the best historian.  Admitted with anemia.  Physical Exam: Vitals:   09/14/22 1654 09/14/22 1925  0400  0828  BP: (!) 159/96 (!) 169/100 (!) 164/102 (!) 167/92  Pulse: 90  91 97  Resp:   18 18  Temp: 98.2 F (36.8 C) 98 F (36.7 C) 98 F (36.7 C) 97.7 F (36.5 C)  TempSrc: Oral Oral    SpO2: 96%  96% 96%  Weight:      Height:       Physical Exam HENT:     Head: Normocephalic.     Mouth/Throat:     Pharynx: No oropharyngeal exudate.  Eyes:     General: Lids are normal.  Conjunctiva/sclera: Conjunctivae normal.  Cardiovascular:     Rate and Rhythm: Normal rate and regular rhythm.     Heart sounds: Normal heart sounds, S1 normal and S2 normal.  Pulmonary:     Breath sounds: No decreased breath sounds, wheezing, rhonchi or rales.  Abdominal:     Palpations: Abdomen is soft.     Tenderness: There is no abdominal tenderness.  Musculoskeletal:     Right lower leg: No swelling.     Left lower leg: No swelling.  Skin:    General: Skin is warm.     Findings: No rash.  Neurological:      Mental Status: She is alert.     Comments: Answers some simple yes/no questions.  Barely able to straight leg raise.     Data Reviewed: Last creatinine 1.01, last hemoglobin 9.7 after transfusion.  Family Communication: Updated husband on the phone  Disposition: Status is: Inpatient Remains inpatient appropriate because: Need to figure out what to do with anticoagulation.  Planned Discharge Destination: Skilled nursing facility    Time spent: 28 minutes  Author: Loletha Grayer, MD  2:19 PM  For on call review [email or web address].

## 2022-09-16 NOTE — NC FL2 (Addendum)
cancelled

## 2022-09-16 NOTE — Progress Notes (Signed)
OT Cancellation Note  Patient Details Name: [name] [name] MRN: [id number] DOB: 1945/12/16   Cancelled Treatment:    Reason Eval/Treat Not Completed: Other (comment) (attempted to see pt for OT evaluation, pt off the floor at ultrasound, will re attempt as able.Shanon Payor, OTD OTR/L  , 10:58 AM

## 2022-09-16 NOTE — ED Provider Notes (Signed)
[facility] Department of Emergency Medicine   Code Blue CONSULT NOTE  Chief Complaint: Cardiac arrest/unresponsive   Level V Caveat: Unresponsive  History of present illness: I was contacted by the hospital for a CODE BLUE cardiac arrest upstairs and presented to the patient's bedside.   Patient pale and apneic, bag-valve-mask ventilation being performed by respiratory therapist at bedside with chest compressions ongoing.  Patient reportedly admitted for anemia and possible sepsis, no bleeding reported over the course of the day today.  Staff addendum to the room when they received a call from telemetry that patient was in asystole.  ROS: Unable to obtain, Level V caveat  Scheduled Meds:  sodium chloride   Intravenous Once   [START ON 09-28-2022] amLODipine  10 mg Oral Daily   melatonin  5 mg Oral QHS   metoprolol tartrate  25 mg Oral BID   pantoprazole (PROTONIX) IV  40 mg Intravenous Q24H   predniSONE  7.5 mg Oral Daily   sertraline  25 mg Oral Daily   sodium chloride flush  3 mL Intravenous Q12H   Continuous Infusions: PRN Meds:.acetaminophen **OR** acetaminophen, albuterol, hydrALAZINE, ondansetron **OR** ondansetron (ZOFRAN) IV, mouth rinse, senna-docusate Past Medical History:  Diagnosis Date   Abnormal LFTs (liver function tests)    Autoimmune hepatitis ([place]) 2018   Autoimmune hepatitis ([place])    Autoimmune hepatitis treated with steroids ([place])    DOE (dyspnea on exertion)    GERD (gastroesophageal reflux disease)    Hepatitis    Hyperlipidemia    Hypertension    Iron deficiency anemia due to chronic blood loss 05/27/2021   Osteoporosis    Past Surgical History:  Procedure Laterality Date   ABDOMINAL HYSTERECTOMY     COLONOSCOPY  10/06/2015   COLONOSCOPY WITH PROPOFOL N/A 08/27/2018   Procedure: COLONOSCOPY WITH PROPOFOL;  Surgeon: Manya Silvas, MD;  Location: [facility] ENDOSCOPY;  Service: Endoscopy;  Laterality: N/A;    ESOPHAGOGASTRODUODENOSCOPY (EGD) WITH PROPOFOL N/A 08/27/2018   Procedure: ESOPHAGOGASTRODUODENOSCOPY (EGD) WITH PROPOFOL;  Surgeon: Manya Silvas, MD;  Location: [facility] ENDOSCOPY;  Service: Endoscopy;  Laterality: N/A;   ESOPHAGOGASTRODUODENOSCOPY (EGD) WITH PROPOFOL N/A 05/21/2021   Procedure: ESOPHAGOGASTRODUODENOSCOPY (EGD) WITH PROPOFOL;  Surgeon: Lesly Rubenstein, MD;  Location: [facility] ENDOSCOPY;  Service: Endoscopy;  Laterality: N/A;   HIP ARTHROPLASTY Right 08/20/2021   Procedure: ARTHROPLASTY BIPOLAR HIP (HEMIARTHROPLASTY);  Surgeon: Thornton Park, MD;  Location: [facility] ORS;  Service: Orthopedics;  Laterality: Right;   LIVER BIOPSY     PERIPHERAL VASCULAR THROMBECTOMY Bilateral 11/04/2021   Procedure: PERIPHERAL VASCULAR THROMBECTOMY;  Surgeon: Algernon Huxley, MD;  Location: [facility] LAB;  Service: Cardiovascular;  Laterality: Bilateral;   TOTAL HIP REVISION Right 09/18/2021   Procedure: FEMORAL COMPONENT REVISION CONVERSION TO TOTAL HIP ARTHROPLASTY;  Surgeon: Rod Can, MD;  Location: [facility] ORS;  Service: Orthopedics;  Laterality: Right;   Social History   Socioeconomic History   Marital status: Married    Spouse name: Not on file   Number of children: Not on file   Years of education: Not on file   Highest education level: Not on file  Occupational History   Not on file  Tobacco Use   Smoking status: Never   Smokeless tobacco: Never  Vaping Use   Vaping Use: Never used  Substance and Sexual Activity   Alcohol use: No   Drug use: Never   Sexual activity: Not Currently    Birth control/protection: Post-menopausal  Other Topics Concern   Not  on file  Social History Narrative   Not on file   Social Determinants of Health   Financial Resource Strain: Not on file  Food Insecurity: Not on file  Transportation Needs: Not on file  Physical Activity: Not on file  Stress: Not on file  Social Connections: Not on file  Intimate Partner Violence: Not on file    Allergies  Allergen Reactions   Sulfa Antibiotics Nausea And Vomiting    Pt has not had a sulfa drug in years and is not certain of reaction but suspects nausea and vomiting.   Sulfasalazine Nausea And Vomiting    Last set of Vital Signs (not current) Vitals:    1525  1930  BP: (!) 161/89 (!) 140/109  Pulse: 91 99  Resp: 18 (!) 24  Temp: 97.8 F (36.6 C) 98 F (36.7 C)  SpO2: 95% 94%      Physical Exam  Gen: unresponsive Cardiovascular: pulseless  Resp: apneic. Breath sounds equal bilaterally with bagging  Abd: nondistended  Neuro: GCS 3, unresponsive to pain  HEENT: No blood in posterior pharynx, gag reflex absent  Neck: No crepitus  Musculoskeletal: No deformity  Skin: warm  Procedures (when applicable, including Critical Care time): Procedure Name: Intubation Date/Time:  11:40 PM  Performed by: Blake Divine, MDPre-anesthesia Checklist: Patient identified, Patient being monitored, Emergency Drugs available, Timeout performed and Suction available Oxygen Delivery Method: Ambu bag Preoxygenation: Pre-oxygenation with 100% oxygen Ventilation: Mask ventilation without difficulty Laryngoscope Size: Glidescope and 4 Grade View: Grade I Tube size: 7.5 mm Number of attempts: 1 Airway Equipment and Method: Video-laryngoscopy Placement Confirmation: ETT inserted through vocal cords under direct vision, CO2 detector and Breath sounds checked- equal and bilateral Secured at: 22 cm Tube secured with: ETT holder Dental Injury: Teeth and Oropharynx as per pre-operative assessment        MDM / Assessment and Plan I arrived to the bedside with chest compressions ongoing and patient receiving bag-valve-mask ventilation.  Asystole noted on the monitor and patient received multiple rounds of epinephrine along with calcium and bicarb.  She was intubated without difficulty, did have PEA following epinephrine administration but continued to be apneic  and pulseless.  Following 3 rounds of epinephrine and compressions, NP Randol Kern spoke with patient's family over the phone, who requested that we stop compressions.  Time of death was called.    Blake Divine, MD  [phone]

## 2022-09-16 NOTE — Progress Notes (Addendum)
CCMD called stating pt was running asystole. This RN entered room and pt was unresponsive and not breathing. Called a code Blue and started CPR.  Code team to room for assistance.  Pt pronounced deaceased at 10-07-02. Son aware on phone with NP during code and pr NP son requested to cease effort.  Post mortem checklist completed. Son states they will not be coming up here tonight.

## 2022-09-16 DEATH — deceased

## 2022-09-18 LAB — CULTURE, BLOOD (ROUTINE X 2)
Culture: NO GROWTH
Culture: NO GROWTH

## 2022-09-19 LAB — PTH-RELATED PEPTIDE: PTH-related peptide: <2 pmol/L

## 2022-09-20 LAB — MULTIPLE MYELOMA PANEL, SERUM
Albumin SerPl Elph-Mcnc: 3 g/dL (ref 2.9–4.4)
Albumin/Glob SerPl: 1.1 (ref 0.7–1.7)
Alpha 1: 0.3 g/dL (ref 0.0–0.4)
Alpha2 Glob SerPl Elph-Mcnc: 0.6 g/dL (ref 0.4–1.0)
B-Globulin SerPl Elph-Mcnc: 0.8 g/dL (ref 0.7–1.3)
Gamma Glob SerPl Elph-Mcnc: 1.1 g/dL (ref 0.4–1.8)
Globulin, Total: 2.8 g/dL (ref 2.2–3.9)
IgA: 424 mg/dL — ABNORMAL HIGH (ref 64–422)
IgG (Immunoglobin G), Serum: 1011 mg/dL (ref 586–1602)
IgM (Immunoglobulin M), Srm: 135 mg/dL (ref 26–217)
Total Protein ELP: 5.8 g/dL — ABNORMAL LOW (ref 6.0–8.5)

## 2022-10-17 NOTE — Death Summary Note (Signed)
DEATH SUMMARY   Patient Details  Name: [name] [name] MRN: [id number] DOB: 10-14-1945 [id number] Admission/Discharge Information   Admit Date:  10/09/2022  Date of Death: Date of Death: 10/11/2022  Time of Death: Time of Death: 10/22/2302  Length of Stay: 2   Principle Cause of death: Cardiac arrest  Hospital Diagnoses: Principal Problem:   Asystole ([name] [name]) Active Problems:   Iron deficiency anemia due to chronic blood loss   RSV infection   Autoimmune hepatitis (HCC)   Stage 3a chronic kidney disease (CKD) (HCC)   Hypertension   Elevated liver function tests   Lactic acidosis   Hypercalcemia   History of DVT (deep vein thrombosis)   Sacral decubitus ulcer   Hospital Course: 77 y.o. female with medical history significant for multiple medical problems that include HTN, h/o DVT, GERD, h/o erosive gastritis, autoimmune hepatitis, hyperparathyroidism, osteoporosis, h/o right hip fx, stage 3a CKD, urinary retention, h/o thrombocytopenia, normocytic anemia, IDA, seizures. [name] [name] resides at [facility] and is partially ADL and IADL dependent.   The patient seems confused and unable to give any appropriate history.  She understands she is in the hospital but does not know how and why she is in the hospital.   She was brought in from her skilled facility after patient was noted to be tachycardic with heart rate in the 150s and blood pressure of 70/50.  No witnessed tonic-clonic seizures.  No reported hematemesis or hematochezia.  Patient is unable to state if she has had any dark tarry stools lately.  Patient was noted to be pale and intravascularly dry on presentation.  Labs came back showing abnormal hemoglobin multiple at 10 abnormalities including hypocalcemia.  2/28.  Hemoglobin dropped down to 5.5 with IV fluid hydration.  Patient given 1 unit of packed red blood cells and hemoglobin came up to 7.5.  Will give another unit of packed red blood cells.  Patient received  IV iron. 10/11/22.  Patient's hemoglobin up to 9.7 after 2 units of packed red blood cells yesterday.   Patient also received another dose of IV iron.  Ultrasound of the lower extremity shows right nonocclusive DVT.  CODE BLUE was called secondary to asystole.  Patient was intubated by ER physician.  CPR was performed and medications administered.  The patient's son was called during the code and advised to stop CPR.  The patient was pronounced dead on 2022-10-11 at Oct 22, 2302.   Assessment and Plan: * Asystole ([name] [name]) Code team responded to cardiac arrest and asystole was on the monitor.  Patient underwent intubation and CPR with medication administration.  Patient's son was contacted during the code and advised to stop.  Iron deficiency anemia due to chronic blood loss Seen by oncology and they deemed this to be iron deficiency anemia.  Ferritin likely falsely high with RSV infection.  Patient received 2 units of packed red blood cells and 2 iron infusions.  Last hemoglobin was 9.7.  RSV infection Sepsis ruled out.  Blood cultures negative x 3 days.  C. difficile testing negative.  Autoimmune hepatitis ([name] [name]) Imuran was held with low blood counts.  The patient was on prednisone.  Stage 3a chronic kidney disease (CKD) ([name] [name]) Her last creatinine was 1.01.  Hypertension Patient was treated with metoprolol and Norvasc was added secondary to high blood pressure.  Sacral decubitus ulcer Present on admission.  As per wound care nurse has been stage IV for over a year now.  History of DVT (deep vein thrombosis)  Eliquis was on hold secondary to severe anemia. Repeat sonogram of the lower extremity shows right DVT nonocclusive.    Hypercalcemia Likely secondary to dehydration.  Repeat calcium came into the normal range.  Lactic acidosis Received IV fluids on admission  Elevated liver function tests Likely with autoimmune hepatitis         Procedures: CPR and intubation  Consultations:  Gastroenterology, oncology  The results of significant diagnostics from this hospitalization (including imaging, microbiology, ancillary and laboratory) are listed below for reference.   Significant Diagnostic Studies: US Venous Img Lower Bilateral (DVT)  Result Date:  CLINICAL DATA:  Swelling EXAM: RIGHT LOWER EXTREMITY VENOUS DOPPLER ULTRASOUND TECHNIQUE: Gray-scale sonography with graded compression, as well as color Doppler and duplex ultrasound were performed to evaluate the lower extremity deep venous systems from the level of the common femoral vein and including the common femoral, femoral, profunda femoral, popliteal and calf veins including the posterior tibial, peroneal and gastrocnemius veins when visible. The superficial great saphenous vein was also interrogated. Spectral Doppler was utilized to evaluate flow at rest and with distal augmentation maneuvers in the common femoral, femoral and popliteal veins. COMPARISON:  None available FINDINGS: Contralateral Common Femoral Vein: Respiratory phasicity is normal and symmetric with the symptomatic side. No evidence of thrombus. Normal compressibility. Common Femoral Vein: No evidence of thrombus. Normal compressibility, respiratory phasicity and response to augmentation. Saphenofemoral Junction: No evidence of thrombus. Normal compressibility and flow on color Doppler imaging. Profunda Femoral Vein: No evidence of thrombus. Normal compressibility and flow on color Doppler imaging. Femoral Vein: Diminutive. Nonocclusive thrombus noted in the proximal and mid femoral vein. Popliteal Vein: No evidence of thrombus. Normal compressibility, respiratory phasicity and response to augmentation. Calf Veins: No evidence of thrombus. Normal compressibility and flow on color Doppler imaging. Superficial Great Saphenous Vein: No evidence of thrombus. Normal compressibility. Venous Reflux:  None. Other Findings:  None. IMPRESSION: Diminutive right  femoral vein with nonocclusive thrombus in the proximal and mid femoral vein. It is difficult to determine if this is acute or chronic thrombus due to small size of the vein. Electronically Signed   By: Miachel Roux M.D.   On:  12:58   DG Chest Port 1 View  Result Date: 09/10/2022 CLINICAL DATA:  Sepsis, history of hepatitis EXAM: PORTABLE CHEST 1 VIEW COMPARISON:  10/14/2021 FINDINGS: Single frontal view of the chest demonstrates stable enlargement of the cardiac silhouette. Continued hiatal hernia. No airspace disease, effusion, or pneumothorax. No acute bony abnormality. IMPRESSION: 1. No acute intrathoracic process. 2. Stable hiatal hernia. Electronically Signed   By: Randa Ngo M.D.   On: 09/02/2022 21:18    Microbiology: Recent Results (from the past 240 hour(s))  Resp panel by RT-PCR (RSV, Flu A&B, Covid) Anterior Nasal Swab     Status: Abnormal   Collection Time: 08/27/2022  8:45 PM   Specimen: Anterior Nasal Swab  Result Value Ref Range Status   SARS Coronavirus 2 by RT PCR NEGATIVE NEGATIVE Final    Comment: (NOTE) SARS-CoV-2 target nucleic acids are NOT DETECTED.  The SARS-CoV-2 RNA is generally detectable in upper respiratory specimens during the acute phase of infection. The lowest concentration of SARS-CoV-2 viral copies this assay can detect is 138 copies/mL. A negative result does not preclude SARS-Cov-2 infection and should not be used as the sole basis for treatment or other patient management decisions. A negative result may occur with  improper specimen collection/handling, submission of specimen other than nasopharyngeal swab, presence of viral mutation(s)  within the areas targeted by this assay, and inadequate number of viral copies(<138 copies/mL). A negative result must be combined with clinical observations, patient history, and epidemiological information. The expected result is Negative.  Fact Sheet for Patients:   EntrepreneurPulse.com.au  Fact Sheet for Healthcare Providers:  IncredibleEmployment.be  This test is no t yet approved or cleared by the [place] FDA and  has been authorized for detection and/or diagnosis of SARS-CoV-2 by FDA under an Emergency Use Authorization (EUA). This EUA will remain  in effect (meaning this test can be used) for the duration of the COVID-19 declaration under Section 564(b)(1) of the Act, 21 U.S.C.section 360bbb-3(b)(1), unless the authorization is terminated  or revoked sooner.       Influenza A by PCR NEGATIVE NEGATIVE Final   Influenza B by PCR NEGATIVE NEGATIVE Final    Comment: (NOTE) The Xpert Xpress SARS-CoV-2/FLU/RSV plus assay is intended as an aid in the diagnosis of influenza from Nasopharyngeal swab specimens and should not be used as a sole basis for treatment. Nasal washings and aspirates are unacceptable for Xpert Xpress SARS-CoV-2/FLU/RSV testing.  Fact Sheet for Patients: EntrepreneurPulse.com.au  Fact Sheet for Healthcare Providers: IncredibleEmployment.be  This test is not yet approved or cleared by the [place] FDA and has been authorized for detection and/or diagnosis of SARS-CoV-2 by FDA under an Emergency Use Authorization (EUA). This EUA will remain in effect (meaning this test can be used) for the duration of the COVID-19 declaration under Section 564(b)(1) of the Act, 21 U.S.C. section 360bbb-3(b)(1), unless the authorization is terminated or revoked.     Resp Syncytial Virus by PCR POSITIVE (A) NEGATIVE Final    Comment: (NOTE) Fact Sheet for Patients: EntrepreneurPulse.com.au  Fact Sheet for Healthcare Providers: IncredibleEmployment.be  This test is not yet approved or cleared by the [place] FDA and has been authorized for detection and/or diagnosis of SARS-CoV-2 by FDA under an Emergency Use  Authorization (EUA). This EUA will remain in effect (meaning this test can be used) for the duration of the COVID-19 declaration under Section 564(b)(1) of the Act, 21 U.S.C. section 360bbb-3(b)(1), unless the authorization is terminated or revoked.  Performed at [facility], [address] [place], [place] [address]   Blood Culture (routine x 2)     Status: None (Preliminary result)   Collection Time: 08/27/2022  8:45 PM   Specimen: BLOOD  Result Value Ref Range Status   Specimen Description BLOOD BLOOD LEFT ARM  Final   Special Requests   Final    BOTTLES DRAWN AEROBIC AND ANAEROBIC Blood Culture results may not be optimal due to an inadequate volume of blood received in culture bottles   Culture   Final    NO GROWTH 3 DAYS Performed at [facility], [address] [name], [place] [address]    Report Status PENDING  Incomplete  Blood Culture (routine x 2)     Status: None (Preliminary result)   Collection Time: 08/24/2022  8:46 PM   Specimen: BLOOD  Result Value Ref Range Status   Specimen Description BLOOD BLOOD RIGHT ARM  Final   Special Requests   Final    BOTTLES DRAWN AEROBIC AND ANAEROBIC Blood Culture results may not be optimal due to an inadequate volume of blood received in culture bottles   Culture   Final    NO GROWTH 3 DAYS Performed at [facility], [address] [place], [place] [address]    Report Status PENDING  Incomplete  C Difficile Quick Screen w PCR reflex     Status: None   Collection Time: 09/14/22  4:20 PM   Specimen: STOOL  Result Value Ref Range Status   C Diff antigen NEGATIVE NEGATIVE Final   C Diff toxin NEGATIVE NEGATIVE Final   C Diff interpretation No C. difficile detected.  Final    Comment: Performed at [facility], [address] [place], [place] [address]     Signed: Loletha Grayer, MD

## 2022-10-17 DEATH — deceased
